# Patient Record
Sex: Female | Born: 1945
Health system: Southern US, Community
[De-identification: ages and names within clinical notes are randomized; demographics above are authoritative.]

## PROBLEM LIST (undated history)

## (undated) DIAGNOSIS — F329 Major depressive disorder, single episode, unspecified: Secondary | ICD-10-CM

## (undated) DIAGNOSIS — M1990 Unspecified osteoarthritis, unspecified site: Secondary | ICD-10-CM

## (undated) DIAGNOSIS — K219 Gastro-esophageal reflux disease without esophagitis: Secondary | ICD-10-CM

## (undated) DIAGNOSIS — E039 Hypothyroidism, unspecified: Secondary | ICD-10-CM

## (undated) DIAGNOSIS — F32A Depression, unspecified: Secondary | ICD-10-CM

## (undated) HISTORY — PX: CYST EXCISION: SHX5701

## (undated) HISTORY — PX: THYROID LOBECTOMY: SHX420

---

## 2001-05-22 ENCOUNTER — Encounter: Admission: RE | Admit: 2001-05-22 | Discharge: 2001-05-22 | Payer: Self-pay | Admitting: Family Medicine

## 2001-05-22 ENCOUNTER — Encounter: Payer: Self-pay | Admitting: Family Medicine

## 2002-05-31 ENCOUNTER — Encounter: Payer: Self-pay | Admitting: Orthopedic Surgery

## 2002-05-31 ENCOUNTER — Encounter: Admission: RE | Admit: 2002-05-31 | Discharge: 2002-05-31 | Payer: Self-pay | Admitting: Orthopedic Surgery

## 2002-07-13 ENCOUNTER — Encounter: Payer: Self-pay | Admitting: Family Medicine

## 2002-07-13 ENCOUNTER — Ambulatory Visit (HOSPITAL_COMMUNITY): Admission: RE | Admit: 2002-07-13 | Discharge: 2002-07-13 | Payer: Self-pay | Admitting: Family Medicine

## 2002-07-13 ENCOUNTER — Encounter (INDEPENDENT_AMBULATORY_CARE_PROVIDER_SITE_OTHER): Payer: Self-pay | Admitting: *Deleted

## 2002-09-23 ENCOUNTER — Encounter: Payer: Self-pay | Admitting: General Surgery

## 2002-09-29 ENCOUNTER — Encounter (INDEPENDENT_AMBULATORY_CARE_PROVIDER_SITE_OTHER): Payer: Self-pay | Admitting: *Deleted

## 2002-09-29 ENCOUNTER — Observation Stay (HOSPITAL_COMMUNITY): Admission: RE | Admit: 2002-09-29 | Discharge: 2002-09-30 | Payer: Self-pay | Admitting: General Surgery

## 2003-03-03 ENCOUNTER — Ambulatory Visit (HOSPITAL_COMMUNITY): Admission: RE | Admit: 2003-03-03 | Discharge: 2003-03-03 | Payer: Self-pay | Admitting: Family Medicine

## 2006-10-08 ENCOUNTER — Encounter: Admission: RE | Admit: 2006-10-08 | Discharge: 2006-10-08 | Payer: Self-pay | Admitting: Family Medicine

## 2010-06-01 NOTE — H&P (Signed)
NAME:  Joanne Reese, Joanne Reese                     ACCOUNT NO.:  [id number]   MEDICAL RECORD NO.:  [id number]                   PATIENT TYPE:  AMB   LOCATION:  DAY                                  FACILITY:  [facility]   PHYSICIAN:  Anselm Pancoast. Zachery Dakins, M.D.          DATE OF BIRTH:  07-Jul-1945   DATE OF ADMISSION:  09/29/2002  DATE OF DISCHARGE:                                HISTORY & PHYSICAL   CHIEF COMPLAINT:  Right thyroid nodule.   HISTORY:  Joanne Reese is a 65 year old Caucasian female referred to me  by Dr. Smith Mince after she had presented there approximately two months  earlier with an enlarge lymph node in the submandibular area and was  concerned about this because she has a sister who has had a multiple myeloma  that originated with a neck mass, and then she has also had a brother  recently that died with a malignancy that started in the lymph nodes.  Dr.  Smith Mince saw the patient and according to the patient, did not feel  anything definitely abnormal, but when ahead and ordered MRI and laboratory  studies.  The MRI done subsequently did not show any enlarged lymph nodes  but it did show a definite mass within the thyroid.  Then she had a thyroid  scan which showed a cold nodule in the right thyroid.  She also had an  ultrasound of the thyroid and then an aspiration cytology was performed,  fine needle, that showed __________ cells.  I saw the patient about the  midportion of July and discussed with her that with this finding she would  need a thyroid surgery.  She, because of work responsibilities and also some  family responsibilities, desired to wait until this time and was scheduled  electively.  The patient has had no history of radiation exposure to her  neck.  Otherwise, has been in good health __________.   REVIEW OF SYSTEMS:  Essentially negative.   PAST SURGICAL HISTORY:  She has had a previous cyst on her ovary removed.   ALLERGIES:  Says that she has had  an allergic reaction to MACROBID.   MEDICATIONS:  She is on an anti-inflammatory medicine for fasciitis.    The details as far as her past review of systems, etc., are all enclosed in  the chart from our office.   PHYSICAL EXAMINATION:  GENERAL:  The patient is an average sized, pleasant  Caucasian female in no acute distress.  VITAL SIGNS:  Temperature 98.1, pulse 64, respirations 16, blood pressure  108/64.  HEENT:  Appears adequately hydrated.  Tongue midline.  No oropharyngeal  lesions.  NECK:  No cervical or supraclavicular lymphadenopathy.  Can feel a  prominence in the right lobe of the thyroid.  There is not any enlarged  lymph nodes that I can appreciate.  LUNGS:  Clear.  BREASTS:  Normal.  CARDIAC:  Normal sinus rhythm.  ABDOMEN:  I do not appreciate  any abdominal organomegaly.   IMPRESSION:  Neoplasm right lobe of the thyroid, hopefully benign and  possibly a Hashimoto's thyroiditis according to the cytology reports.  I  discussed this with Dr. Clelia Croft.   PLAN:  The patient understands that we will do a right thyroid lobectomy.  She is aware of the recurrent laryngeal nerve and function of the  parathyroid glands because of her sister's total thyroidectomy and the  problems with calcium metabolism postoperatively, but is doing  satisfactorily now.  The patient will be given 1 g of Kefzol and she is  aware that she will be placed on thyroid replacement postoperatively  regardless of the pathology findings.  Hopefully it will not be necessary to  do a total thyroidectomy.                                               Anselm Pancoast. Zachery Dakins, M.D.    WJW/MEDQ  D:  09/29/2002  T:  09/29/2002  Job:  [id number]

## 2010-06-01 NOTE — Op Note (Signed)
NAME:  Joanne Reese, Joanne Reese                     ACCOUNT NO.:  [id number]   MEDICAL RECORD NO.:  [id number]                   PATIENT TYPE:  AMB   LOCATION:  DAY                                  FACILITY:  [facility]   PHYSICIAN:  Anselm Pancoast. Zachery Dakins, M.D.          DATE OF BIRTH:  1945-04-28   DATE OF PROCEDURE:  09/29/2002  DATE OF DISCHARGE:                                 OPERATIVE REPORT   PREOPERATIVE DIAGNOSIS:  Right thyroid nodule, possibly a Hurthle cell  tumor.   POSTOPERATIVE DIAGNOSES:  1. Right thyroid Hurthle cell tumor, benign.  2. Probable Hashimoto's thyroiditis.   OPERATION:  Right thyroid lobectomy.   General anesthesia.   SURGEON:  Anselm Pancoast. Zachery Dakins, M.D.   ASSISTANT:  Gita Kudo, M.D.   HISTORY:  Joanne Reese is a 65 year old female referred to me by  Talmadge Coventry, M.D., for enlargement of her right thyroid gland.  She  grew up in [place] state, is followed by Dr. Smith Mince, and recently she  noticed a little lump in her neck.  Her family history is significant in  that a brother recently died with problems of a lymphoma, and she also has a  sister that had multiple myeloma which presented with swollen lymph nodes,  and she saw Dr. Smith Mince when she felt the little node in the left  submandibular area, and Dr. Smith Mince said that she could not feel anything  definitely abnormal but ordered an MRI of the head and neck, and this showed  an enlarged thyroid mass.  Then she had a thyroid scan and thyroid  ultrasound, which showed a mass of the right lobe of the thyroid, and then  she was referred for an aspiration biopsy, and this was read by Berneta Levins,  M.D., that appeared to be Hurthle cells.  I then saw the patient and this  was about a month ago, and on physical exam you could definitely feel a firm  mass of the right lobe of her thyroid.  It was not very prominent but  definitely abnormal.  I did not appreciate any large lymph nodes,  and I  discussed with the patient with these findings that we should proceed on  with a right thyroid lobectomy.  I discussed this with Dr. Clelia Croft, and she  thought it was very unlikely that this would be anything like a lymphoma, as  the sister's surgery for original thyroid mass had presented.  The patient  had family responsibilities and was it was postponed now for the planned  surgery.  We discussed with her about placing her on thyroid replacement,  but I do not think she has actually been started on it at this time.   The patient was taken to the operative suite.  We positioned her on the OR  table after induction of general anesthesia with endotracheal tube, I had  given her 1 g of Kefzol, and I had placed a  roll up under the upper back.  I  made sure her neck was symmetrical, and then she was draped in a sterile  manner after prep of the neck with Betadine surgical scrub and solution and  drape in a sterile manner.  I marked the skin about two fingerbreadths above  the manubrium.  It appears that her gland is a little higher than some, and  the incision is maybe a little bit higher than some.  The skin incision was  opened about two inches, sharp dissection through the skin and subcutaneous  and the platysma, and this was elevated from the strap muscles, and a few  little bleeders were either cauterized or some were tied or ligated with  fine Vicryl sutures.  Then a Mahorner retractor was placed.  We then  separated the strap muscles in the midline and noted that the right lobe  was, the isthmus was a little bit to the left of midline and the mass was  obviously kind of in the body in the right pole but kind of going posterior.  I first noted that on trying to separate the thyroid from the surrounding  structures, there was a lot more sticky or kind of a more fibrosis than  usual, but we continued kind of separating everything carefully.  The little  blood vessels were either  ligated with fine silk or possibly fine Vicryl, a  few of them actually with small hemoclips.  I then basically separated the  superior pole structures.  This was doubly ligated distally with 2-0 silk,  inferior with single 2-0 silk, and then the inferior lobe, we saw the  branches coming off the inferior thyroid artery as they went right into the  thyroid gland, and this was divided at this level.  The inferior parathyroid  gland was identified and as we were dissecting and identifying this, we then  noted the right recurrent laryngeal nerve.  We then rolling the thyroid  gland, I had switched to the left side for better exposure, and we continued  kind of rolling the gland, carefully dissecting with the inferior  parathyroid gland exposed and then identified the superior, and then with  making sure that the right recurrent laryngeal nerve was not being injured,  kind of separated the gland from the structures and followed the nerve going  to the horn of the larynx.  There was, basically you just could not peel it  away, and we carefully dissected and careful hemostasis, and then we went  ahead and divided the isthmus, and I tied the left with a 2-0 Vicryl suture  and then a second tie, a free tie.  Then with this, the isthmus divided,  sort of elevating the thyroid from the larynx and carefully freed it.  The  mass itself was probably about 2-3 cm in size, and it was well within the  lobe, and this was read by Dr. Laureen Ochs, and he says it is a Hurthle cell tumor  but thinks it is benign, but it does appear that she has Hashimoto's  thyroiditis in the surrounding thyroid tissue.  The left thyroid lobe we  elevated the strap muscles over it.  It does not have the inflammation  surrounding it like the right, and the portion of the lobe that we could  easily feel, no definite nodularity, and it looked normal on the scans.  Therefore, with a benign Hurthle cell diagnosis and a normal-appearing  gland and the test had  been normal, we elected not to do a left thyroid lobectomy.  Good hemostasis had been obtained.  I reinspected the area.  I then closed  the strap muscle with interrupted sutures of 4-0 Vicryl, the platysma was  closed with interrupted 4-0 Vicryl, and 4-0 Monocryl subcuticular  interrupted stitches and then Steri-Strips on the skin.  The patient  tolerated the procedure nicely and the sterile occlusive dressing was  applied and she was taken to the recovery room in stable postoperative  condition, extubated.  Hopefully, she will have no problems with her voice,  and calcium should be fine, and we will start her on Synthroid replacement.                                                 Anselm Pancoast. Zachery Dakins, M.D.    WJW/MEDQ  D:  09/29/2002  T:  09/29/2002  Job:  [id number]   cc:   Talmadge Coventry, M.D.  [address]  [place]  [place] [address]  Fax: [phone]

## 2013-02-02 LAB — HM COLONOSCOPY

## 2013-12-01 ENCOUNTER — Other Ambulatory Visit: Payer: Self-pay | Admitting: Orthopedic Surgery

## 2013-12-01 DIAGNOSIS — M25562 Pain in left knee: Secondary | ICD-10-CM

## 2013-12-01 DIAGNOSIS — R531 Weakness: Secondary | ICD-10-CM

## 2013-12-01 DIAGNOSIS — M48061 Spinal stenosis, lumbar region without neurogenic claudication: Secondary | ICD-10-CM

## 2013-12-06 ENCOUNTER — Ambulatory Visit
Admission: RE | Admit: 2013-12-06 | Discharge: 2013-12-06 | Disposition: A | Discharge status: Home / Self Care | Payer: 59 | Source: Ambulatory Visit | Attending: Orthopedic Surgery | Admitting: Orthopedic Surgery

## 2013-12-06 DIAGNOSIS — M48061 Spinal stenosis, lumbar region without neurogenic claudication: Secondary | ICD-10-CM

## 2013-12-06 DIAGNOSIS — M25562 Pain in left knee: Secondary | ICD-10-CM

## 2013-12-06 DIAGNOSIS — R531 Weakness: Secondary | ICD-10-CM

## 2014-06-14 DIAGNOSIS — E0590 Thyrotoxicosis, unspecified without thyrotoxic crisis or storm: Secondary | ICD-10-CM | POA: Diagnosis not present

## 2014-06-21 DIAGNOSIS — E890 Postprocedural hypothyroidism: Secondary | ICD-10-CM | POA: Diagnosis not present

## 2014-06-21 DIAGNOSIS — Z808 Family history of malignant neoplasm of other organs or systems: Secondary | ICD-10-CM | POA: Diagnosis not present

## 2014-06-28 DIAGNOSIS — M25562 Pain in left knee: Secondary | ICD-10-CM | POA: Diagnosis not present

## 2014-06-28 DIAGNOSIS — E780 Pure hypercholesterolemia: Secondary | ICD-10-CM | POA: Diagnosis not present

## 2014-06-28 DIAGNOSIS — R030 Elevated blood-pressure reading, without diagnosis of hypertension: Secondary | ICD-10-CM | POA: Diagnosis not present

## 2014-06-28 DIAGNOSIS — R7309 Other abnormal glucose: Secondary | ICD-10-CM | POA: Diagnosis not present

## 2014-06-28 DIAGNOSIS — M85852 Other specified disorders of bone density and structure, left thigh: Secondary | ICD-10-CM | POA: Diagnosis not present

## 2014-06-28 DIAGNOSIS — E039 Hypothyroidism, unspecified: Secondary | ICD-10-CM | POA: Diagnosis not present

## 2014-06-28 DIAGNOSIS — Z13820 Encounter for screening for osteoporosis: Secondary | ICD-10-CM | POA: Diagnosis not present

## 2014-07-11 DIAGNOSIS — M25562 Pain in left knee: Secondary | ICD-10-CM | POA: Diagnosis not present

## 2014-11-10 DIAGNOSIS — R030 Elevated blood-pressure reading, without diagnosis of hypertension: Secondary | ICD-10-CM | POA: Diagnosis not present

## 2014-11-10 DIAGNOSIS — E039 Hypothyroidism, unspecified: Secondary | ICD-10-CM | POA: Diagnosis not present

## 2014-11-10 DIAGNOSIS — Z1389 Encounter for screening for other disorder: Secondary | ICD-10-CM | POA: Diagnosis not present

## 2014-11-10 DIAGNOSIS — F430 Acute stress reaction: Secondary | ICD-10-CM | POA: Diagnosis not present

## 2014-12-27 DIAGNOSIS — Z1231 Encounter for screening mammogram for malignant neoplasm of breast: Secondary | ICD-10-CM | POA: Diagnosis not present

## 2015-02-01 DIAGNOSIS — Z0000 Encounter for general adult medical examination without abnormal findings: Secondary | ICD-10-CM | POA: Diagnosis not present

## 2015-02-01 DIAGNOSIS — Z23 Encounter for immunization: Secondary | ICD-10-CM | POA: Diagnosis not present

## 2015-02-01 DIAGNOSIS — E039 Hypothyroidism, unspecified: Secondary | ICD-10-CM | POA: Diagnosis not present

## 2015-02-01 DIAGNOSIS — E559 Vitamin D deficiency, unspecified: Secondary | ICD-10-CM | POA: Diagnosis not present

## 2015-02-01 DIAGNOSIS — E785 Hyperlipidemia, unspecified: Secondary | ICD-10-CM | POA: Diagnosis not present

## 2015-02-01 DIAGNOSIS — R030 Elevated blood-pressure reading, without diagnosis of hypertension: Secondary | ICD-10-CM | POA: Diagnosis not present

## 2015-02-01 DIAGNOSIS — R7309 Other abnormal glucose: Secondary | ICD-10-CM | POA: Diagnosis not present

## 2015-02-07 DIAGNOSIS — J069 Acute upper respiratory infection, unspecified: Secondary | ICD-10-CM | POA: Diagnosis not present

## 2015-02-09 DIAGNOSIS — H2513 Age-related nuclear cataract, bilateral: Secondary | ICD-10-CM | POA: Diagnosis not present

## 2015-02-15 DIAGNOSIS — Z01419 Encounter for gynecological examination (general) (routine) without abnormal findings: Secondary | ICD-10-CM | POA: Diagnosis not present

## 2015-02-15 DIAGNOSIS — Z809 Family history of malignant neoplasm, unspecified: Secondary | ICD-10-CM | POA: Diagnosis not present

## 2015-02-24 DIAGNOSIS — R51 Headache: Secondary | ICD-10-CM | POA: Diagnosis not present

## 2015-03-01 ENCOUNTER — Other Ambulatory Visit: Payer: Self-pay | Admitting: Internal Medicine

## 2015-03-01 DIAGNOSIS — R519 Headache, unspecified: Secondary | ICD-10-CM

## 2015-03-01 DIAGNOSIS — R51 Headache: Principal | ICD-10-CM

## 2015-03-10 ENCOUNTER — Ambulatory Visit
Admission: RE | Admit: 2015-03-10 | Discharge: 2015-03-10 | Disposition: A | Discharge status: Home / Self Care | Payer: Self-pay | Source: Ambulatory Visit | Attending: Internal Medicine | Admitting: Internal Medicine

## 2015-03-10 DIAGNOSIS — R51 Headache: Principal | ICD-10-CM

## 2015-03-10 DIAGNOSIS — R519 Headache, unspecified: Secondary | ICD-10-CM

## 2015-05-15 DIAGNOSIS — E039 Hypothyroidism, unspecified: Secondary | ICD-10-CM | POA: Diagnosis not present

## 2015-05-15 DIAGNOSIS — R030 Elevated blood-pressure reading, without diagnosis of hypertension: Secondary | ICD-10-CM | POA: Diagnosis not present

## 2015-05-15 DIAGNOSIS — R42 Dizziness and giddiness: Secondary | ICD-10-CM | POA: Diagnosis not present

## 2015-05-15 DIAGNOSIS — R7309 Other abnormal glucose: Secondary | ICD-10-CM | POA: Diagnosis not present

## 2015-06-27 DIAGNOSIS — E890 Postprocedural hypothyroidism: Secondary | ICD-10-CM | POA: Diagnosis not present

## 2015-06-29 DIAGNOSIS — Z808 Family history of malignant neoplasm of other organs or systems: Secondary | ICD-10-CM | POA: Diagnosis not present

## 2015-06-29 DIAGNOSIS — E890 Postprocedural hypothyroidism: Secondary | ICD-10-CM | POA: Diagnosis not present

## 2015-07-20 DIAGNOSIS — E785 Hyperlipidemia, unspecified: Secondary | ICD-10-CM | POA: Diagnosis not present

## 2015-07-20 DIAGNOSIS — M25562 Pain in left knee: Secondary | ICD-10-CM | POA: Diagnosis not present

## 2015-07-20 DIAGNOSIS — R030 Elevated blood-pressure reading, without diagnosis of hypertension: Secondary | ICD-10-CM | POA: Diagnosis not present

## 2015-07-20 DIAGNOSIS — Z79899 Other long term (current) drug therapy: Secondary | ICD-10-CM | POA: Diagnosis not present

## 2015-10-02 DIAGNOSIS — Z23 Encounter for immunization: Secondary | ICD-10-CM | POA: Diagnosis not present

## 2015-12-21 DIAGNOSIS — R599 Enlarged lymph nodes, unspecified: Secondary | ICD-10-CM | POA: Diagnosis not present

## 2016-02-20 DIAGNOSIS — R7309 Other abnormal glucose: Secondary | ICD-10-CM | POA: Diagnosis not present

## 2016-02-20 DIAGNOSIS — Z79899 Other long term (current) drug therapy: Secondary | ICD-10-CM | POA: Diagnosis not present

## 2016-02-20 DIAGNOSIS — E559 Vitamin D deficiency, unspecified: Secondary | ICD-10-CM | POA: Diagnosis not present

## 2016-02-20 DIAGNOSIS — E890 Postprocedural hypothyroidism: Secondary | ICD-10-CM | POA: Diagnosis not present

## 2016-02-20 DIAGNOSIS — Z0000 Encounter for general adult medical examination without abnormal findings: Secondary | ICD-10-CM | POA: Diagnosis not present

## 2016-02-27 DIAGNOSIS — Z8041 Family history of malignant neoplasm of ovary: Secondary | ICD-10-CM | POA: Diagnosis not present

## 2016-02-27 DIAGNOSIS — Z01419 Encounter for gynecological examination (general) (routine) without abnormal findings: Secondary | ICD-10-CM | POA: Diagnosis not present

## 2016-02-27 DIAGNOSIS — L03113 Cellulitis of right upper limb: Secondary | ICD-10-CM | POA: Diagnosis not present

## 2016-02-27 DIAGNOSIS — Z780 Asymptomatic menopausal state: Secondary | ICD-10-CM | POA: Diagnosis not present

## 2016-02-27 DIAGNOSIS — Z809 Family history of malignant neoplasm, unspecified: Secondary | ICD-10-CM | POA: Diagnosis not present

## 2016-03-11 DIAGNOSIS — S0083XA Contusion of other part of head, initial encounter: Secondary | ICD-10-CM | POA: Diagnosis not present

## 2016-03-11 DIAGNOSIS — S92812A Other fracture of left foot, initial encounter for closed fracture: Secondary | ICD-10-CM | POA: Diagnosis not present

## 2016-03-15 ENCOUNTER — Ambulatory Visit (INDEPENDENT_AMBULATORY_CARE_PROVIDER_SITE_OTHER): Payer: Medicare Other

## 2016-03-15 ENCOUNTER — Ambulatory Visit (INDEPENDENT_AMBULATORY_CARE_PROVIDER_SITE_OTHER): Payer: Medicare Other | Admitting: Orthopedic Surgery

## 2016-03-15 DIAGNOSIS — M1712 Unilateral primary osteoarthritis, left knee: Secondary | ICD-10-CM

## 2016-03-15 DIAGNOSIS — S92355A Nondisplaced fracture of fifth metatarsal bone, left foot, initial encounter for closed fracture: Secondary | ICD-10-CM | POA: Diagnosis not present

## 2016-03-15 DIAGNOSIS — M25572 Pain in left ankle and joints of left foot: Secondary | ICD-10-CM

## 2016-03-15 MED ORDER — LIDOCAINE HCL 1 % IJ SOLN
5.0000 mL | INTRAMUSCULAR | Status: AC | PRN
  Administered 2016-03-15: 5 mL

## 2016-03-15 MED ORDER — BUPIVACAINE HCL 0.25 % IJ SOLN
4.0000 mL | INTRAMUSCULAR | Status: AC | PRN
  Administered 2016-03-15: 4 mL via INTRA_ARTICULAR

## 2016-03-15 MED ORDER — METHYLPREDNISOLONE ACETATE 40 MG/ML IJ SUSP
40.0000 mg | INTRAMUSCULAR | Status: AC | PRN
  Administered 2016-03-15: 40 mg via INTRA_ARTICULAR

## 2016-03-15 NOTE — Progress Notes (Signed)
Office Visit Note   Patient: Joanne Reese           Date of Birth: 10/16/1945           MRN: [id number] Visit Date: 03/15/2016 Requested by: Glendale Chard, MD [address] [place], [place] [address] PCP: [name], MD  Subjective: No chief complaint on file.   HPI: Joanne Reese is a 71 year old patient who was standing on a rolling chair and fell 6 days ago.  Face became bruised but her foot was evaluated after she had difficulty weightbearing and she was found to have a fifth metatarsal fracture.  She's been walking in a cam boot.  Taking Advil for pain.  She also describes left knee pain.  She's getting ready to visit her son in [place] on the 20th.  She presents now for further evaluation and management.              ROS: All systems reviewed are negative as they relate to the chief complaint within the history of present illness.  Patient denies  fevers or chills.   Assessment & Plan: Visit Diagnoses:  1. Pain in left ankle and joints of left foot     Plan: Impression is left knee arthritis.  Injection performed today.  She also has minimally displaced fifth metatarsal fracture which should respond well to continue nonoperative management.  Continue to weight-bear in a relate as tolerated in the fracture boot.  When out of the fracture boot she should not really put any weight on that distal foot.  I see her back on the 19th with repeat radiographs possible changeover to harder sole shoes at that time.  Follow-Up Instructions: No Follow-up on file.   Orders:  Orders Placed This Encounter  Procedures  . XR Foot Complete Left   No orders of the defined types were placed in this encounter.     Procedures: Large Joint Inj Date/Time: 03/15/2016 9:09 AM Performed by: Meredith Pel Authorized by: Meredith Pel   Consent Given by:  Patient Site marked: the procedure site was marked   Timeout: prior to procedure the correct patient, procedure,  and site was verified   Indications:  Pain, joint swelling and diagnostic evaluation Location:  Knee Site:  L knee Prep: patient was prepped and draped in usual sterile fashion   Needle Size:  18 G Needle Length:  1.5 inches Approach:  Superolateral Ultrasound Guidance: No   Fluoroscopic Guidance: No   Arthrogram: No   Medications:  5 mL lidocaine 1 %; 4 mL bupivacaine 0.25 %; 40 mg methylPREDNISolone acetate 40 MG/ML Patient tolerance:  Patient tolerated the procedure well with no immediate complications     Clinical Data: No additional findings.  Objective: Vital Signs: There were no vitals taken for this visit.  Physical Exam:   Constitutional: Patient appears well-developed HEENT:  Head: Normocephalic Eyes:EOM are normal Neck: Normal range of motion Cardiovascular: Normal rate Pulmonary/chest: Effort normal Neurologic: Patient is alert Skin: Skin is warm Psychiatric: Patient has normal mood and affect    Ortho Exam: Orthopedic exam demonstrates some bruising and tenderness at the fifth metatarsal region on the left foot.  Pedal pulses palpable.  Ankle dorsi and plantar flexion is intact.  No other masses lymph adenopathy or skin changes noted in the left foot region.  Compartments otherwise normal.  No tenderness at the tarsometatarsal junction.  Specialty Comments:  No specialty comments available.  Imaging: Xr Foot Complete Left  Result  Date: 03/15/2016 3 views left foot reviewed.  Spurring off the calcaneus noted on the plantar surface.  Mild degenerative changes in the midfoot present.  Fifth metatarsal fracture present with minimal displacement.  Spiral type fracture extending to the head and neck region of the fifth metatarsal.  Lisfranc joint remains intact and nondisplaced    PMFS History: There are no active problems to display for this patient.  No past medical history on file.  No family history on file.  No past surgical history on file. Social  History   Occupational History  . Not on file.   Social History Main Topics  . Smoking status: Not on file  . Smokeless tobacco: Not on file  . Alcohol use Not on file  . Drug use: Unknown  . Sexual activity: Not on file

## 2016-03-19 DIAGNOSIS — M8588 Other specified disorders of bone density and structure, other site: Secondary | ICD-10-CM | POA: Diagnosis not present

## 2016-03-19 DIAGNOSIS — Z780 Asymptomatic menopausal state: Secondary | ICD-10-CM | POA: Diagnosis not present

## 2016-03-19 DIAGNOSIS — Z1231 Encounter for screening mammogram for malignant neoplasm of breast: Secondary | ICD-10-CM | POA: Diagnosis not present

## 2016-03-20 ENCOUNTER — Ambulatory Visit (INDEPENDENT_AMBULATORY_CARE_PROVIDER_SITE_OTHER): Payer: Self-pay | Admitting: Orthopedic Surgery

## 2016-04-01 ENCOUNTER — Ambulatory Visit (INDEPENDENT_AMBULATORY_CARE_PROVIDER_SITE_OTHER): Payer: Medicare Other

## 2016-04-01 ENCOUNTER — Encounter (INDEPENDENT_AMBULATORY_CARE_PROVIDER_SITE_OTHER): Payer: Self-pay | Admitting: Orthopedic Surgery

## 2016-04-01 ENCOUNTER — Ambulatory Visit (INDEPENDENT_AMBULATORY_CARE_PROVIDER_SITE_OTHER): Payer: Medicare Other | Admitting: Orthopedic Surgery

## 2016-04-01 DIAGNOSIS — M79672 Pain in left foot: Secondary | ICD-10-CM

## 2016-04-01 DIAGNOSIS — N839 Noninflammatory disorder of ovary, fallopian tube and broad ligament, unspecified: Secondary | ICD-10-CM | POA: Diagnosis not present

## 2016-04-01 DIAGNOSIS — Z8041 Family history of malignant neoplasm of ovary: Secondary | ICD-10-CM | POA: Diagnosis not present

## 2016-04-01 DIAGNOSIS — Z780 Asymptomatic menopausal state: Secondary | ICD-10-CM | POA: Diagnosis not present

## 2016-04-01 DIAGNOSIS — S92355D Nondisplaced fracture of fifth metatarsal bone, left foot, subsequent encounter for fracture with routine healing: Secondary | ICD-10-CM

## 2016-04-01 NOTE — Progress Notes (Signed)
   Post-Op Visit Note   Patient: Joanne Reese           Date of Birth: Sep 10, 1945           MRN: [id number] Visit Date: 04/01/2016 PCP: [name], MD   Assessment & Plan:  Chief Complaint:  Chief Complaint  Patient presents with  . Left Foot - Follow-up   Visit Diagnoses:  1. Left foot pain   2. Closed nondisplaced fracture of fifth metatarsal bone of left foot with routine healing, subsequent encounter     Plan: Joanne Reese is a patient who underwent left fifth metatarsal fracture several weeks ago.  She's been a relating with the fracture boot.  Doing well no problems the pain.  Radiographs of the left foot show no change in fracture alignment.  She's doing well following her knee injection 3-18.  Plan at this time 2 weeks in the boot 1 week in the shoe 3 weeks return if she has any recurrent symptoms  Follow-Up Instructions: No Follow-up on file.   Orders:  Orders Placed This Encounter  Procedures  . XR Foot Complete Left   No orders of the defined types were placed in this encounter.   Imaging: Xr Foot Complete Left  Result Date: 04/01/2016   Fifth metatarsal fracture has minimal to no change in fracture alignment.  Small amount of callus formation is present.  Overall alignment unchanged from previous x-rays   PMFS History: There are no active problems to display for this patient.  No past medical history on file.  No family history on file.  No past surgical history on file. Social History   Occupational History  . Not on file.   Social History Main Topics  . Smoking status: Never Smoker  . Smokeless tobacco: Never Used  . Alcohol use Not on file  . Drug use: Unknown  . Sexual activity: Not on file

## 2016-04-04 DIAGNOSIS — R05 Cough: Secondary | ICD-10-CM | POA: Diagnosis not present

## 2016-04-04 DIAGNOSIS — R911 Solitary pulmonary nodule: Secondary | ICD-10-CM | POA: Diagnosis not present

## 2016-04-04 DIAGNOSIS — J208 Acute bronchitis due to other specified organisms: Secondary | ICD-10-CM | POA: Diagnosis not present

## 2016-04-22 DIAGNOSIS — N6312 Unspecified lump in the right breast, upper inner quadrant: Secondary | ICD-10-CM | POA: Diagnosis not present

## 2016-04-22 DIAGNOSIS — N6310 Unspecified lump in the right breast, unspecified quadrant: Secondary | ICD-10-CM | POA: Diagnosis not present

## 2016-04-22 DIAGNOSIS — N6341 Unspecified lump in right breast, subareolar: Secondary | ICD-10-CM | POA: Diagnosis not present

## 2016-04-22 DIAGNOSIS — N6322 Unspecified lump in the left breast, upper inner quadrant: Secondary | ICD-10-CM | POA: Diagnosis not present

## 2016-04-22 DIAGNOSIS — N6489 Other specified disorders of breast: Secondary | ICD-10-CM | POA: Diagnosis not present

## 2016-04-22 DIAGNOSIS — N6320 Unspecified lump in the left breast, unspecified quadrant: Secondary | ICD-10-CM | POA: Diagnosis not present

## 2016-04-22 DIAGNOSIS — N6002 Solitary cyst of left breast: Secondary | ICD-10-CM | POA: Diagnosis not present

## 2016-04-30 DIAGNOSIS — M9902 Segmental and somatic dysfunction of thoracic region: Secondary | ICD-10-CM | POA: Diagnosis not present

## 2016-04-30 DIAGNOSIS — M9904 Segmental and somatic dysfunction of sacral region: Secondary | ICD-10-CM | POA: Diagnosis not present

## 2016-04-30 DIAGNOSIS — M9905 Segmental and somatic dysfunction of pelvic region: Secondary | ICD-10-CM | POA: Diagnosis not present

## 2016-04-30 DIAGNOSIS — M9903 Segmental and somatic dysfunction of lumbar region: Secondary | ICD-10-CM | POA: Diagnosis not present

## 2016-06-24 DIAGNOSIS — N839 Noninflammatory disorder of ovary, fallopian tube and broad ligament, unspecified: Secondary | ICD-10-CM | POA: Diagnosis not present

## 2016-06-24 DIAGNOSIS — Z8041 Family history of malignant neoplasm of ovary: Secondary | ICD-10-CM | POA: Diagnosis not present

## 2016-07-01 DIAGNOSIS — M25561 Pain in right knee: Secondary | ICD-10-CM | POA: Diagnosis not present

## 2016-07-01 DIAGNOSIS — E039 Hypothyroidism, unspecified: Secondary | ICD-10-CM | POA: Diagnosis not present

## 2016-07-01 DIAGNOSIS — E7800 Pure hypercholesterolemia, unspecified: Secondary | ICD-10-CM | POA: Diagnosis not present

## 2016-07-01 DIAGNOSIS — M1711 Unilateral primary osteoarthritis, right knee: Secondary | ICD-10-CM | POA: Diagnosis not present

## 2016-07-01 DIAGNOSIS — Z79899 Other long term (current) drug therapy: Secondary | ICD-10-CM | POA: Diagnosis not present

## 2016-07-01 DIAGNOSIS — Z7982 Long term (current) use of aspirin: Secondary | ICD-10-CM | POA: Diagnosis not present

## 2016-07-02 DIAGNOSIS — R2241 Localized swelling, mass and lump, right lower limb: Secondary | ICD-10-CM | POA: Diagnosis not present

## 2016-07-05 ENCOUNTER — Encounter (INDEPENDENT_AMBULATORY_CARE_PROVIDER_SITE_OTHER): Payer: Self-pay | Admitting: Orthopaedic Surgery

## 2016-07-05 ENCOUNTER — Ambulatory Visit (INDEPENDENT_AMBULATORY_CARE_PROVIDER_SITE_OTHER): Payer: Medicare Other | Admitting: Orthopaedic Surgery

## 2016-07-05 DIAGNOSIS — M25561 Pain in right knee: Secondary | ICD-10-CM

## 2016-07-05 NOTE — Addendum Note (Signed)
Addended byLaurann Montana on: 07/05/2016 09:41 AM   Modules accepted: Orders

## 2016-07-05 NOTE — Progress Notes (Signed)
Office Visit Note   Patient: Joanne Reese           Date of Birth: 02/16/1945           MRN: [id number] Visit Date: 07/05/2016              Requested by: Glendale Chard, [address] [place], [place] [address] PCP: Glendale Chard, MD   Assessment & Plan: Visit Diagnoses:  1. Acute pain of right knee     Plan: Right knee was aspirated and injected with cortisone. The fluid was sent off for analysis. If not better should consider MRI. Overall impression is degenerative meniscus tear.  Follow-Up Instructions: Return if symptoms worsen or fail to improve.   Orders:  No orders of the defined types were placed in this encounter.  No orders of the defined types were placed in this encounter.     Procedures: Large Joint Inj Date/Time: 07/05/2016 9:28 AM Performed by: Leandrew Koyanagi Authorized by: Leandrew Koyanagi   Consent Given by:  Patient Timeout: prior to procedure the correct patient, procedure, and site was verified   Indications:  Pain Location:  Knee Site:  R knee Prep: patient was prepped and draped in usual sterile fashion   Needle Size:  22 G Ultrasound Guidance: No   Fluoroscopic Guidance: No   Arthrogram: No   Aspirate amount (mL):  40 Patient tolerance:  Patient tolerated the procedure well with no immediate complications     Clinical Data: No additional findings.   Subjective: Chief Complaint  Patient presents with  . Right Knee - Pain    Joanne Reese comes in with right knee swelling and pain for last week or so. She was up in [place]. Had trouble walking. She is to the ER was ruled out for DVT. She did have a Baker's cyst. She is complaining of right knee pain that does not radiate. She does endorse swelling. Denies any injuries.    Review of Systems  Constitutional: Negative.   HENT: Negative.   Eyes: Negative.   Respiratory: Negative.   Cardiovascular: Negative.   Endocrine: Negative.   Musculoskeletal: Negative.     Neurological: Negative.   Hematological: Negative.   Psychiatric/Behavioral: Negative.   All other systems reviewed and are negative.    Objective: Vital Signs: There were no vitals taken for this visit.  Physical Exam  Constitutional: She is oriented to person, place, and time. She appears well-developed and well-nourished.  Pulmonary/Chest: Effort normal.  Neurological: She is alert and oriented to person, place, and time.  Skin: Skin is warm. Capillary refill takes less than 2 seconds.  Psychiatric: She has a normal mood and affect. Her behavior is normal. Judgment and thought content normal.  Nursing note and vitals reviewed.   Ortho Exam Right knee exam shows a moderate size effusion. She has painful range of motion. Collaterals and cruciate's are stable.  No significant joint line tenderness. Small Baker cyst Specialty Comments:  No specialty comments available.  Imaging: No results found.   PMFS History: There are no active problems to display for this patient.  No past medical history on file.  No family history on file.  No past surgical history on file. Social History   Occupational History  . Not on file.   Social History Main Topics  . Smoking status: Never Smoker  . Smokeless tobacco: Never Used  . Alcohol use Not on file  . Drug use: Unknown  . Sexual  activity: Not on file

## 2016-07-06 LAB — SYNOVIAL CELL COUNT + DIFF, W/ CRYSTALS
Basophils, %: 0 %
Eosinophils-Synovial: 0 % (ref 0–2)
Lymphocytes-Synovial Fld: 31 % (ref 0–74)
Monocyte/Macrophage: 60 % (ref 0–69)
Neutrophil, Synovial: 9 % (ref 0–24)
SYNOVIOCYTES, %: 0 % (ref 0–15)
WBC, SYNOVIAL: 493 {cells}/uL — AB (ref <150)

## 2016-07-06 NOTE — Progress Notes (Signed)
Please let her know she had a pseudogout flare up.

## 2016-07-07 DIAGNOSIS — R609 Edema, unspecified: Secondary | ICD-10-CM | POA: Diagnosis not present

## 2016-07-09 ENCOUNTER — Encounter (INDEPENDENT_AMBULATORY_CARE_PROVIDER_SITE_OTHER): Payer: Self-pay | Admitting: Orthopedic Surgery

## 2016-07-09 ENCOUNTER — Ambulatory Visit (INDEPENDENT_AMBULATORY_CARE_PROVIDER_SITE_OTHER): Payer: Medicare Other | Admitting: Orthopedic Surgery

## 2016-07-09 DIAGNOSIS — M25561 Pain in right knee: Secondary | ICD-10-CM

## 2016-07-09 MED ORDER — IBUPROFEN-FAMOTIDINE 800-26.6 MG PO TABS: 1.0000 | ORAL_TABLET | Freq: Every day | ORAL | 0 refills | Status: DC

## 2016-07-09 NOTE — Progress Notes (Signed)
   Office Visit Note   Patient: Joanne Reese           Date of Birth: [birth year]           MRN: [id number] Visit Date: 07/09/2016 Requested by: Glendale Chard, [address] [place], [place] [address] PCP: Glendale Chard, MD  Subjective: Chief Complaint  Patient presents with  . Right Knee - Pain    HPI: Joanne Reese is a 71 year old patient with right knee pain.  Last week she saw one of my partners had knee aspirated and injected.  30 mL were removed.  This did show pseudogout.  That acute pain is past but she is left with some medial sided pain consistent with her known history of early arthritis in that right knee.  She takes Advil which helps.  She leaves for [place] on Monday.              ROS: All systems reviewed are negative as they relate to the chief complaint within the history of present illness.  Patient denies  fevers or chills.   Assessment & Plan: Visit Diagnoses:  1. Acute pain of right knee     Plan: Impression is resolved pseudogout attack from cortisone injection but with persistent pain likely from degenerative arthritis affecting primarily the medial compartment.  Plan is for her to take Duexis 1 tablet daily for 9 days all she is over in [place].  Also going to give her a knee sleeve to wear which might help with her ambulatory ability and endurance.  She needs to avoid foods that may be provocative for pseudogout attacks.  I'll see her back as needed  Follow-Up Instructions: Return if symptoms worsen or fail to improve.   Orders:  No orders of the defined types were placed in this encounter.  Meds ordered this encounter  Medications  . Ibuprofen-Famotidine (DUEXIS) 800-26.6 MG TABS    Sig: Take 1 tablet by mouth daily.    Dispense:  90 tablet    Refill:  0      Procedures: No procedures performed   Clinical Data: No additional findings.  Objective: Vital Signs: There were no vitals taken for this visit.  Physical Exam:    Constitutional: Patient appears well-developed HEENT:  Head: Normocephalic Eyes:EOM are normal Neck: Normal range of motion Cardiovascular: Normal rate Pulmonary/chest: Effort normal Neurologic: Patient is alert Skin: Skin is warm Psychiatric: Patient has normal mood and affect    Ortho Exam: Orthopedic exam demonstrates full active and passive range of motion of the right knee.  Collateral and cruciate ligaments are stable.  No effusion is present.  Medial joint line tenderness is present.  Pedal pulses palpable.  Knee has only slight warmth right compared to left.  Negative McMurray compression testing.  Specialty Comments:  No specialty comments available.  Imaging: No results found.   PMFS History: There are no active problems to display for this patient.  No past medical history on file.  No family history on file.  No past surgical history on file. Social History   Occupational History  . Not on file.   Social History Main Topics  . Smoking status: Never Smoker  . Smokeless tobacco: Never Used  . Alcohol use Not on file  . Drug use: Unknown  . Sexual activity: Not on file

## 2016-07-18 ENCOUNTER — Telehealth (INDEPENDENT_AMBULATORY_CARE_PROVIDER_SITE_OTHER): Payer: Self-pay | Admitting: Orthopedic Surgery

## 2016-07-18 NOTE — Telephone Encounter (Signed)
Please advise. Thanks.  

## 2016-07-18 NOTE — Telephone Encounter (Signed)
Patient called advised she is in [place] and pseudo gout and is taking 800 mg of Ibuprofen which taking 1 a day is not working. Patient asked what can she take with it of can she take 1 Ibuprofen every 12 hours. The number to contact patient is [phone]

## 2016-07-18 NOTE — Telephone Encounter (Signed)
S/w patient and advised could take up to additional 800mg  ibuprofen with duexis that she is already taking and she is to make ROV to see Dr Marlou Sa upon return from her trip

## 2016-07-18 NOTE — Telephone Encounter (Signed)
Does she want gout medicine f or additional pain medicine

## 2016-07-18 NOTE — Telephone Encounter (Signed)
Patient called again concerning Rx for pain for pseudogout.

## 2016-08-05 ENCOUNTER — Ambulatory Visit (INDEPENDENT_AMBULATORY_CARE_PROVIDER_SITE_OTHER): Payer: Medicare Other | Admitting: Internal Medicine

## 2016-08-05 ENCOUNTER — Encounter: Payer: Self-pay | Admitting: Internal Medicine

## 2016-08-05 ENCOUNTER — Other Ambulatory Visit: Payer: Self-pay

## 2016-08-05 VITALS — BP 160/98 | HR 84 | Ht 63.0 in | Wt 195.0 lb

## 2016-08-05 DIAGNOSIS — I10 Essential (primary) hypertension: Secondary | ICD-10-CM | POA: Insufficient documentation

## 2016-08-05 DIAGNOSIS — R911 Solitary pulmonary nodule: Secondary | ICD-10-CM | POA: Diagnosis not present

## 2016-08-05 NOTE — Assessment & Plan Note (Signed)
Not optimally controlled on present regimen. I reviewed this with the patient and emphasized importance of follow-up with primary care.     

## 2016-08-05 NOTE — Assessment & Plan Note (Signed)
CT results reviewed with pt >>> Too small for PET or bx, not suspicious enough for excisional bx > really only option for now is follow the Fleischner society guidelines as rec by radiology  Discussed in detail all the  indications, usual  risks and alternatives  relative to the benefits with patient who agrees to proceed with conservative f/u as outlined  =  For symptoms only > no directed therapy indicated    Total time devoted to counseling  > 50 % of initial 45 min office visit:  review case with pt/ discussion of options/alternatives/ personally creating written customized instructions  in presence of pt  then going over those specific  Instructions directly with the pt including how to use all of the meds but in particular covering each new medication in detail and the difference between the maintenance= "automatic" meds and the prns using an action plan format for the latter (If this problem/symptom => do that organization reading Left to right).  Please see AVS from this visit for a full list of these instructions which I personally wrote for this pt and  are unique to this visit.

## 2016-08-05 NOTE — Progress Notes (Signed)
Subjective:     Patient ID: Joanne Reese, female   DOB: 07/15/1945,   MRN: [id number]  HPI  61 yowf never smoker freq travel to [facility] to visit son with onset of cough when boarded in [place] in March 2018  then couldn't stop coughing after arrival to [facility] and so several days after arriving cxr > CTa 04/04/16 due to wearing boot on arrival  > 4 mm so referred to pulmonary clinic 08/05/2016 by Dr   Robbie Lis in [place]   08/05/2016 1st Day Pulmonary office visit/ Joanne Reese   Chief Complaint  Patient presents with  . Pulmonary Consult    Self referral for eval of pulmonary nodule.  Pt states went to ER in [place] in 04/04/16 for eval of cough. CTA was done to r/o PE b/c of recent travel- neg for PE.    rx zpak/ tessalon and cough resolved within a few days   Not limited by breathing from desired activities    No obvious day to day or daytime variability or assoc excess/ purulent sputum or mucus plugs or hemoptysis or cp or chest tightness, subjective wheeze or overt sinus or hb symptoms. No unusual exp hx or h/o childhood pna/ asthma or knowledge of premature birth.  Sleeping ok without nocturnal  or early am exacerbation  of respiratory  c/o's or need for noct saba. Also denies any obvious fluctuation of symptoms with weather or environmental changes or other aggravating or alleviating factors except as outlined above   Current Medications, Allergies, Complete Past Medical History, Past Surgical History, Family History, and Social History were reviewed in [employer] record.  ROS  The following are not active complaints unless bolded sore throat, dysphagia, dental problems, itching, sneezing,  nasal congestion or excess/ purulent secretions, ear ache,   fever, chills, sweats, unintended wt loss, classically pleuritic or exertional cp,  orthopnea pnd or leg swelling, presyncope, palpitations, abdominal pain, anorexia, nausea, vomiting, diarrhea  or change in  bowel or bladder habits, change in stools or urine, dysuria,hematuria,  rash, arthralgias, visual complaints, headache, numbness, weakness or ataxia or problems with walking or coordination,  change in mood/affect since husband died from [place]  or memory.                Review of Systems     Objective:   Physical Exam    amb pleasant wf nad   Wt Readings from Last 3 Encounters:  08/05/16 195 lb (88.5 kg)  12/06/13 192 lb (87.1 kg)  12/06/13 192 lb (87.1 kg)    Vital signs reviewed  Oxygen saturation  98%  RA and note BP 160/98    HEENT: nl dentition, turbinates bilaterally, and oropharynx. Nl external ear canals without cough reflex   NECK :  without JVD/Nodes/TM/ nl carotid upstrokes bilaterally   LUNGS: no acc muscle use,  Nl contour chest which is clear to A and P bilaterally without cough on insp or exp maneuvers   CV:  RRR  no s3 or murmur or increase in P2, and no edema   ABD:  soft and nontender with nl inspiratory excursion in the supine position. No bruits or organomegaly appreciated, bowel sounds nl  MS:  Nl gait/ ext warm without deformities, calf tenderness, cyanosis or clubbing No obvious joint restrictions   SKIN: warm and dry without lesions    NEURO:  alert, approp, nl sensorium with  no motor or cerebellar deficits apparent.  I personally reviewed images and agree with radiology impression as follows:   Chest CTa  04/04/16 16mm LUL     Assessment:

## 2016-08-05 NOTE — Patient Instructions (Signed)
Fleischner Society recommendations for incidental pulmonary nodules for you = no more scans needed  Please call for cough/ chest pain with breathing or unexplained shortness of breath

## 2016-08-07 ENCOUNTER — Ambulatory Visit (INDEPENDENT_AMBULATORY_CARE_PROVIDER_SITE_OTHER): Payer: Medicare Other

## 2016-08-07 ENCOUNTER — Encounter (INDEPENDENT_AMBULATORY_CARE_PROVIDER_SITE_OTHER): Payer: Self-pay | Admitting: Orthopedic Surgery

## 2016-08-07 ENCOUNTER — Ambulatory Visit (INDEPENDENT_AMBULATORY_CARE_PROVIDER_SITE_OTHER): Payer: Medicare Other | Admitting: Orthopedic Surgery

## 2016-08-07 ENCOUNTER — Ambulatory Visit (INDEPENDENT_AMBULATORY_CARE_PROVIDER_SITE_OTHER): Payer: Self-pay

## 2016-08-07 DIAGNOSIS — L813 Cafe au lait spots: Secondary | ICD-10-CM | POA: Diagnosis not present

## 2016-08-07 DIAGNOSIS — D2261 Melanocytic nevi of right upper limb, including shoulder: Secondary | ICD-10-CM | POA: Diagnosis not present

## 2016-08-07 DIAGNOSIS — M25561 Pain in right knee: Secondary | ICD-10-CM | POA: Diagnosis not present

## 2016-08-07 DIAGNOSIS — G8929 Other chronic pain: Secondary | ICD-10-CM

## 2016-08-07 DIAGNOSIS — D692 Other nonthrombocytopenic purpura: Secondary | ICD-10-CM | POA: Diagnosis not present

## 2016-08-07 DIAGNOSIS — D2272 Melanocytic nevi of left lower limb, including hip: Secondary | ICD-10-CM | POA: Diagnosis not present

## 2016-08-07 DIAGNOSIS — L738 Other specified follicular disorders: Secondary | ICD-10-CM | POA: Diagnosis not present

## 2016-08-07 DIAGNOSIS — D2271 Melanocytic nevi of right lower limb, including hip: Secondary | ICD-10-CM | POA: Diagnosis not present

## 2016-08-07 DIAGNOSIS — M25562 Pain in left knee: Secondary | ICD-10-CM

## 2016-08-07 DIAGNOSIS — D225 Melanocytic nevi of trunk: Secondary | ICD-10-CM | POA: Diagnosis not present

## 2016-08-07 DIAGNOSIS — L821 Other seborrheic keratosis: Secondary | ICD-10-CM | POA: Diagnosis not present

## 2016-08-07 DIAGNOSIS — L814 Other melanin hyperpigmentation: Secondary | ICD-10-CM | POA: Diagnosis not present

## 2016-08-09 NOTE — Progress Notes (Signed)
Office Visit Note   Patient: Joanne Reese           Date of Birth: [birth year]           MRN: [id number] Visit Date: 08/07/2016 Requested by: Glendale Chard, [address] [place], [place] [address] PCP: Glendale Chard, MD  Subjective: Chief Complaint  Patient presents with  . Right Knee - Follow-up    HPI: Joanne Reese is a 71 year old patient with right knee pain.  She has been taking 2 axis daily.  She's doing a little bit better than she was in [place].  She is having burning pain in the medial aspect of the right knee.  If she walks for any period time it swells.  Not having much in way of left knee pain.  Support hose that she's been wearing have been painful.  She had a cortisone injection prior to her it [place] trip.  This was done in the right knee.              ROS: All systems reviewed are negative as they relate to the chief complaint within the history of present illness.  Patient denies  fevers or chills.   Assessment & Plan: Visit Diagnoses:  1. Chronic pain of both knees     Plan: Impression is symptomatic right knee medial compartment arthritis with patellofemoral arthritis also noted on plain radiographs.  Patient also has some early lateral compartment arthritis in the left knee.  Plan is to pre-approve for gel injections in both knees.  We will see how that works for her.  I think she is heading for some type of arthroplasty procedure in the right knee.  She does have global pain in the right knee so it would likely be a total knee replacement.  Follow-Up Instructions: No Follow-up on file.   Orders:  Orders Placed This Encounter  Procedures  . XR KNEE 3 VIEW RIGHT  . XR KNEE 3 VIEW LEFT   No orders of the defined types were placed in this encounter.     Procedures: No procedures performed   Clinical Data: No additional findings.  Objective: Vital Signs: There were no vitals taken for this visit.  Physical Exam:   Constitutional:  Patient appears well-developed HEENT:  Head: Normocephalic Eyes:EOM are normal Neck: Normal range of motion Cardiovascular: Normal rate Pulmonary/chest: Effort normal Neurologic: Patient is alert Skin: Skin is warm Psychiatric: Patient has normal mood and affect    Ortho Exam: Orthopedic exam demonstrates somewhat antalgic gait to the right with no effusion in the right knee.  No effusion left knee.  Patient has about a 5 flexion contracture on the right compared to straight leg on the left.  Pedal pulses palpable.  Extensor mechanism is intact bilaterally.  No groin pain with internal rotation of either leg.  Medial greater than lateral joint line tenderness is present.  No other masses lymph adenopathy or skin changes noted in the right knee region.  Specialty Comments:  No specialty comments available.  Imaging: No results found.   PMFS History: Patient Active Problem List   Diagnosis Date Noted  . Solitary pulmonary nodule on lung CT 08/05/2016  . Essential hypertension 08/05/2016   No past medical history on file.  No family history on file.  No past surgical history on file. Social History   Occupational History  . Not on file.   Social History Main Topics  . Smoking status: Never Smoker  . Smokeless tobacco:  Never Used  . Alcohol use Not on file  . Drug use: Unknown  . Sexual activity: Not on file

## 2016-08-19 ENCOUNTER — Encounter (INDEPENDENT_AMBULATORY_CARE_PROVIDER_SITE_OTHER): Payer: Self-pay | Admitting: Orthopedic Surgery

## 2016-08-19 ENCOUNTER — Ambulatory Visit (INDEPENDENT_AMBULATORY_CARE_PROVIDER_SITE_OTHER): Payer: Medicare Other | Admitting: Orthopedic Surgery

## 2016-08-19 DIAGNOSIS — M170 Bilateral primary osteoarthritis of knee: Secondary | ICD-10-CM | POA: Diagnosis not present

## 2016-08-19 DIAGNOSIS — E039 Hypothyroidism, unspecified: Secondary | ICD-10-CM | POA: Diagnosis not present

## 2016-08-19 DIAGNOSIS — E785 Hyperlipidemia, unspecified: Secondary | ICD-10-CM | POA: Diagnosis not present

## 2016-08-19 DIAGNOSIS — M25561 Pain in right knee: Secondary | ICD-10-CM | POA: Diagnosis not present

## 2016-08-19 DIAGNOSIS — E559 Vitamin D deficiency, unspecified: Secondary | ICD-10-CM | POA: Diagnosis not present

## 2016-08-19 DIAGNOSIS — M1710 Unilateral primary osteoarthritis, unspecified knee: Secondary | ICD-10-CM

## 2016-08-19 DIAGNOSIS — R7309 Other abnormal glucose: Secondary | ICD-10-CM | POA: Diagnosis not present

## 2016-08-19 MED ORDER — HYLAN G-F 20 48 MG/6ML IX SOSY
48.0000 mg | PREFILLED_SYRINGE | INTRA_ARTICULAR | Status: AC | PRN
  Administered 2016-08-19: 48 mg via INTRA_ARTICULAR

## 2016-08-19 MED ORDER — LIDOCAINE HCL 1 % IJ SOLN
5.0000 mL | INTRAMUSCULAR | Status: AC | PRN
  Administered 2016-08-19: 5 mL

## 2016-08-19 NOTE — Progress Notes (Signed)
   Procedure Note  Patient: Joanne Reese             Date of Birth: [birth year]           MRN: [id number]             Visit Date: 08/19/2016  Procedures: Visit Diagnoses: Arthritis of knee  Large Joint Inj Date/Time: 08/19/2016 2:01 PM Performed by: Meredith Pel Authorized by: Meredith Pel   Consent Given by:  Patient Site marked: the procedure site was marked   Timeout: prior to procedure the correct patient, procedure, and site was verified   Indications:  Pain, joint swelling and diagnostic evaluation Location:  Knee Site:  R knee Prep: patient was prepped and draped in usual sterile fashion   Needle Size:  18 G Needle Length:  1.5 inches Approach:  Superolateral Ultrasound Guidance: No   Fluoroscopic Guidance: No   Arthrogram: No   Medications:  5 mL lidocaine 1 %; 48 mg Hylan 48 MG/6ML Patient tolerance:  Patient tolerated the procedure well with no immediate complications Large Joint Inj Date/Time: 08/19/2016 2:02 PM Performed by: Meredith Pel Authorized by: Meredith Pel   Consent Given by:  Patient Site marked: the procedure site was marked   Timeout: prior to procedure the correct patient, procedure, and site was verified   Indications:  Pain, joint swelling and diagnostic evaluation Location:  Knee Site:  L knee Prep: patient was prepped and draped in usual sterile fashion   Needle Size:  18 G Needle Length:  1.5 inches Approach:  Superolateral Ultrasound Guidance: No   Fluoroscopic Guidance: No   Arthrogram: No   Medications:  5 mL lidocaine 1 %; 48 mg Hylan 48 MG/6ML Patient tolerance:  Patient tolerated the procedure well with no immediate complications

## 2016-08-28 DIAGNOSIS — E890 Postprocedural hypothyroidism: Secondary | ICD-10-CM | POA: Diagnosis not present

## 2016-08-28 DIAGNOSIS — Z808 Family history of malignant neoplasm of other organs or systems: Secondary | ICD-10-CM | POA: Diagnosis not present

## 2016-08-31 ENCOUNTER — Encounter (INDEPENDENT_AMBULATORY_CARE_PROVIDER_SITE_OTHER): Payer: Self-pay | Admitting: Orthopedic Surgery

## 2016-09-02 ENCOUNTER — Telehealth (INDEPENDENT_AMBULATORY_CARE_PROVIDER_SITE_OTHER): Payer: Self-pay

## 2016-09-02 NOTE — Telephone Encounter (Signed)
Please see mychart correspondence from patient and advise when you get a chance. Thanks so much.      Sent: 09/02/2016  2:15 PM EDT      To: Anderson Malta, MD Subject: RE: Visit Follow-Up Question  Thank you so much.  I will check on Wednesday but I am leaving that day for a couple of nights so if it I do not get an answer in the morning I will not see it until sometime Friday.  Thanks for all your help. ----- Message ----- From: Laurann [place], RT Sent: 09/02/2016 10:58 AM EDT To: Colletta [place] A. Bensen Subject: RE: Visit Follow-Up Question Glad that you have received some relief from the gel. Unfortunately, 6 months is the soonest to have repeat gel. Cortisone is typically the one that could be done 3-4 months apart. I could certainly ask Dr Marlou Sa if we could do repeat cortisone in September. I understand about wanting to enjoy your trip. I will make Dr Marlou Sa aware of all that you have mentioned and get back to you. He is out of the office until Wednesday, but would be happy to get back to you as soon as I knew something.   ----- Message -----    From: Joanne Reese    Sent: 09/02/2016  9:09 AM EDT      To: Anderson Malta, MD Subject: RE: Visit Follow-Up Question  The gel has worked some.  It is not as painful as it was but I am still taking the 800mg  of the duexis every day. I have not tried not taking this.  Some days are better then others. So I will not be able to have another gel injection until February since I had one in August.  That seems like a pretty long time.  I was thinking at least 3 months.  I had a cortisone shot in June so could I get another in September?  I wanted to get a couple of gel shots and see how they worked before considering surgery.  I have vacation plans for next year starting in May through June going across the country so if I have to have surgery I want to be able to go on that trip and actually enjoy myself. I am also concerned about my kidneys staying on the  Duexis everyday. Thanks ----- Message ----- From: Laurann [place], RT Sent: 09/02/2016  8:53 AM EDT To: Colletta [place] A. Holstrom Subject: RE: Visit Follow-Up Question Well it really depends on the patient. Sometimes he does have you wait but other times he is willing to give cortisone if you did not receive any relief from the gel. Was it beneficial for you at all?  ----- Message -----    From: Joanne Reese    Sent: 09/02/2016  8:48 AM EDT      To: Anderson Malta, MD Subject: RE: Visit Follow-Up Question  So would I have to wait three months after the first gel injection to be able to have a cortisone shot? I do not remember is there is a time limit between cortisone shots.  Thanks ----- Message ----- From: Laurann Montana, RT Sent: 09/02/2016  8:34 AM EDT To: Colletta [place] A. Bussa Subject: RE: Visit Follow-Up Question Every 6 months. Per insurance requirements it has to be 6 months or longer. Sometimes Dr Marlou Sa is agreeable to doing cortisone injections for patients in between the gel injections.   ----- Message -----    From: Joanne Reese  Sent: 08/31/2016  8:15 PM EDT      To: Anderson Malta, MD Subject: Visit Follow-Up Question  How often can I have the gel injections?

## 2016-09-04 NOTE — Telephone Encounter (Signed)
In regards to her kidneys hour just to 1 do Duexis once day as needed and then have her kidney function checked every 4 months by her primary care doctor and if she wants another cortisone injection in September that's fine ease call thanks

## 2016-09-04 NOTE — Telephone Encounter (Signed)
Called s/w patient and advised.

## 2016-09-23 ENCOUNTER — Ambulatory Visit (INDEPENDENT_AMBULATORY_CARE_PROVIDER_SITE_OTHER): Payer: Medicare Other | Admitting: Orthopedic Surgery

## 2016-09-23 ENCOUNTER — Encounter (INDEPENDENT_AMBULATORY_CARE_PROVIDER_SITE_OTHER): Payer: Self-pay | Admitting: Orthopedic Surgery

## 2016-09-23 DIAGNOSIS — M1711 Unilateral primary osteoarthritis, right knee: Secondary | ICD-10-CM

## 2016-09-23 NOTE — Progress Notes (Signed)
Office Visit Note   Patient: Joanne Reese           Date of Birth: 04-23-1945           MRN: [id number] Visit Date: 09/23/2016 Requested by: Glendale Chard, [address] [place], [place] [address] PCP: Glendale Chard, MD  Subjective: Chief Complaint  Patient presents with  . Right Knee - Pain    HPI: Ailee is a 71 year old female with right knee arthritis.  She has in the stage arthritis in the right knee.  It is severe.  She is considering knee replacement.  She also has a known Baker's cyst in her knee which she would like to have aspirated.              ROS: All systems reviewed are negative as they relate to the chief complaint within the history of present illness.  Patient denies  fevers or chills.   Assessment & Plan: Visit Diagnoses: No diagnosis found.  Plan: impression is right knee arthritis.  Plan is aspiration and injection of the right knee along with ultrasound guided aspiration of the Baker cyst. Handicap sticker provided.  Follow-up as needed  Follow-Up Instructions: No Follow-up on file.   Orders:  No orders of the defined types were placed in this encounter.  No orders of the defined types were placed in this encounter.     Procedures: Large Joint Inj Date/Time: 09/26/2016 5:49 PM Performed by: Meredith Pel Authorized by: Meredith Pel   Consent Given by:  Patient Site marked: the procedure site was marked   Timeout: prior to procedure the correct patient, procedure, and site was verified   Indications:  Pain, joint swelling and diagnostic evaluation Location:  Knee Site:  R knee Prep: patient was prepped and draped in usual sterile fashion   Needle Size:  18 G Needle Length:  1.5 inches Approach:  Superolateral Ultrasound Guidance: No   Fluoroscopic Guidance: No   Arthrogram: No   Medications:  5 mL lidocaine 1 %; 4 mL bupivacaine 0.25 %; 40 mg methylPREDNISolone acetate 40 MG/ML Aspiration Attempted:  Yes   Aspirate amount (mL):  40 Patient tolerance:  Patient tolerated the procedure well with no immediate complications  Baker's cyst also aspirated under ultrasound guidance     Clinical Data: No additional findings.  Objective: Vital Signs: There were no vitals taken for this visit.  Physical Exam:   Constitutional: Patient appears well-developed HEENT:  Head: Normocephalic Eyes:EOM are normal Neck: Normal range of motion Cardiovascular: Normal rate Pulmonary/chest: Effort normal Neurologic: Patient is alert Skin: Skin is warm Psychiatric: Patient has normal mood and affect    Ortho Exam: orthopedic exam demonstrates full active and passive range of motion of the left knee.  Right knee has slight flexion contracture with an effusion.  Baker's cyst is palpable. collateral and cruciate ligaments are stable.  No other masses lymph adenopathy or skin changes noted in the right knee region  Specialty Comments:  No specialty comments available.  Imaging: No results found.   PMFS History: Patient Active Problem List   Diagnosis Date Noted  . Arthritis of knee 08/19/2016  . Solitary pulmonary nodule on lung CT 08/05/2016  . Essential hypertension 08/05/2016   No past medical history on file.  No family history on file.  No past surgical history on file. Social History   Occupational History  . Not on file.   Social History Main Topics  . Smoking status: Never  Smoker  . Smokeless tobacco: Never Used  . Alcohol use Not on file  . Drug use: Unknown  . Sexual activity: Not on file

## 2016-09-26 DIAGNOSIS — M1711 Unilateral primary osteoarthritis, right knee: Secondary | ICD-10-CM | POA: Diagnosis not present

## 2016-09-26 MED ORDER — BUPIVACAINE HCL 0.25 % IJ SOLN
4.0000 mL | INTRAMUSCULAR | Status: AC | PRN
  Administered 2016-09-26: 4 mL via INTRA_ARTICULAR

## 2016-09-26 MED ORDER — METHYLPREDNISOLONE ACETATE 40 MG/ML IJ SUSP
40.0000 mg | INTRAMUSCULAR | Status: AC | PRN
  Administered 2016-09-26: 40 mg via INTRA_ARTICULAR

## 2016-09-26 MED ORDER — LIDOCAINE HCL 1 % IJ SOLN
5.0000 mL | INTRAMUSCULAR | Status: AC | PRN
  Administered 2016-09-26: 5 mL

## 2016-10-05 DIAGNOSIS — J019 Acute sinusitis, unspecified: Secondary | ICD-10-CM | POA: Diagnosis not present

## 2016-10-16 DIAGNOSIS — N6001 Solitary cyst of right breast: Secondary | ICD-10-CM | POA: Diagnosis not present

## 2016-10-16 DIAGNOSIS — N6489 Other specified disorders of breast: Secondary | ICD-10-CM | POA: Diagnosis not present

## 2016-10-16 DIAGNOSIS — N6002 Solitary cyst of left breast: Secondary | ICD-10-CM | POA: Diagnosis not present

## 2016-10-18 ENCOUNTER — Other Ambulatory Visit (INDEPENDENT_AMBULATORY_CARE_PROVIDER_SITE_OTHER): Payer: Self-pay | Admitting: Orthopedic Surgery

## 2016-10-21 NOTE — Telephone Encounter (Signed)
y

## 2016-10-21 NOTE — Telephone Encounter (Signed)
Ok to rf? 

## 2016-11-05 DIAGNOSIS — M25461 Effusion, right knee: Secondary | ICD-10-CM | POA: Diagnosis not present

## 2016-11-05 DIAGNOSIS — M21862 Other specified acquired deformities of left lower leg: Secondary | ICD-10-CM | POA: Diagnosis not present

## 2016-11-05 DIAGNOSIS — M25462 Effusion, left knee: Secondary | ICD-10-CM | POA: Diagnosis not present

## 2016-11-05 DIAGNOSIS — M25561 Pain in right knee: Secondary | ICD-10-CM | POA: Diagnosis not present

## 2016-11-05 DIAGNOSIS — G8929 Other chronic pain: Secondary | ICD-10-CM | POA: Diagnosis not present

## 2016-11-05 DIAGNOSIS — M21161 Varus deformity, not elsewhere classified, right knee: Secondary | ICD-10-CM | POA: Diagnosis not present

## 2016-11-05 DIAGNOSIS — M25562 Pain in left knee: Secondary | ICD-10-CM | POA: Diagnosis not present

## 2016-11-05 DIAGNOSIS — M170 Bilateral primary osteoarthritis of knee: Secondary | ICD-10-CM | POA: Diagnosis not present

## 2016-11-22 DIAGNOSIS — Z23 Encounter for immunization: Secondary | ICD-10-CM | POA: Diagnosis not present

## 2016-11-22 DIAGNOSIS — M1711 Unilateral primary osteoarthritis, right knee: Secondary | ICD-10-CM | POA: Diagnosis not present

## 2016-11-22 DIAGNOSIS — R030 Elevated blood-pressure reading, without diagnosis of hypertension: Secondary | ICD-10-CM | POA: Diagnosis not present

## 2016-11-22 DIAGNOSIS — Z01818 Encounter for other preprocedural examination: Secondary | ICD-10-CM | POA: Diagnosis not present

## 2016-12-04 ENCOUNTER — Other Ambulatory Visit: Payer: Self-pay | Admitting: Orthopedic Surgery

## 2017-01-27 NOTE — Pre-Procedure Instructions (Signed)
Kaitlynne Wenz Manganello  01/27/2017      [id number] - [place], [place] - [address] [place] [address] Phone: [phone] Fax: [phone]    Your procedure is scheduled on January 28  Report to [facility] Admitting at 0800 A.M.  Call this number if you have problems the morning of surgery:  [phone]   Remember:  Do not eat food or drink liquids after midnight.  Continue all medications as directed by your physician except follow these medication instructions before surgery below   Take these medicines the morning of surgery with A SIP OF WATER SYNTHROID  7 days prior to surgery STOP taking any Aspirin(unless otherwise instructed by your surgeon), Aleve, Naproxen, Ibuprofen, Motrin, Advil, Goody's, BC's, all herbal medications, fish oil, and all vitamins  Follow your doctors instructions regarding your Aspirin.  If no instructions were given by your doctor, then you will need to call the prescribing office office to get instructions.       Do not wear jewelry, make-up or nail polish.  Do not wear lotions, powders, or perfumes, or deodorant.  Do not shave 48 hours prior to surgery.    Do not bring valuables to the hospital.  [facility] is not responsible for any belongings or valuables.  Contacts, dentures or bridgework may not be worn into surgery.  Leave your suitcase in the car.  After surgery it may be brought to your room.  For patients admitted to the hospital, discharge time will be determined by your treatment team.  Patients discharged the day of surgery will not be allowed to drive home.    Special instructions:   [facility]- Preparing For Surgery  Before surgery, you can play an important role. Because skin is not sterile, your skin needs to be as free of germs as possible. You can reduce the number of germs on your skin by washing with CHG (chlorahexidine gluconate) Soap before surgery.  CHG is an antiseptic  cleaner which kills germs and bonds with the skin to continue killing germs even after washing.  Please do not use if you have an allergy to CHG or antibacterial soaps. If your skin becomes reddened/irritated stop using the CHG.  Do not shave (including legs and underarms) for at least 48 hours prior to first CHG shower. It is OK to shave your face.  Please follow these instructions carefully.   1. Shower the NIGHT BEFORE SURGERY and the MORNING OF SURGERY with CHG.   2. If you chose to wash your hair, wash your hair first as usual with your normal shampoo.  3. After you shampoo, rinse your hair and body thoroughly to remove the shampoo.  4. Use CHG as you would any other liquid soap. You can apply CHG directly to the skin and wash gently with a scrungie or a clean washcloth.   5. Apply the CHG Soap to your body ONLY FROM THE NECK DOWN.  Do not use on open wounds or open sores. Avoid contact with your eyes, ears, mouth and genitals (private parts). Wash Face and genitals (private parts)  with your normal soap.  6. Wash thoroughly, paying special attention to the area where your surgery will be performed.  7. Thoroughly rinse your body with warm water from the neck down.  8. DO NOT shower/wash with your normal soap after using and rinsing off the CHG Soap.  9. Pat yourself dry with a CLEAN TOWEL.  10. Wear CLEAN PAJAMAS to bed the night before surgery, wear comfortable clothes the morning of surgery  11. Place CLEAN SHEETS on your bed the night of your first shower and DO NOT SLEEP WITH PETS.    Day of Surgery: Do not apply any deodorants/lotions. Please wear clean clothes to the hospital/surgery center.      Please read over the following fact sheets that you were given.

## 2017-01-28 ENCOUNTER — Encounter (HOSPITAL_COMMUNITY): Payer: Self-pay | Admitting: *Deleted

## 2017-01-28 ENCOUNTER — Other Ambulatory Visit: Payer: Self-pay

## 2017-01-28 ENCOUNTER — Encounter (HOSPITAL_COMMUNITY)
Admission: RE | Admit: 2017-01-28 | Discharge: 2017-01-28 | Disposition: A | Discharge status: Home / Self Care | Payer: Medicare Other | Source: Ambulatory Visit | Attending: Orthopedic Surgery | Admitting: Orthopedic Surgery

## 2017-01-28 DIAGNOSIS — Z01812 Encounter for preprocedural laboratory examination: Secondary | ICD-10-CM | POA: Diagnosis not present

## 2017-01-28 HISTORY — DX: Hypothyroidism, unspecified: E03.9

## 2017-01-28 HISTORY — DX: Gastro-esophageal reflux disease without esophagitis: K21.9

## 2017-01-28 HISTORY — DX: Depression, unspecified: F32.A

## 2017-01-28 HISTORY — DX: Unspecified osteoarthritis, unspecified site: M19.90

## 2017-01-28 HISTORY — DX: Major depressive disorder, single episode, unspecified: F32.9

## 2017-01-28 LAB — CBC WITH DIFFERENTIAL/PLATELET
BASOS ABS: 0 10*3/uL (ref 0.0–0.1)
Basophils Relative: 0 %
EOS ABS: 0.1 10*3/uL (ref 0.0–0.7)
Eosinophils Relative: 1 %
HEMATOCRIT: 35.2 % — AB (ref 36.0–46.0)
HEMOGLOBIN: 10.6 g/dL — AB (ref 12.0–15.0)
LYMPHS PCT: 30 %
Lymphs Abs: 2 10*3/uL (ref 0.7–4.0)
MCH: 22.2 pg — ABNORMAL LOW (ref 26.0–34.0)
MCHC: 30.1 g/dL (ref 30.0–36.0)
MCV: 73.8 fL — ABNORMAL LOW (ref 78.0–100.0)
MONOS PCT: 6 %
Monocytes Absolute: 0.4 10*3/uL (ref 0.1–1.0)
NEUTROS PCT: 63 %
Neutro Abs: 4.2 10*3/uL (ref 1.7–7.7)
Platelets: 250 10*3/uL (ref 150–400)
RBC: 4.77 MIL/uL (ref 3.87–5.11)
RDW: 15.8 % — ABNORMAL HIGH (ref 11.5–15.5)
WBC: 6.7 10*3/uL (ref 4.0–10.5)

## 2017-01-28 LAB — COMPREHENSIVE METABOLIC PANEL
ALBUMIN: 3.4 g/dL — AB (ref 3.5–5.0)
ALK PHOS: 92 U/L (ref 38–126)
ALT: 17 U/L (ref 14–54)
ANION GAP: 8 (ref 5–15)
AST: 20 U/L (ref 15–41)
BILIRUBIN TOTAL: 0.6 mg/dL (ref 0.3–1.2)
BUN: 11 mg/dL (ref 6–20)
CALCIUM: 9.1 mg/dL (ref 8.9–10.3)
CO2: 26 mmol/L (ref 22–32)
Chloride: 106 mmol/L (ref 101–111)
Creatinine, Ser: 0.85 mg/dL (ref 0.44–1.00)
GFR calc Af Amer: >60 mL/min (ref >60)
GFR calc non Af Amer: >60 mL/min (ref >60)
GLUCOSE: 125 mg/dL — AB (ref 65–99)
Potassium: 4.3 mmol/L (ref 3.5–5.1)
SODIUM: 140 mmol/L (ref 135–145)
Total Protein: 6.4 g/dL — ABNORMAL LOW (ref 6.5–8.1)

## 2017-01-28 LAB — SURGICAL PCR SCREEN
MRSA, PCR: NEGATIVE
Staphylococcus aureus: NEGATIVE

## 2017-01-28 NOTE — Progress Notes (Signed)
REQUESTED STRESS TEST FROM DR. SANDERS [phone]).

## 2017-02-07 MED ORDER — CEFAZOLIN SODIUM-DEXTROSE 2-4 GM/100ML-% IV SOLN
2.0000 g | INTRAVENOUS | Status: AC
  Administered 2017-02-10: 2 g via INTRAVENOUS
  Filled 2017-02-07: qty 100

## 2017-02-07 MED ORDER — BUPIVACAINE LIPOSOME 1.3 % IJ SUSP
20.0000 mL | INTRAMUSCULAR | Status: AC
  Administered 2017-02-10: 20 mL
  Filled 2017-02-07: qty 20

## 2017-02-07 MED ORDER — ACETAMINOPHEN 500 MG PO TABS
1000.0000 mg | ORAL_TABLET | Freq: Once | ORAL | Status: AC
  Administered 2017-02-10: 1000 mg via ORAL
  Filled 2017-02-07: qty 2

## 2017-02-07 MED ORDER — DEXAMETHASONE SODIUM PHOSPHATE 10 MG/ML IJ SOLN
8.0000 mg | Freq: Once | INTRAMUSCULAR | Status: AC
  Administered 2017-02-10: 10 mg via INTRAVENOUS
  Filled 2017-02-07: qty 1

## 2017-02-07 MED ORDER — GABAPENTIN 300 MG PO CAPS
300.0000 mg | ORAL_CAPSULE | Freq: Once | ORAL | Status: AC
  Administered 2017-02-10: 300 mg via ORAL
  Filled 2017-02-07: qty 1

## 2017-02-07 MED ORDER — TRANEXAMIC ACID 1000 MG/10ML IV SOLN
1000.0000 mg | INTRAVENOUS | Status: AC
  Administered 2017-02-10: 1000 mg via INTRAVENOUS
  Filled 2017-02-07: qty 1100

## 2017-02-10 ENCOUNTER — Encounter (HOSPITAL_COMMUNITY)
Admission: RE | Disposition: A | Discharge status: Home / Self Care | Payer: Self-pay | Source: Ambulatory Visit | Attending: Orthopedic Surgery

## 2017-02-10 ENCOUNTER — Ambulatory Visit (HOSPITAL_COMMUNITY): Payer: Medicare Other | Admitting: Anesthesiology

## 2017-02-10 ENCOUNTER — Ambulatory Visit (HOSPITAL_COMMUNITY)
Admission: RE | Admit: 2017-02-10 | Discharge: 2017-02-10 | Disposition: A | Discharge status: Home / Self Care | Payer: Medicare Other | Source: Ambulatory Visit | Attending: Orthopedic Surgery | Admitting: Orthopedic Surgery

## 2017-02-10 ENCOUNTER — Encounter (HOSPITAL_COMMUNITY): Payer: Self-pay | Admitting: *Deleted

## 2017-02-10 DIAGNOSIS — Z7982 Long term (current) use of aspirin: Secondary | ICD-10-CM | POA: Insufficient documentation

## 2017-02-10 DIAGNOSIS — G8918 Other acute postprocedural pain: Secondary | ICD-10-CM | POA: Diagnosis not present

## 2017-02-10 DIAGNOSIS — M1711 Unilateral primary osteoarthritis, right knee: Secondary | ICD-10-CM | POA: Insufficient documentation

## 2017-02-10 DIAGNOSIS — F329 Major depressive disorder, single episode, unspecified: Secondary | ICD-10-CM | POA: Diagnosis not present

## 2017-02-10 DIAGNOSIS — K219 Gastro-esophageal reflux disease without esophagitis: Secondary | ICD-10-CM | POA: Diagnosis not present

## 2017-02-10 DIAGNOSIS — R911 Solitary pulmonary nodule: Secondary | ICD-10-CM | POA: Diagnosis not present

## 2017-02-10 DIAGNOSIS — Z881 Allergy status to other antibiotic agents status: Secondary | ICD-10-CM | POA: Diagnosis not present

## 2017-02-10 DIAGNOSIS — E039 Hypothyroidism, unspecified: Secondary | ICD-10-CM | POA: Insufficient documentation

## 2017-02-10 DIAGNOSIS — Z79899 Other long term (current) drug therapy: Secondary | ICD-10-CM | POA: Diagnosis not present

## 2017-02-10 DIAGNOSIS — Z887 Allergy status to serum and vaccine status: Secondary | ICD-10-CM | POA: Diagnosis not present

## 2017-02-10 DIAGNOSIS — I10 Essential (primary) hypertension: Secondary | ICD-10-CM | POA: Insufficient documentation

## 2017-02-10 HISTORY — PX: PARTIAL KNEE ARTHROPLASTY: SHX2174

## 2017-02-10 SURGERY — ARTHROPLASTY, KNEE, UNICOMPARTMENTAL
Anesthesia: Regional | Site: Knee | Laterality: Right

## 2017-02-10 MED ORDER — BUPIVACAINE-EPINEPHRINE 0.5% -1:200000 IJ SOLN
INTRAMUSCULAR | Status: DC | PRN
  Administered 2017-02-10: 20 mL

## 2017-02-10 MED ORDER — PROPOFOL 500 MG/50ML IV EMUL
INTRAVENOUS | Status: DC | PRN
  Administered 2017-02-10: 75 ug/kg/min via INTRAVENOUS

## 2017-02-10 MED ORDER — MIDAZOLAM HCL 2 MG/2ML IJ SOLN
2.0000 mg | Freq: Once | INTRAMUSCULAR | Status: AC
  Administered 2017-02-10: 2 mg via INTRAVENOUS

## 2017-02-10 MED ORDER — METHOCARBAMOL 500 MG PO TABS: 500.0000 mg | ORAL_TABLET | Freq: Four times a day (QID) | ORAL | 0 refills | Status: DC | PRN

## 2017-02-10 MED ORDER — BUPIVACAINE IN DEXTROSE 0.75-8.25 % IT SOLN
INTRATHECAL | Status: DC | PRN
  Administered 2017-02-10: 2 mL via INTRATHECAL

## 2017-02-10 MED ORDER — LACTATED RINGERS IV SOLN
INTRAVENOUS | Status: DC
  Administered 2017-02-10 (×2): via INTRAVENOUS

## 2017-02-10 MED ORDER — 0.9 % SODIUM CHLORIDE (POUR BTL) OPTIME
TOPICAL | Status: DC | PRN
  Administered 2017-02-10: 1000 mL

## 2017-02-10 MED ORDER — OXYCODONE HCL 5 MG/5ML PO SOLN: 5.0000 mg | Freq: Once | ORAL | Status: AC | PRN

## 2017-02-10 MED ORDER — FENTANYL CITRATE (PF) 250 MCG/5ML IJ SOLN
INTRAMUSCULAR | Status: AC
  Filled 2017-02-10: qty 5

## 2017-02-10 MED ORDER — OXYCODONE HCL 5 MG PO TABS
ORAL_TABLET | ORAL | Status: AC
  Filled 2017-02-10: qty 1

## 2017-02-10 MED ORDER — PROMETHAZINE HCL 25 MG/ML IJ SOLN: 6.2500 mg | INTRAMUSCULAR | Status: DC | PRN

## 2017-02-10 MED ORDER — ROPIVACAINE HCL 5 MG/ML IJ SOLN
INTRAMUSCULAR | Status: DC | PRN
  Administered 2017-02-10: 20 mL via PERINEURAL

## 2017-02-10 MED ORDER — ONDANSETRON HCL 4 MG/2ML IJ SOLN
INTRAMUSCULAR | Status: AC
  Filled 2017-02-10: qty 2

## 2017-02-10 MED ORDER — SODIUM CHLORIDE 0.9 % IJ SOLN
INTRAMUSCULAR | Status: DC | PRN
  Administered 2017-02-10: 10 mL via INTRAVENOUS

## 2017-02-10 MED ORDER — PHENYLEPHRINE HCL 10 MG/ML IJ SOLN
INTRAMUSCULAR | Status: DC | PRN
  Administered 2017-02-10: 40 ug via INTRAVENOUS
  Administered 2017-02-10 (×2): 80 ug via INTRAVENOUS

## 2017-02-10 MED ORDER — FENTANYL CITRATE (PF) 100 MCG/2ML IJ SOLN
INTRAMUSCULAR | Status: AC
  Administered 2017-02-10: 50 ug via INTRAVENOUS
  Filled 2017-02-10: qty 2

## 2017-02-10 MED ORDER — MIDAZOLAM HCL 2 MG/2ML IJ SOLN
INTRAMUSCULAR | Status: AC
  Filled 2017-02-10: qty 2

## 2017-02-10 MED ORDER — PHENYLEPHRINE HCL 10 MG/ML IJ SOLN
INTRAVENOUS | Status: DC | PRN
  Administered 2017-02-10: 50 ug/min via INTRAVENOUS

## 2017-02-10 MED ORDER — OXYCODONE HCL 5 MG PO TABS
5.0000 mg | ORAL_TABLET | Freq: Once | ORAL | Status: AC | PRN
  Administered 2017-02-10: 5 mg via ORAL

## 2017-02-10 MED ORDER — FENTANYL CITRATE (PF) 100 MCG/2ML IJ SOLN
50.0000 ug | Freq: Once | INTRAMUSCULAR | Status: AC
  Administered 2017-02-10: 50 ug via INTRAVENOUS

## 2017-02-10 MED ORDER — CHLORHEXIDINE GLUCONATE 4 % EX LIQD: 60.0000 mL | Freq: Once | CUTANEOUS | Status: DC

## 2017-02-10 MED ORDER — SODIUM CHLORIDE 0.9 % IR SOLN
Status: DC | PRN
  Administered 2017-02-10: 3000 mL

## 2017-02-10 MED ORDER — ONDANSETRON HCL 4 MG/2ML IJ SOLN
INTRAMUSCULAR | Status: DC | PRN
  Administered 2017-02-10: 4 mg via INTRAVENOUS

## 2017-02-10 MED ORDER — ASPIRIN EC 325 MG PO TBEC: 325.0000 mg | DELAYED_RELEASE_TABLET | Freq: Two times a day (BID) | ORAL | 0 refills | Status: DC

## 2017-02-10 MED ORDER — PROPOFOL 10 MG/ML IV BOLUS
INTRAVENOUS | Status: DC | PRN
  Administered 2017-02-10 (×2): 20 mg via INTRAVENOUS

## 2017-02-10 MED ORDER — MIDAZOLAM HCL 2 MG/2ML IJ SOLN
INTRAMUSCULAR | Status: AC
  Administered 2017-02-10: 2 mg via INTRAVENOUS
  Filled 2017-02-10: qty 2

## 2017-02-10 MED ORDER — OXYCODONE HCL 10 MG PO TABS: 10.0000 mg | ORAL_TABLET | ORAL | 0 refills | Status: DC | PRN

## 2017-02-10 MED ORDER — HYDROMORPHONE HCL 1 MG/ML IJ SOLN: 0.2500 mg | INTRAMUSCULAR | Status: DC | PRN

## 2017-02-10 SURGICAL SUPPLY — 64 items
BANDAGE ACE 6X5 VEL STRL LF (GAUZE/BANDAGES/DRESSINGS) ×3 IMPLANT
BANDAGE ESMARK 6X9 LF (GAUZE/BANDAGES/DRESSINGS) ×1 IMPLANT
BEARING TIB UKA IBAL SZ3 10 (Knees) ×2 IMPLANT
BEARING TIB UKA IBAL SZ3 10MM (Knees) ×1 IMPLANT
BLADE SAW RECIP 87.9 MT (BLADE) IMPLANT
BLADE SAW SGTL 13X75X1.27 (BLADE) IMPLANT
BLADE SAW SGTL 83.5X18.5 (BLADE) IMPLANT
BNDG ELASTIC 6X10 VLCR STRL LF (GAUZE/BANDAGES/DRESSINGS) ×3 IMPLANT
BNDG ESMARK 6X9 LF (GAUZE/BANDAGES/DRESSINGS) ×3
BOWL SMART MIX CTS (DISPOSABLE) ×3 IMPLANT
CEMENT BONE SIMPLEX SPEEDSET (Cement) ×3 IMPLANT
CLOSURE WOUND 1/2 X4 (GAUZE/BANDAGES/DRESSINGS) ×1
COVER SURGICAL LIGHT HANDLE (MISCELLANEOUS) ×3 IMPLANT
CUFF TOURNIQUET SINGLE 34IN LL (TOURNIQUET CUFF) ×3 IMPLANT
DRAPE EXTREMITY T 121X128X90 (DRAPE) ×3 IMPLANT
DRAPE HALF SHEET 40X57 (DRAPES) ×3 IMPLANT
DRAPE IMP U-DRAPE 54X76 (DRAPES) ×3 IMPLANT
DRAPE INCISE IOBAN 66X45 STRL (DRAPES) ×6 IMPLANT
DRAPE U-SHAPE 47X51 STRL (DRAPES) ×3 IMPLANT
DRSG AQUACEL AG ADV 3.5X10 (GAUZE/BANDAGES/DRESSINGS) ×3 IMPLANT
DURAPREP 26ML APPLICATOR (WOUND CARE) ×6 IMPLANT
ELECT REM PT RETURN 9FT ADLT (ELECTROSURGICAL) ×3
ELECTRODE REM PT RTRN 9FT ADLT (ELECTROSURGICAL) ×1 IMPLANT
FEMORAL UKA IBAL RMED LLAT SZ3 (Knees) ×3 IMPLANT
FLUID NSS /IRRIG 3000 ML XXX (IV SOLUTION) ×3 IMPLANT
GLOVE BIOGEL M 7.0 STRL (GLOVE) ×6 IMPLANT
GLOVE BIOGEL PI IND STRL 7.5 (GLOVE) ×2 IMPLANT
GLOVE BIOGEL PI IND STRL 8.5 (GLOVE) IMPLANT
GLOVE BIOGEL PI INDICATOR 7.5 (GLOVE) ×4
GLOVE BIOGEL PI INDICATOR 8.5 (GLOVE)
GLOVE SURG ORTHO 8.0 STRL STRW (GLOVE) IMPLANT
GOWN STRL REUS W/ TWL LRG LVL3 (GOWN DISPOSABLE) ×2 IMPLANT
GOWN STRL REUS W/ TWL XL LVL3 (GOWN DISPOSABLE) ×1 IMPLANT
GOWN STRL REUS W/TWL 2XL LVL3 (GOWN DISPOSABLE) IMPLANT
GOWN STRL REUS W/TWL LRG LVL3 (GOWN DISPOSABLE) ×4
GOWN STRL REUS W/TWL XL LVL3 (GOWN DISPOSABLE) ×2
HANDPIECE INTERPULSE COAX TIP (DISPOSABLE) ×2
HOOD PEEL AWAY FACE SHEILD DIS (HOOD) ×9 IMPLANT
KIT BASIN OR (CUSTOM PROCEDURE TRAY) ×3 IMPLANT
KIT ROOM TURNOVER OR (KITS) ×3 IMPLANT
MANIFOLD NEPTUNE II (INSTRUMENTS) ×3 IMPLANT
NEEDLE 21X1 OR PACK (NEEDLE) ×3 IMPLANT
NEEDLE HYPO 21X1 ECLIPSE (NEEDLE) ×3 IMPLANT
NS IRRIG 1000ML POUR BTL (IV SOLUTION) ×3 IMPLANT
PACK BLADE SAW RECIP 70 3 PT (BLADE) ×3 IMPLANT
PACK TOTAL JOINT (CUSTOM PROCEDURE TRAY) ×3 IMPLANT
PAD ARMBOARD 7.5X6 YLW CONV (MISCELLANEOUS) ×6 IMPLANT
SET HNDPC FAN SPRY TIP SCT (DISPOSABLE) ×1 IMPLANT
STRIP CLOSURE SKIN 1/2X4 (GAUZE/BANDAGES/DRESSINGS) ×2 IMPLANT
SUCTION FRAZIER HANDLE 10FR (MISCELLANEOUS) ×2
SUCTION TUBE FRAZIER 10FR DISP (MISCELLANEOUS) ×1 IMPLANT
SUT BONE WAX W31G (SUTURE) ×3 IMPLANT
SUT MNCRL AB 3-0 PS2 18 (SUTURE) ×3 IMPLANT
SUT VIC AB 0 CT1 27 (SUTURE) ×2
SUT VIC AB 0 CT1 27XBRD ANBCTR (SUTURE) ×1 IMPLANT
SUT VIC AB 1 CT1 27 (SUTURE) ×2
SUT VIC AB 1 CT1 27XBRD ANBCTR (SUTURE) ×1 IMPLANT
SUT VIC AB 2-0 CT1 27 (SUTURE) ×2
SUT VIC AB 2-0 CT1 TAPERPNT 27 (SUTURE) ×1 IMPLANT
SYR 20CC LL (SYRINGE) ×6 IMPLANT
TOWEL OR 17X24 6PK STRL BLUE (TOWEL DISPOSABLE) ×3 IMPLANT
TOWEL OR 17X26 10 PK STRL BLUE (TOWEL DISPOSABLE) ×3 IMPLANT
TRAY TIBIA COMP SZ3 IBALUKA RT (Knees) ×3 IMPLANT
WRAP KNEE MAXI GEL POST OP (GAUZE/BANDAGES/DRESSINGS) ×3 IMPLANT

## 2017-02-10 NOTE — Anesthesia Procedure Notes (Signed)
Procedure Name: MAC Date/Time: 02/10/2017 11:05 AM Performed by: Jenne Campus, CRNA Pre-anesthesia Checklist: Patient identified, Emergency Drugs available, Suction available and Patient being monitored Oxygen Delivery Method: Simple face mask

## 2017-02-10 NOTE — H&P (Signed)
Joanne Reese MRN:  [id number] DOB/SEX:  06/03/1945/female  CHIEF COMPLAINT:  Painful right Knee  HISTORY: Patient is a 72 y.o. female presented with a history of pain in the right knee. Onset of symptoms was gradual starting a few years ago with gradually worsening course since that time. Patient has been treated conservatively with over-the-counter NSAIDs and activity modification. Patient currently rates pain in the knee at 10 out of 10 with activity. There is pain at night.  PAST MEDICAL HISTORY: Patient Active Problem List   Diagnosis Date Noted  . Arthritis of knee 08/19/2016  . Solitary pulmonary nodule on lung CT 08/05/2016  . Essential hypertension 08/05/2016   Past Medical History:  Diagnosis Date  . Arthritis   . Depression   . GERD (gastroesophageal reflux disease)    occ  . Hypothyroidism    Past Surgical History:  Procedure Laterality Date  . CYST EXCISION     ovary  30+ yrs  . THYROID LOBECTOMY       MEDICATIONS:   Medications Prior to Admission  Medication Sig Dispense Refill Last Dose  . acyclovir (ZOVIRAX) 400 MG tablet Take 400 mg by mouth 3 (three) times daily as needed (for outbreaks).   Past Month at Unknown time  . aspirin EC 81 MG tablet Take 81 mg by mouth daily.   Past Week at Unknown time  . Calcium Carbonate-Vitamin D (CALCIUM-VITAMIN D3 PO) Take 1 tablet by mouth daily.   Past Week at Unknown time  . Cholecalciferol (VITAMIN D3 PO) Take 1 capsule by mouth daily.   Past Week at Unknown time  . ibuprofen (ADVIL,MOTRIN) 200 MG tablet Take 600 mg by mouth every 6 (six) hours as needed for headache or moderate pain.   Past Week at Unknown time  . simvastatin (ZOCOR) 20 MG tablet Take 20 mg by mouth daily.    02/09/2017 at Unknown time  . SYNTHROID 112 MCG tablet Take 112 mcg by mouth daily before breakfast.    02/09/2017 at Unknown time  . DUEXIS 800-26.6 MG TABS TAKE 1 TABLET BY MOUTH DAILY (Patient not taking: Reported on 01/21/2017) 90 tablet 0 Not  Taking at Unknown time    ALLERGIES:   Allergies  Allergen Reactions  . Nitrofurantoin Anaphylaxis  . Pneumococcal Vaccines Other (See Comments)    LOCAL REACTION Redness & Pain around injection site  Pneumoxvac 23 (active) per patient report    REVIEW OF SYSTEMS:  A comprehensive review of systems was negative except for: Musculoskeletal: positive for arthralgias and bone pain   FAMILY HISTORY:  History reviewed. No pertinent family history.  SOCIAL HISTORY:   Social History   Tobacco Use  . Smoking status: Never Smoker  . Smokeless tobacco: Never Used  Substance Use Topics  . Alcohol use: Yes    Comment: occ     EXAMINATION:  Vital signs in last 24 hours: Temp:  [97.9 F (36.6 C)] 97.9 F (36.6 C) (01/28 0807) Pulse Rate:  [85] 85 (01/28 0807) Resp:  [20] 20 (01/28 0807) BP: (155)/(82) 155/82 (01/28 0807) SpO2:  [96 %] 96 % (01/28 0807) Weight:  [92.1 kg (203 lb)] 92.1 kg (203 lb) (01/28 0838)  BP (!) 155/82   Pulse 85   Temp 97.9 F (36.6 C) (Oral)   Resp 20   Ht 5\' 3"  (1.6 m)   Wt 92.1 kg (203 lb)   LMP  (LMP Unknown)   SpO2 96%   BMI 35.96 kg/m   General Appearance:  Alert, cooperative, no distress, appears stated age  Head:    Normocephalic, without obvious abnormality, atraumatic  Eyes:    PERRL, conjunctiva/corneas clear, EOM's intact, fundi    benign, both eyes  Ears:    Normal TM's and external ear canals, both ears  Nose:   Nares normal, septum midline, mucosa normal, no drainage    or sinus tenderness  Throat:   Lips, mucosa, and tongue normal; teeth and gums normal  Neck:   Supple, symmetrical, trachea midline, no adenopathy;    thyroid:  no enlargement/tenderness/nodules; no carotid   bruit or JVD  Back:     Symmetric, no curvature, ROM normal, no CVA tenderness  Lungs:     Clear to auscultation bilaterally, respirations unlabored  Chest Wall:    No tenderness or deformity   Heart:    Regular rate and rhythm, S1 and S2 normal, no  murmur, rub   or gallop  Breast Exam:    No tenderness, masses, or nipple abnormality  Abdomen:     Soft, non-tender, bowel sounds active all four quadrants,    no masses, no organomegaly  Genitalia:    Normal female without lesion, discharge or tenderness  Rectal:    Normal tone, no masses or tenderness;   guaiac negative stool  Extremities:   Extremities normal, atraumatic, no cyanosis or edema  Pulses:   2+ and symmetric all extremities  Skin:   Skin color, texture, turgor normal, no rashes or lesions  Lymph nodes:   Cervical, supraclavicular, and axillary nodes normal  Neurologic:   CNII-XII intact, normal strength, sensation and reflexes    throughout    Musculoskeletal:  ROM 0-120, Ligaments intact,  Imaging Review Plain radiographs demonstrate severe degenerative joint disease of the right knee. The overall alignment is mild varus. The bone quality appears to be good for age and reported activity level.  Assessment/Plan: Primary osteoarthritis, right knee   The patient history, physical examination and imaging studies are consistent with advanced degenerative joint disease of the right knee. The patient has failed conservative treatment.  The clearance notes were reviewed.  After discussion with the patient it was felt that partial Knee Replacement was indicated. The procedure,  risks, and benefits of partial knee arthroplasty were presented and reviewed. The risks including but not limited to aseptic loosening, infection, blood clots, vascular injury, stiffness, patella tracking problems complications among others were discussed. The patient acknowledged the explanation, agreed to proceed with the plan.  Donia Ast 02/10/2017, 9:19 AM

## 2017-02-10 NOTE — Anesthesia Preprocedure Evaluation (Addendum)
Anesthesia Evaluation  Patient identified by MRN, date of birth, ID band Patient awake    Reviewed: Allergy & Precautions, NPO status , Patient's Chart, lab work & pertinent test results  Airway Mallampati: II  TM Distance: >3 FB Neck ROM: Full    Dental no notable dental hx. (+) Teeth Intact, Dental Advisory Given   Pulmonary neg pulmonary ROS,    Pulmonary exam normal breath sounds clear to auscultation       Cardiovascular hypertension, Pt. on medications negative cardio ROS Normal cardiovascular exam Rhythm:Regular Rate:Normal     Neuro/Psych Depression negative neurological ROS  negative psych ROS   GI/Hepatic negative GI ROS, Neg liver ROS, GERD  ,  Endo/Other  negative endocrine ROSHypothyroidism   Renal/GU negative Renal ROS  negative genitourinary   Musculoskeletal negative musculoskeletal ROS (+) Arthritis , Osteoarthritis,    Abdominal   Peds negative pediatric ROS (+)  Hematology negative hematology ROS (+)   Anesthesia Other Findings   Reproductive/Obstetrics negative OB ROS                            Anesthesia Physical Anesthesia Plan  ASA: II  Anesthesia Plan: Spinal and Regional   Post-op Pain Management:  Regional for Post-op pain   Induction: Intravenous  PONV Risk Score and Plan: 2 and Ondansetron and Midazolam  Airway Management Planned: Simple Face Mask  Additional Equipment:   Intra-op Plan:   Post-operative Plan:   Informed Consent: I have reviewed the patients History and Physical, chart, labs and discussed the procedure including the risks, benefits and alternatives for the proposed anesthesia with the patient or authorized representative who has indicated his/her understanding and acceptance.   Dental advisory given  Plan Discussed with: CRNA  Anesthesia Plan Comments:         Anesthesia Quick Evaluation

## 2017-02-10 NOTE — Anesthesia Procedure Notes (Signed)
Anesthesia Regional Block: Adductor canal block   Pre-Anesthetic Checklist: ,, timeout performed, Correct Patient, Correct Site, Correct Laterality, Correct Procedure, Correct Position, site marked, Risks and benefits discussed,  Surgical consent,  Pre-op evaluation,  At surgeon's request and post-op pain management  Laterality: Right  Prep: chloraprep       Needles:  Injection technique: Single-shot  Needle Type: Stimiplex     Needle Length: 9cm  Needle Gauge: 21     Additional Needles:   Procedures:,,,, ultrasound used (permanent image in chart),,,,  Narrative:  Start time: 02/10/2017 9:50 AM End time: 02/10/2017 9:55 AM Injection made incrementally with aspirations every 5 mL.  Performed by: Personally  Anesthesiologist: Lynda Rainwater, MD

## 2017-02-10 NOTE — Anesthesia Procedure Notes (Signed)
Spinal  Patient location during procedure: OR Start time: 02/10/2017 11:09 AM End time: 02/10/2017 11:14 AM Staffing Anesthesiologist: Lynda Rainwater, MD Performed: anesthesiologist  Preanesthetic Checklist Completed: patient identified, site marked, surgical consent, pre-op evaluation, timeout performed, IV checked, risks and benefits discussed and monitors and equipment checked Spinal Block Patient position: sitting Prep: Betadine Patient monitoring: heart rate, cardiac monitor, continuous pulse ox and blood pressure Approach: midline Location: L3-4 Injection technique: single-shot Needle Needle type: Pencan  Needle gauge: 24 G Needle length: 9 cm

## 2017-02-10 NOTE — Transfer of Care (Signed)
Immediate Anesthesia Transfer of Care Note  Patient: Joanne Reese  Procedure(s) Performed: RIGHT UNICOMPARTMENTAL KNEE (Right Knee)  Patient Location: PACU  Anesthesia Type:MAC and Spinal  Level of Consciousness: awake, oriented and patient cooperative  Airway & Oxygen Therapy: Patient Spontanous Breathing and Patient connected to face mask oxygen  Post-op Assessment: Report given to RN and Post -op Vital signs reviewed and stable  Post vital signs: Reviewed  Last Vitals:  Vitals:   02/10/17 0807 02/10/17 1253  BP: (!) 155/82 109/66  Pulse: 85 80  Resp: 20 18  Temp: 36.6 C   SpO2: 96% 99%    Last Pain:  Vitals:   02/10/17 0807  TempSrc: Oral      Patients Stated Pain Goal: 3 (10/06/28 0762)  Complications: No apparent anesthesia complications

## 2017-02-11 NOTE — Anesthesia Postprocedure Evaluation (Signed)
Anesthesia Post Note  Patient: Joanne Reese  Procedure(s) Performed: RIGHT UNICOMPARTMENTAL KNEE (Right Knee)     Patient location during evaluation: PACU Anesthesia Type: Spinal Level of consciousness: oriented and awake and alert Pain management: pain level controlled Vital Signs Assessment: post-procedure vital signs reviewed and stable Respiratory status: spontaneous breathing, respiratory function stable and patient connected to nasal cannula oxygen Cardiovascular status: blood pressure returned to baseline and stable Postop Assessment: no headache, no backache and no apparent nausea or vomiting Anesthetic complications: no    Last Vitals:  Vitals:   02/10/17 1553 02/10/17 1630  BP: (!) 147/84 (!) 153/80  Pulse: 77 78  Resp: 18 15  Temp: 36.4 C   SpO2: 97% 98%    Last Pain:  Vitals:   02/10/17 1604  TempSrc:   PainSc: 3                  Tiajuana Amass

## 2017-02-12 ENCOUNTER — Encounter (HOSPITAL_COMMUNITY): Payer: Self-pay | Admitting: Orthopedic Surgery

## 2017-02-13 NOTE — Op Note (Addendum)
UNI KNEE REPLACEMENT OPERATIVE NOTE:  02/10/2017  2:48 PM  PATIENT:  Joanne Reese  72 y.o. female  PRE-OPERATIVE DIAGNOSIS:  primary osteoarthritis right knee  POST-OPERATIVE DIAGNOSIS:  primary osteoarthritis right knee  PROCEDURE:  Procedure(s): RIGHT UNICOMPARTMENTAL KNEE  SURGEON:  Surgeon(s): Vickey Huger, MD  PHYSICIAN ASSISTANT: Nehemiah Massed, [facility]  ANESTHESIA:   spinal  DRAINS: Hemovac  SPECIMEN: None  COUNTS:  Correct  TOURNIQUET:   Total Tourniquet Time Documented: Thigh (Right) - 49 minutes Total: Thigh (Right) - 49 minutes   DICTATION:  Indication for procedure:    The patient is a 72 y.o. female who has failed conservative treatment for primary osteoarthritis right knee.  Informed consent was obtained prior to anesthesia. The risks versus benefits of the operation were explain and in a way the patient can, and did, understand.   On the implant demand matching protocol, this patient scored 10.  Therefore, this patient was receive a polyethylene insert with vitamin E which is a high demand implant.  Description of procedure:     The patient was taken to the operating room and placed under anesthesia.  The patient was positioned in the usual fashion taking care that all body parts were adequately padded and/or protected.  I foley catheter was not placed.  A tourniquet was applied and the leg prepped and draped in the usual sterile fashion.  The extremity was exsanguinated with the esmarch and tourniquet inflated to 350 mmHg.  Pre-operative range of motion was normal.  The knee was in 4 degree of mild varus.  A midline incision approximately 3-4 inches long was made with a #10 blade.  A new blade was used to make a parapatellar arthrotomy going 1 cm into the quadriceps tendon, over the patella, and alongside the medial aspect of the patellar tendon.  A synovectomy was then performed with the #10 blade and forceps. I then elevated the deep MCL off the medial  tibial flare. The knee was put at 90 degrees and the patient specific cutting blocks were used to make our proximal tibial cut and distal femoral cut. The medial meniscus was removed at this point.  I then used the 4 cutting guide on the femur to drill for lugs and cut the chamfers. Likewise, a 4 tibial baseplate was used to prepare the tibia. I then trialed the 4 femur and 4 tibia. I trialed several poly inserts and a 10 mm achieved good balance in flexion and extension.  I then irrigated copiously and then mixed the cement. I injected exparel in the deep soft tissues at this point. I then cemented the tibia first followed by the femur and removed excess cement and then inserted the polyethylene. I placed the leg in extension and finished injecting the rest of the exparel.  BLOOD LOSS:  300cc DRAINS: 1 hemovac, 1 pain catheter COMPLICATIONS:  None.  PLAN OF CARE: Discharge to home after PACU  PATIENT DISPOSITION:  PACU - hemodynamically stable.   Delay start of Pharmacological VTE agent (>24hrs) due to surgical blood loss or risk of bleeding:  not applicable  Please fax a copy of this op note to my office at [phone] (please only include page 1 and 2 of the Case Information op note)

## 2017-02-14 DIAGNOSIS — M1711 Unilateral primary osteoarthritis, right knee: Secondary | ICD-10-CM | POA: Diagnosis not present

## 2017-02-14 DIAGNOSIS — M25561 Pain in right knee: Secondary | ICD-10-CM | POA: Diagnosis not present

## 2017-02-18 DIAGNOSIS — Z96651 Presence of right artificial knee joint: Secondary | ICD-10-CM | POA: Diagnosis not present

## 2017-02-19 DIAGNOSIS — M1711 Unilateral primary osteoarthritis, right knee: Secondary | ICD-10-CM | POA: Diagnosis not present

## 2017-02-19 DIAGNOSIS — M25561 Pain in right knee: Secondary | ICD-10-CM | POA: Diagnosis not present

## 2017-02-21 DIAGNOSIS — M1711 Unilateral primary osteoarthritis, right knee: Secondary | ICD-10-CM | POA: Diagnosis not present

## 2017-02-21 DIAGNOSIS — M25561 Pain in right knee: Secondary | ICD-10-CM | POA: Diagnosis not present

## 2017-02-25 DIAGNOSIS — M25561 Pain in right knee: Secondary | ICD-10-CM | POA: Diagnosis not present

## 2017-02-25 DIAGNOSIS — M1711 Unilateral primary osteoarthritis, right knee: Secondary | ICD-10-CM | POA: Diagnosis not present

## 2017-02-28 DIAGNOSIS — M1711 Unilateral primary osteoarthritis, right knee: Secondary | ICD-10-CM | POA: Diagnosis not present

## 2017-02-28 DIAGNOSIS — M25561 Pain in right knee: Secondary | ICD-10-CM | POA: Diagnosis not present

## 2017-03-04 DIAGNOSIS — M1711 Unilateral primary osteoarthritis, right knee: Secondary | ICD-10-CM | POA: Diagnosis not present

## 2017-03-04 DIAGNOSIS — M25561 Pain in right knee: Secondary | ICD-10-CM | POA: Diagnosis not present

## 2017-03-05 DIAGNOSIS — Z124 Encounter for screening for malignant neoplasm of cervix: Secondary | ICD-10-CM | POA: Diagnosis not present

## 2017-03-05 DIAGNOSIS — Z8619 Personal history of other infectious and parasitic diseases: Secondary | ICD-10-CM | POA: Diagnosis not present

## 2017-03-05 DIAGNOSIS — Z1151 Encounter for screening for human papillomavirus (HPV): Secondary | ICD-10-CM | POA: Diagnosis not present

## 2017-03-05 DIAGNOSIS — Z01419 Encounter for gynecological examination (general) (routine) without abnormal findings: Secondary | ICD-10-CM | POA: Diagnosis not present

## 2017-03-07 DIAGNOSIS — M25561 Pain in right knee: Secondary | ICD-10-CM | POA: Diagnosis not present

## 2017-03-07 DIAGNOSIS — M1711 Unilateral primary osteoarthritis, right knee: Secondary | ICD-10-CM | POA: Diagnosis not present

## 2017-03-17 DIAGNOSIS — M1711 Unilateral primary osteoarthritis, right knee: Secondary | ICD-10-CM | POA: Diagnosis not present

## 2017-03-17 DIAGNOSIS — M25561 Pain in right knee: Secondary | ICD-10-CM | POA: Diagnosis not present

## 2017-03-18 DIAGNOSIS — G8929 Other chronic pain: Secondary | ICD-10-CM | POA: Diagnosis not present

## 2017-03-18 DIAGNOSIS — M1712 Unilateral primary osteoarthritis, left knee: Secondary | ICD-10-CM | POA: Diagnosis not present

## 2017-03-20 DIAGNOSIS — M1711 Unilateral primary osteoarthritis, right knee: Secondary | ICD-10-CM | POA: Diagnosis not present

## 2017-03-20 DIAGNOSIS — M25561 Pain in right knee: Secondary | ICD-10-CM | POA: Diagnosis not present

## 2017-03-26 DIAGNOSIS — M25561 Pain in right knee: Secondary | ICD-10-CM | POA: Diagnosis not present

## 2017-03-26 DIAGNOSIS — M1711 Unilateral primary osteoarthritis, right knee: Secondary | ICD-10-CM | POA: Diagnosis not present

## 2017-03-28 DIAGNOSIS — M25561 Pain in right knee: Secondary | ICD-10-CM | POA: Diagnosis not present

## 2017-03-28 DIAGNOSIS — M1711 Unilateral primary osteoarthritis, right knee: Secondary | ICD-10-CM | POA: Diagnosis not present

## 2017-03-31 DIAGNOSIS — H2513 Age-related nuclear cataract, bilateral: Secondary | ICD-10-CM | POA: Diagnosis not present

## 2017-03-31 DIAGNOSIS — D3132 Benign neoplasm of left choroid: Secondary | ICD-10-CM | POA: Diagnosis not present

## 2017-03-31 DIAGNOSIS — H04123 Dry eye syndrome of bilateral lacrimal glands: Secondary | ICD-10-CM | POA: Diagnosis not present

## 2017-04-01 DIAGNOSIS — M1711 Unilateral primary osteoarthritis, right knee: Secondary | ICD-10-CM | POA: Diagnosis not present

## 2017-04-01 DIAGNOSIS — M25561 Pain in right knee: Secondary | ICD-10-CM | POA: Diagnosis not present

## 2017-04-03 DIAGNOSIS — Z79899 Other long term (current) drug therapy: Secondary | ICD-10-CM | POA: Diagnosis not present

## 2017-04-03 DIAGNOSIS — M25561 Pain in right knee: Secondary | ICD-10-CM | POA: Diagnosis not present

## 2017-04-03 DIAGNOSIS — M1711 Unilateral primary osteoarthritis, right knee: Secondary | ICD-10-CM | POA: Diagnosis not present

## 2017-04-03 DIAGNOSIS — E785 Hyperlipidemia, unspecified: Secondary | ICD-10-CM | POA: Diagnosis not present

## 2017-04-03 DIAGNOSIS — M542 Cervicalgia: Secondary | ICD-10-CM | POA: Diagnosis not present

## 2017-04-03 DIAGNOSIS — R7309 Other abnormal glucose: Secondary | ICD-10-CM | POA: Diagnosis not present

## 2017-04-03 DIAGNOSIS — E039 Hypothyroidism, unspecified: Secondary | ICD-10-CM | POA: Diagnosis not present

## 2017-04-14 DIAGNOSIS — M25561 Pain in right knee: Secondary | ICD-10-CM | POA: Diagnosis not present

## 2017-04-14 DIAGNOSIS — M1711 Unilateral primary osteoarthritis, right knee: Secondary | ICD-10-CM | POA: Diagnosis not present

## 2017-04-18 DIAGNOSIS — Z87898 Personal history of other specified conditions: Secondary | ICD-10-CM | POA: Diagnosis not present

## 2017-04-18 DIAGNOSIS — N6314 Unspecified lump in the right breast, lower inner quadrant: Secondary | ICD-10-CM | POA: Diagnosis not present

## 2017-04-18 DIAGNOSIS — N6313 Unspecified lump in the right breast, lower outer quadrant: Secondary | ICD-10-CM | POA: Diagnosis not present

## 2017-04-18 DIAGNOSIS — N6322 Unspecified lump in the left breast, upper inner quadrant: Secondary | ICD-10-CM | POA: Diagnosis not present

## 2017-04-18 DIAGNOSIS — N6311 Unspecified lump in the right breast, upper outer quadrant: Secondary | ICD-10-CM | POA: Diagnosis not present

## 2017-04-18 DIAGNOSIS — R928 Other abnormal and inconclusive findings on diagnostic imaging of breast: Secondary | ICD-10-CM | POA: Diagnosis not present

## 2017-04-18 DIAGNOSIS — R922 Inconclusive mammogram: Secondary | ICD-10-CM | POA: Diagnosis not present

## 2017-04-18 DIAGNOSIS — N6321 Unspecified lump in the left breast, upper outer quadrant: Secondary | ICD-10-CM | POA: Diagnosis not present

## 2017-08-12 DIAGNOSIS — M1712 Unilateral primary osteoarthritis, left knee: Secondary | ICD-10-CM | POA: Diagnosis not present

## 2017-08-12 DIAGNOSIS — G8929 Other chronic pain: Secondary | ICD-10-CM | POA: Diagnosis not present

## 2017-10-09 DIAGNOSIS — R7309 Other abnormal glucose: Secondary | ICD-10-CM | POA: Diagnosis not present

## 2017-10-09 DIAGNOSIS — F4320 Adjustment disorder, unspecified: Secondary | ICD-10-CM | POA: Diagnosis not present

## 2017-10-09 DIAGNOSIS — Z23 Encounter for immunization: Secondary | ICD-10-CM | POA: Diagnosis not present

## 2017-10-09 DIAGNOSIS — E039 Hypothyroidism, unspecified: Secondary | ICD-10-CM | POA: Diagnosis not present

## 2017-10-09 DIAGNOSIS — E785 Hyperlipidemia, unspecified: Secondary | ICD-10-CM | POA: Diagnosis not present

## 2017-10-09 DIAGNOSIS — Z79899 Other long term (current) drug therapy: Secondary | ICD-10-CM | POA: Diagnosis not present

## 2017-10-09 DIAGNOSIS — Z6837 Body mass index (BMI) 37.0-37.9, adult: Secondary | ICD-10-CM | POA: Diagnosis not present

## 2017-10-23 DIAGNOSIS — E890 Postprocedural hypothyroidism: Secondary | ICD-10-CM | POA: Diagnosis not present

## 2017-10-23 DIAGNOSIS — Z808 Family history of malignant neoplasm of other organs or systems: Secondary | ICD-10-CM | POA: Diagnosis not present

## 2017-11-05 DIAGNOSIS — D2262 Melanocytic nevi of left upper limb, including shoulder: Secondary | ICD-10-CM | POA: Diagnosis not present

## 2017-11-05 DIAGNOSIS — D2261 Melanocytic nevi of right upper limb, including shoulder: Secondary | ICD-10-CM | POA: Diagnosis not present

## 2017-11-05 DIAGNOSIS — L820 Inflamed seborrheic keratosis: Secondary | ICD-10-CM | POA: Diagnosis not present

## 2017-11-05 DIAGNOSIS — D2271 Melanocytic nevi of right lower limb, including hip: Secondary | ICD-10-CM | POA: Diagnosis not present

## 2017-11-05 DIAGNOSIS — L821 Other seborrheic keratosis: Secondary | ICD-10-CM | POA: Diagnosis not present

## 2017-11-05 DIAGNOSIS — D485 Neoplasm of uncertain behavior of skin: Secondary | ICD-10-CM | POA: Diagnosis not present

## 2017-11-05 DIAGNOSIS — D225 Melanocytic nevi of trunk: Secondary | ICD-10-CM | POA: Diagnosis not present

## 2017-12-31 DIAGNOSIS — J029 Acute pharyngitis, unspecified: Secondary | ICD-10-CM | POA: Diagnosis not present

## 2017-12-31 DIAGNOSIS — J019 Acute sinusitis, unspecified: Secondary | ICD-10-CM | POA: Diagnosis not present

## 2017-12-31 DIAGNOSIS — J111 Influenza due to unidentified influenza virus with other respiratory manifestations: Secondary | ICD-10-CM | POA: Diagnosis not present

## 2018-01-01 DIAGNOSIS — G8929 Other chronic pain: Secondary | ICD-10-CM | POA: Diagnosis not present

## 2018-01-01 DIAGNOSIS — M1712 Unilateral primary osteoarthritis, left knee: Secondary | ICD-10-CM | POA: Diagnosis not present

## 2018-02-05 DIAGNOSIS — N6321 Unspecified lump in the left breast, upper outer quadrant: Secondary | ICD-10-CM | POA: Diagnosis not present

## 2018-02-05 DIAGNOSIS — N6320 Unspecified lump in the left breast, unspecified quadrant: Secondary | ICD-10-CM | POA: Diagnosis not present

## 2018-02-17 DIAGNOSIS — N6001 Solitary cyst of right breast: Secondary | ICD-10-CM | POA: Diagnosis not present

## 2018-02-17 DIAGNOSIS — R928 Other abnormal and inconclusive findings on diagnostic imaging of breast: Secondary | ICD-10-CM | POA: Diagnosis not present

## 2018-02-17 DIAGNOSIS — N6002 Solitary cyst of left breast: Secondary | ICD-10-CM | POA: Diagnosis not present

## 2018-02-17 DIAGNOSIS — R922 Inconclusive mammogram: Secondary | ICD-10-CM | POA: Diagnosis not present

## 2018-03-13 DIAGNOSIS — Z01419 Encounter for gynecological examination (general) (routine) without abnormal findings: Secondary | ICD-10-CM | POA: Diagnosis not present

## 2018-03-13 DIAGNOSIS — N6320 Unspecified lump in the left breast, unspecified quadrant: Secondary | ICD-10-CM | POA: Diagnosis not present

## 2018-04-30 ENCOUNTER — Other Ambulatory Visit: Payer: Self-pay

## 2018-04-30 ENCOUNTER — Ambulatory Visit (INDEPENDENT_AMBULATORY_CARE_PROVIDER_SITE_OTHER): Payer: Medicare Other | Admitting: Internal Medicine

## 2018-04-30 ENCOUNTER — Ambulatory Visit (INDEPENDENT_AMBULATORY_CARE_PROVIDER_SITE_OTHER): Payer: Medicare Other

## 2018-04-30 ENCOUNTER — Encounter: Payer: Self-pay | Admitting: Internal Medicine

## 2018-04-30 VITALS — BP 130/76 | HR 75 | Temp 98.4°F | Ht 63.0 in | Wt 197.2 lb

## 2018-04-30 DIAGNOSIS — R7309 Other abnormal glucose: Secondary | ICD-10-CM | POA: Diagnosis not present

## 2018-04-30 DIAGNOSIS — E7800 Pure hypercholesterolemia, unspecified: Secondary | ICD-10-CM

## 2018-04-30 DIAGNOSIS — E039 Hypothyroidism, unspecified: Secondary | ICD-10-CM | POA: Diagnosis not present

## 2018-04-30 DIAGNOSIS — E6609 Other obesity due to excess calories: Secondary | ICD-10-CM

## 2018-04-30 DIAGNOSIS — Z0000 Encounter for general adult medical examination without abnormal findings: Secondary | ICD-10-CM | POA: Diagnosis not present

## 2018-04-30 DIAGNOSIS — Z6834 Body mass index (BMI) 34.0-34.9, adult: Secondary | ICD-10-CM | POA: Diagnosis not present

## 2018-04-30 NOTE — Patient Instructions (Signed)
[name] , Thank you for taking time to come for your Medicare Wellness Visit. I appreciate your ongoing commitment to your health goals. Please review the following plan we discussed and let me know if I can assist you in the future.   Screening recommendations/referrals: Colonoscopy: will do cologuard Mammogram: 02/2018 Bone Density: 03/2016 Recommended yearly ophthalmology/optometry visit for glaucoma screening and checkup Recommended yearly dental visit for hygiene and checkup  Vaccinations: Influenza vaccine: 09/2017 Pneumococcal vaccine: 05/2012 Tdap vaccine: 11/2012 Shingles vaccine: discussed    Advanced directives: Please bring a copy of your POA (Power of [place]) and/or Living Will to your next appointment.    Conditions/risks identified: Obesity  Next appointment: 11/03/2018 at 10:15   Preventive Care 65 Years and Older, Female Preventive care refers to lifestyle choices and visits with your health care provider that can promote health and wellness. What does preventive care include?  A yearly physical exam. This is also called an annual well check.  Dental exams once or twice a year.  Routine eye exams. Ask your health care provider how often you should have your eyes checked.  Personal lifestyle choices, including:  Daily care of your teeth and gums.  Regular physical activity.  Eating a healthy diet.  Avoiding tobacco and drug use.  Limiting alcohol use.  Practicing safe sex.  Taking low-dose aspirin every day.  Taking vitamin and mineral supplements as recommended by your health care provider. What happens during an annual well check? The services and screenings done by your health care provider during your annual well check will depend on your age, overall health, lifestyle risk factors, and family history of disease. Counseling  Your health care provider may ask you questions about your:  Alcohol use.  Tobacco use.  Drug use.  Emotional  well-being.  Home and relationship well-being.  Sexual activity.  Eating habits.  History of falls.  Memory and ability to understand (cognition).  Work and work Statistician.  Reproductive health. Screening  You may have the following tests or measurements:  Height, weight, and BMI.  Blood pressure.  Lipid and cholesterol levels. These may be checked every 5 years, or more frequently if you are over 69 years old.  Skin check.  Lung cancer screening. You may have this screening every year starting at age 25 if you have a 30-pack-year history of smoking and currently smoke or have quit within the past 15 years.  Fecal occult blood test (FOBT) of the stool. You may have this test every year starting at age 79.  Flexible sigmoidoscopy or colonoscopy. You may have a sigmoidoscopy every 5 years or a colonoscopy every 10 years starting at age 59.  Hepatitis C blood test.  Hepatitis B blood test.  Sexually transmitted disease (STD) testing.  Diabetes screening. This is done by checking your blood sugar (glucose) after you have not eaten for a while (fasting). You may have this done every 1-3 years.  Bone density scan. This is done to screen for osteoporosis. You may have this done starting at age 9.  Mammogram. This may be done every 1-2 years. Talk to your health care provider about how often you should have regular mammograms. Talk with your health care provider about your test results, treatment options, and if necessary, the need for more tests. Vaccines  Your health care provider may recommend certain vaccines, such as:  Influenza vaccine. This is recommended every year.  Tetanus, diphtheria, and acellular pertussis (Tdap, Td) vaccine. You may need a  Td booster every 10 years.  Zoster vaccine. You may need this after age 75.  Pneumococcal 13-valent conjugate (PCV13) vaccine. One dose is recommended after age 71.  Pneumococcal polysaccharide (PPSV23) vaccine. One  dose is recommended after age 47. Talk to your health care provider about which screenings and vaccines you need and how often you need them. This information is not intended to replace advice given to you by your health care provider. Make sure you discuss any questions you have with your health care provider. Document Released: 01/27/2015 Document Revised: 09/20/2015 Document Reviewed: 11/01/2014 Elsevier Interactive Patient Education  2017 [place] Prevention in the Home Falls can cause injuries. They can happen to people of all ages. There are many things you can do to make your home safe and to help prevent falls. What can I do on the outside of my home?  Regularly fix the edges of walkways and driveways and fix any cracks.  Remove anything that might make you trip as you walk through a door, such as a raised step or threshold.  Trim any bushes or trees on the path to your home.  Use bright outdoor lighting.  Clear any walking paths of anything that might make someone trip, such as rocks or tools.  Regularly check to see if handrails are loose or broken. Make sure that both sides of any steps have handrails.  Any raised decks and porches should have guardrails on the edges.  Have any leaves, snow, or ice cleared regularly.  Use sand or salt on walking paths during winter.  Clean up any spills in your garage right away. This includes oil or grease spills. What can I do in the bathroom?  Use night lights.  Install grab bars by the toilet and in the tub and shower. Do not use towel bars as grab bars.  Use non-skid mats or decals in the tub or shower.  If you need to sit down in the shower, use a plastic, non-slip stool.  Keep the floor dry. Clean up any water that spills on the floor as soon as it happens.  Remove soap buildup in the tub or shower regularly.  Attach bath mats securely with double-sided non-slip rug tape.  Do not have throw rugs and other  things on the floor that can make you trip. What can I do in the bedroom?  Use night lights.  Make sure that you have a light by your bed that is easy to reach.  Do not use any sheets or blankets that are too big for your bed. They should not hang down onto the floor.  Have a firm chair that has side arms. You can use this for support while you get dressed.  Do not have throw rugs and other things on the floor that can make you trip. What can I do in the kitchen?  Clean up any spills right away.  Avoid walking on wet floors.  Keep items that you use a lot in easy-to-reach places.  If you need to reach something above you, use a strong step stool that has a grab bar.  Keep electrical cords out of the way.  Do not use floor polish or wax that makes floors slippery. If you must use wax, use non-skid floor wax.  Do not have throw rugs and other things on the floor that can make you trip. What can I do with my stairs?  Do not leave any items on the stairs.  Make sure that there are handrails on both sides of the stairs and use them. Fix handrails that are broken or loose. Make sure that handrails are as long as the stairways.  Check any carpeting to make sure that it is firmly attached to the stairs. Fix any carpet that is loose or worn.  Avoid having throw rugs at the top or bottom of the stairs. If you do have throw rugs, attach them to the floor with carpet tape.  Make sure that you have a light switch at the top of the stairs and the bottom of the stairs. If you do not have them, ask someone to add them for you. What else can I do to help prevent falls?  Wear shoes that:  Do not have high heels.  Have rubber bottoms.  Are comfortable and fit you well.  Are closed at the toe. Do not wear sandals.  If you use a stepladder:  Make sure that it is fully opened. Do not climb a closed stepladder.  Make sure that both sides of the stepladder are locked into place.  Ask  someone to hold it for you, if possible.  Clearly mark and make sure that you can see:  Any grab bars or handrails.  First and last steps.  Where the edge of each step is.  Use tools that help you move around (mobility aids) if they are needed. These include:  Canes.  Walkers.  Scooters.  Crutches.  Turn on the lights when you go into a dark area. Replace any light bulbs as soon as they burn out.  Set up your furniture so you have a clear path. Avoid moving your furniture around.  If any of your floors are uneven, fix them.  If there are any pets around you, be aware of where they are.  Review your medicines with your doctor. Some medicines can make you feel dizzy. This can increase your chance of falling. Ask your doctor what other things that you can do to help prevent falls. This information is not intended to replace advice given to you by your health care provider. Make sure you discuss any questions you have with your health care provider. Document Released: 10/27/2008 Document Revised: 06/08/2015 Document Reviewed: 02/04/2014 Elsevier Interactive Patient Education  2017 [employer].

## 2018-04-30 NOTE — Patient Instructions (Signed)
Hypothyroidism  Hypothyroidism is when the thyroid gland does not make enough of certain hormones (it is underactive). The thyroid gland is a small gland located in the lower front part of the neck, just in front of the windpipe (trachea). This gland makes hormones that help control how the body uses food for energy (metabolism) as well as how the heart and brain function. These hormones also play a role in keeping your bones strong. When the thyroid is underactive, it produces too little of the hormones thyroxine (T4) and triiodothyronine (T3). What are the causes? This condition may be caused by:  Hashimoto's disease. This is a disease in which the body's disease-fighting system (immune system) attacks the thyroid gland. This is the most common cause.  Viral infections.  Pregnancy.  Certain medicines.  Birth defects.  Past radiation treatments to the head or neck for cancer.  Past treatment with radioactive iodine.  Past exposure to radiation in the environment.  Past surgical removal of part or all of the thyroid.  Problems with a gland in the center of the brain (pituitary gland).  Lack of enough iodine in the diet. What increases the risk? You are more likely to develop this condition if:  You are female.  You have a family history of thyroid conditions.  You use a medicine called lithium.  You take medicines that affect the immune system (immunosuppressants). What are the signs or symptoms? Symptoms of this condition include:  Feeling as though you have no energy (lethargy).  Not being able to tolerate cold.  Weight gain that is not explained by a change in diet or exercise habits.  Lack of appetite.  Dry skin.  Coarse hair.  Menstrual irregularity.  Slowing of thought processes.  Constipation.  Sadness or depression. How is this diagnosed? This condition may be diagnosed based on:  Your symptoms, your medical history, and a physical exam.  Blood  tests. You may also have imaging tests, such as an ultrasound or MRI. How is this treated? This condition is treated with medicine that replaces the thyroid hormones that your body does not make. After you begin treatment, it may take several weeks for symptoms to go away. Follow these instructions at home:  Take over-the-counter and prescription medicines only as told by your health care provider.  If you start taking any new medicines, tell your health care provider.  Keep all follow-up visits as told by your health care provider. This is important. ? As your condition improves, your dosage of thyroid hormone medicine may change. ? You will need to have blood tests regularly so that your health care provider can monitor your condition. Contact a health care provider if:  Your symptoms do not get better with treatment.  You are taking thyroid replacement medicine and you: ? Sweat a lot. ? Have tremors. ? Feel anxious. ? Lose weight rapidly. ? Cannot tolerate heat. ? Have emotional swings. ? Have diarrhea. ? Feel weak. Get help right away if you have:  Chest pain.  An irregular heartbeat.  A rapid heartbeat.  Difficulty breathing. Summary  Hypothyroidism is when the thyroid gland does not make enough of certain hormones (it is underactive).  When the thyroid is underactive, it produces too little of the hormones thyroxine (T4) and triiodothyronine (T3).  The most common cause is Hashimoto's disease, a disease in which the body's disease-fighting system (immune system) attacks the thyroid gland. The condition can also be caused by viral infections, medicine, pregnancy, or past   radiation treatment to the head or neck.  Symptoms may include weight gain, dry skin, constipation, feeling as though you do not have energy, and not being able to tolerate cold.  This condition is treated with medicine to replace the thyroid hormones that your body does not make. This information  is not intended to replace advice given to you by your health care provider. Make sure you discuss any questions you have with your health care provider. Document Released: 12/31/2004 Document Revised: 12/11/2016 Document Reviewed: 12/11/2016 Elsevier Interactive Patient Education  2019 Elsevier Inc.  

## 2018-04-30 NOTE — Progress Notes (Signed)
Subjective:     Patient ID: Joanne Reese , female    DOB: May 25, 1945 , 73 y.o.   MRN: [id number]   Chief Complaint  Patient presents with  . Hyperlipidemia  . Hypothyroidism    HPI  Hyperlipidemia  This is a chronic problem. The current episode started more than 1 year ago. The problem is controlled. Recent lipid tests were reviewed and are normal. Exacerbating diseases include hypothyroidism and obesity. There are no known factors aggravating her hyperlipidemia. Pertinent negatives include no chest pain or shortness of breath. Current antihyperlipidemic treatment includes statins. The current treatment provides moderate improvement of lipids. There are no compliance problems.  Risk factors for coronary artery disease include dyslipidemia, obesity and post-menopausal.  Thyroid Problem  Presents for follow-up visit. Patient reports no constipation or depressed mood. The symptoms have been stable. Her past medical history is significant for hyperlipidemia.     Past Medical History:  Diagnosis Date  . Arthritis   . Depression   . GERD (gastroesophageal reflux disease)    occ  . Hypothyroidism      History reviewed. No pertinent family history.   Current Outpatient Medications:  .  acyclovir (ZOVIRAX) 400 MG tablet, Take 400 mg by mouth 3 (three) times daily as needed (for outbreaks)., Disp: , Rfl:  .  aspirin EC 325 MG tablet, Take 1 tablet (325 mg total) by mouth 2 (two) times daily. (Patient not taking: Reported on 04/30/2018), Disp: 30 tablet, Rfl: 0 .  Calcium Carbonate-Vitamin D (CALCIUM-VITAMIN D3 PO), Take 1 tablet by mouth daily., Disp: , Rfl:  .  Cholecalciferol (VITAMIN D3 PO), Take 1 capsule by mouth daily., Disp: , Rfl:  .  methocarbamol (ROBAXIN) 500 MG tablet, Take 1-2 tablets (500-1,000 mg total) by mouth every 6 (six) hours as needed for muscle spasms. (Patient not taking: Reported on 04/30/2018), Disp: 60 tablet, Rfl: 0 .  Oxycodone HCl 10 MG TABS, Take 1  tablet (10 mg total) by mouth every 4 (four) hours as needed. (Patient not taking: Reported on 04/30/2018), Disp: 40 tablet, Rfl: 0 .  simvastatin (ZOCOR) 20 MG tablet, Take 20 mg by mouth daily. , Disp: , Rfl:  .  SYNTHROID 112 MCG tablet, Take 112 mcg by mouth daily before breakfast. , Disp: , Rfl:    Allergies  Allergen Reactions  . Nitrofurantoin Anaphylaxis  . Pneumococcal Vaccines Other (See Comments)    LOCAL REACTION Redness & Pain around injection site  Pneumoxvac 23 (active) per patient report     Review of Systems  Constitutional: Negative.   Respiratory: Negative.  Negative for shortness of breath.   Cardiovascular: Negative.  Negative for chest pain.  Gastrointestinal: Negative.  Negative for constipation.  Neurological: Negative.   Psychiatric/Behavioral: Negative.      Today's Vitals   04/30/18 0908  BP: 130/76  Pulse: 75  Temp: 98.4 F (36.9 C)  TempSrc: Oral  Weight: 197 lb 3.2 oz (89.4 kg)  Height: '5\' 3"'  (1.6 m)  PainSc: 0-No pain   Body mass index is 34.93 kg/m.   Objective:  Physical Exam Vitals signs and nursing note reviewed.  Constitutional:      Appearance: Normal appearance.  HENT:     Head: Normocephalic and atraumatic.  Cardiovascular:     Rate and Rhythm: Normal rate and regular rhythm.     Heart sounds: Normal heart sounds.  Pulmonary:     Effort: Pulmonary effort is normal.     Breath sounds: Normal breath sounds.  Skin:    General: Skin is warm.  Neurological:     General: No focal deficit present.     Mental Status: She is alert.  Psychiatric:        Mood and Affect: Mood normal.        Behavior: Behavior normal.         Assessment And Plan:     1. Primary hypothyroidism  I will check a thyroid panel and adjust meds as needed. She will rto in six months for re-evaluation.   - TSH - T4, Free  2. Pure hypercholesterolemia  I will check fasting lipid panel today. She is encouraged to avoid fried foods, exercise 150  min/week and increase her fiber intake.   - Lipid panel - CMP14+EGFR  3. Other abnormal glucose  HER A1C HAS BEEN ELEVATED IN THE PAST. I WILL CHECK AN A1C, BMET TODAY. SHE WAS ENCOURAGED TO AVOID SUGARY BEVERAGES AND PROCESSED FOODS INCLUDNG BREADS, RICE AND PASTA.  - Hemoglobin A1c  4. Class 1 obesity due to excess calories with serious comorbidity and body mass index (BMI) of 34.0 to 34.9 in adult  Importance of achieving optimal weight to decrease risk of cardiovascular disease and cancers was discussed with the patient in full detail. She is encouraged to start slowly - start with 10 minutes twice daily at least three to four days per week and to gradually build to 30 minutes five days weekly. She was given tips to incorporate more activity into her daily routine - take stairs when possible, park farther away from grocery stores, etc.      [name], MD    THE PATIENT IS ENCOURAGED TO PRACTICE SOCIAL DISTANCING DUE TO THE COVID-19 PANDEMIC.

## 2018-04-30 NOTE — Progress Notes (Signed)
Subjective:   Joanne Reese is a 73 y.o. female who presents for Medicare Annual (Subsequent) preventive examination.  Review of Systems:  n/a Cardiac Risk Factors include: advanced age (>34men, >23 women);dyslipidemia     Objective:     Vitals: BP 130/76 (BP Location: Left Arm, Patient Position: Sitting)   Pulse 75   Temp 98.4 F (36.9 C) (Oral)   Ht 5\' 3"  (1.6 m)   Wt 197 lb 3.2 oz (89.4 kg)   LMP  (LMP Unknown)   SpO2 96%   BMI 34.93 kg/m   Body mass index is 34.93 kg/m.  Advanced Directives 04/30/2018 02/10/2017 01/28/2017  Does Patient Have a Medical Advance Directive? Yes No Yes  Type of Paramedic of [place];Living will - -  Copy of [place] in Chart? No - copy requested - -  Would patient like information on creating a medical advance directive? - No - Patient declined -    Tobacco Social History   Tobacco Use  Smoking Status Never Smoker  Smokeless Tobacco Never Used     Counseling given: Not Answered   Clinical Intake:  Pre-visit preparation completed: Yes  Pain : No/denies pain Pain Score: 0-No pain     Nutritional Status: BMI > 30  Obese Nutritional Risks: None Diabetes: No  How often do you need to have someone help you when you read instructions, pamphlets, or other written materials from your doctor or pharmacy?: 1 - Never What is the last grade level you completed in school?: 12th grade  Interpreter Needed?: No  Information entered by :: NAllen LPN  Past Medical History:  Diagnosis Date  . Arthritis   . Depression   . GERD (gastroesophageal reflux disease)    occ  . Hypothyroidism    Past Surgical History:  Procedure Laterality Date  . CYST EXCISION     ovary  30+ yrs  . PARTIAL KNEE ARTHROPLASTY Right 02/10/2017   Procedure: RIGHT UNICOMPARTMENTAL KNEE;  Surgeon: Vickey Huger, MD;  Location: [facility];  Service: Orthopedics;  Laterality: Right;  . THYROID LOBECTOMY     No family  history on file. Social History   Socioeconomic History  . Marital status: Widowed    Spouse name: Not on file  . Number of children: Not on file  . Years of education: Not on file  . Highest education level: Not on file  Occupational History  . Occupation: retired  Scientific laboratory technician  . Financial resource strain: Not hard at all  . Food insecurity:    Worry: Never true    Inability: Never true  . Transportation needs:    Medical: No    Non-medical: No  Tobacco Use  . Smoking status: Never Smoker  . Smokeless tobacco: Never Used  Substance and Sexual Activity  . Alcohol use: Not Currently    Comment: occ  . Drug use: No  . Sexual activity: Not Currently  Lifestyle  . Physical activity:    Days per week: 7 days    Minutes per session: 40 min  . Stress: Not at all  Relationships  . Social connections:    Talks on phone: Not on file    Gets together: Not on file    Attends religious service: Not on file    Active member of club or organization: Not on file    Attends meetings of clubs or organizations: Not on file    Relationship status: Not on file  Other Topics  Concern  . Not on file  Social History Narrative  . Not on file    Outpatient Encounter Medications as of 04/30/2018  Medication Sig  . acyclovir (ZOVIRAX) 400 MG tablet Take 400 mg by mouth 3 (three) times daily as needed (for outbreaks).  . Cholecalciferol (VITAMIN D3 PO) Take 1 capsule by mouth daily.  . simvastatin (ZOCOR) 20 MG tablet Take 20 mg by mouth daily.   Marland Kitchen SYNTHROID 112 MCG tablet Take 112 mcg by mouth daily before breakfast.   . aspirin EC 325 MG tablet Take 1 tablet (325 mg total) by mouth 2 (two) times daily. (Patient not taking: Reported on 04/30/2018)  . Calcium Carbonate-Vitamin D (CALCIUM-VITAMIN D3 PO) Take 1 tablet by mouth daily.  . methocarbamol (ROBAXIN) 500 MG tablet Take 1-2 tablets (500-1,000 mg total) by mouth every 6 (six) hours as needed for muscle spasms. (Patient not taking:  Reported on 04/30/2018)  . Oxycodone HCl 10 MG TABS Take 1 tablet (10 mg total) by mouth every 4 (four) hours as needed. (Patient not taking: Reported on 04/30/2018)   No facility-administered encounter medications on file as of 04/30/2018.     Activities of Daily Living In your present state of health, do you have any difficulty performing the following activities: 04/30/2018  Hearing? N  Vision? N  Difficulty concentrating or making decisions? N  Walking or climbing stairs? N  Dressing or bathing? N  Preparing Food and eating ? N  Using the Toilet? N  In the past six months, have you accidently leaked urine? N  Do you have problems with loss of bowel control? N  Managing your Medications? N  Managing your Finances? N  Housekeeping or managing your Housekeeping? N  Some recent data might be hidden    Patient Care Team: Glendale Chard, MD as PCP - General (Internal Medicine) Warden Fillers, MD as Consulting Physician (Ophthalmology)    Assessment:   This is a routine wellness examination for [place].  Exercise Activities and Dietary recommendations Current Exercise Habits: Home exercise routine, Type of exercise: walking, Time (Minutes): 45, Frequency (Times/Week): 7, Weekly Exercise (Minutes/Week): 315, Intensity: Mild  Goals    . Patient Stated     none       Fall Risk Fall Risk  04/30/2018 08/05/2016  Falls in the past year? 0 No  Comment - Emmi Telephone Survey: data to providers prior to load  Follow up Education provided;Falls prevention discussed -   Is the patient's home free of loose throw rugs in walkways, pet beds, electrical cords, etc?   yes      Grab bars in the bathroom? no      Handrails on the stairs?   yes      Adequate lighting?   yes  Timed Get Up and Go performed: n/a  Depression Screen PHQ 2/9 Scores 04/30/2018  PHQ - 2 Score 1  PHQ- 9 Score 1     Cognitive Function     6CIT Screen 04/30/2018  What Year? 0 points  What month? 0  points  What time? 0 points  Count back from 20 0 points  Months in reverse 0 points  Repeat phrase 0 points  Total Score 0     There is no immunization history on file for this patient.  Qualifies for Shingles Vaccine? yes  Screening Tests Health Maintenance  Topic Date Due  . COLONOSCOPY  03/06/1997  . PNA vac Low Risk Adult (1 of 2 - PCV13) 03/06/2012  .  INFLUENZA VACCINE  08/15/2018  . MAMMOGRAM  02/15/2020  . TETANUS/TDAP  11/27/2022  . DEXA SCAN  Completed  . Hepatitis C Screening  Completed    Cancer Screenings: Lung: Low Dose CT Chest recommended if Age 44-80 years, 30 pack-year currently smoking OR have quit w/in 15years. Patient does not qualify. Breast:  Up to date on Mammogram? Yes   Up to date of Bone Density/Dexa? Yes Colorectal: will do cologuard  Additional Screenings:  Hepatitis C Screening: 02/20/2016 <0.1     Plan:    Has no goals set.   I have personally reviewed and noted the following in the patient's chart:   . Medical and social history . Use of alcohol, tobacco or illicit drugs  . Current medications and supplements . Functional ability and status . Nutritional status . Physical activity . Advanced directives . List of other physicians . Hospitalizations, surgeries, and ER visits in previous 12 months . Vitals . Screenings to include cognitive, depression, and falls . Referrals and appointments  In addition, I have reviewed and discussed with patient certain preventive protocols, quality metrics, and best practice recommendations. A written personalized care plan for preventive services as well as general preventive health recommendations were provided to patient.     Kellie Simmering, LPN  3/81/8299

## 2018-05-01 LAB — CMP14+EGFR
ALT: 20 IU/L (ref 0–32)
AST: 19 IU/L (ref 0–40)
Albumin/Globulin Ratio: 1.5 (ref 1.2–2.2)
Albumin: 4.1 g/dL (ref 3.7–4.7)
Alkaline Phosphatase: 108 IU/L (ref 39–117)
BUN/Creatinine Ratio: 10 — ABNORMAL LOW (ref 12–28)
BUN: 9 mg/dL (ref 8–27)
Bilirubin Total: 0.4 mg/dL (ref 0.0–1.2)
CO2: 23 mmol/L (ref 20–29)
Calcium: 8.8 mg/dL (ref 8.7–10.3)
Chloride: 107 mmol/L — ABNORMAL HIGH (ref 96–106)
Creatinine, Ser: 0.86 mg/dL (ref 0.57–1.00)
GFR calc Af Amer: 79 mL/min/{1.73_m2} (ref >59)
GFR calc non Af Amer: 68 mL/min/{1.73_m2} (ref >59)
Globulin, Total: 2.8 g/dL (ref 1.5–4.5)
Glucose: 111 mg/dL — ABNORMAL HIGH (ref 65–99)
Potassium: 4.8 mmol/L (ref 3.5–5.2)
Sodium: 147 mmol/L — ABNORMAL HIGH (ref 134–144)
Total Protein: 6.9 g/dL (ref 6.0–8.5)

## 2018-05-01 LAB — LIPID PANEL
Chol/HDL Ratio: 3.4 ratio (ref 0.0–4.4)
Cholesterol, Total: 157 mg/dL (ref 100–199)
HDL: 46 mg/dL (ref >39)
LDL Calculated: 89 mg/dL (ref 0–99)
Triglycerides: 111 mg/dL (ref 0–149)
VLDL Cholesterol Cal: 22 mg/dL (ref 5–40)

## 2018-05-01 LAB — HEMOGLOBIN A1C
Est. average glucose Bld gHb Est-mCnc: 126 mg/dL
Hgb A1c MFr Bld: 6 % — ABNORMAL HIGH (ref 4.8–5.6)

## 2018-05-01 LAB — T4, FREE: Free T4: 1.25 ng/dL (ref 0.82–1.77)

## 2018-05-01 LAB — TSH: TSH: 1.71 u[IU]/mL (ref 0.450–4.500)

## 2018-05-26 ENCOUNTER — Encounter: Payer: Self-pay | Admitting: Internal Medicine

## 2018-05-26 NOTE — Progress Notes (Signed)
2 polyps in the transverse and 3 polyps in the rectum, each 1-2 mm, removed with cold foreceps. Medium internal hemorrhoids were noted on retroflex view

## 2018-06-02 DIAGNOSIS — H04123 Dry eye syndrome of bilateral lacrimal glands: Secondary | ICD-10-CM | POA: Diagnosis not present

## 2018-06-02 DIAGNOSIS — D3132 Benign neoplasm of left choroid: Secondary | ICD-10-CM | POA: Diagnosis not present

## 2018-06-02 DIAGNOSIS — H2513 Age-related nuclear cataract, bilateral: Secondary | ICD-10-CM | POA: Diagnosis not present

## 2018-06-02 DIAGNOSIS — H5203 Hypermetropia, bilateral: Secondary | ICD-10-CM | POA: Diagnosis not present

## 2018-06-02 DIAGNOSIS — H52201 Unspecified astigmatism, right eye: Secondary | ICD-10-CM | POA: Diagnosis not present

## 2018-06-02 DIAGNOSIS — H524 Presbyopia: Secondary | ICD-10-CM | POA: Diagnosis not present

## 2018-07-14 DIAGNOSIS — G8929 Other chronic pain: Secondary | ICD-10-CM | POA: Diagnosis not present

## 2018-07-14 DIAGNOSIS — M25562 Pain in left knee: Secondary | ICD-10-CM | POA: Diagnosis not present

## 2018-08-16 ENCOUNTER — Other Ambulatory Visit: Payer: Self-pay | Admitting: Internal Medicine

## 2018-09-30 DIAGNOSIS — Z23 Encounter for immunization: Secondary | ICD-10-CM | POA: Diagnosis not present

## 2018-10-12 DIAGNOSIS — R6883 Chills (without fever): Secondary | ICD-10-CM | POA: Diagnosis not present

## 2018-10-12 DIAGNOSIS — J0110 Acute frontal sinusitis, unspecified: Secondary | ICD-10-CM | POA: Diagnosis not present

## 2018-10-13 DIAGNOSIS — R05 Cough: Secondary | ICD-10-CM | POA: Diagnosis not present

## 2018-10-13 DIAGNOSIS — Z20828 Contact with and (suspected) exposure to other viral communicable diseases: Secondary | ICD-10-CM | POA: Diagnosis not present

## 2018-10-19 ENCOUNTER — Other Ambulatory Visit: Payer: Self-pay

## 2018-10-19 ENCOUNTER — Telehealth (INDEPENDENT_AMBULATORY_CARE_PROVIDER_SITE_OTHER): Payer: Medicare Other | Admitting: Nurse Practitioner

## 2018-10-19 ENCOUNTER — Encounter: Payer: Self-pay | Admitting: Nurse Practitioner

## 2018-10-19 DIAGNOSIS — R05 Cough: Secondary | ICD-10-CM

## 2018-10-19 DIAGNOSIS — B342 Coronavirus infection, unspecified: Secondary | ICD-10-CM

## 2018-10-19 DIAGNOSIS — U071 COVID-19: Secondary | ICD-10-CM

## 2018-10-19 DIAGNOSIS — R059 Cough, unspecified: Secondary | ICD-10-CM

## 2018-10-19 MED ORDER — AZITHROMYCIN 250 MG PO TABS: ORAL_TABLET | ORAL | 0 refills | Status: AC

## 2018-10-19 MED ORDER — PREDNISONE 20 MG PO TABS: 20.0000 mg | ORAL_TABLET | Freq: Every day | ORAL | 0 refills | Status: DC

## 2018-10-19 MED ORDER — HYDROCODONE-HOMATROPINE 5-1.5 MG/5ML PO SYRP
5.0000 mL | ORAL_SOLUTION | Freq: Four times a day (QID) | ORAL | 0 refills | Status: DC | PRN
Start: 2018-10-19 — End: 2018-11-19

## 2018-10-19 NOTE — Progress Notes (Signed)
Virtual Visit via Telephone   This visit type was conducted due to national recommendations for restrictions regarding the COVID-19 Pandemic (e.g. social distancing) in an effort to limit this patient's exposure and mitigate transmission in our community.  Due to her co-morbid illnesses, this patient is at least at moderate risk for complications without adequate follow up.  This format is felt to be most appropriate for this patient at this time.  All issues noted in this document were discussed and addressed.  A limited physical exam was performed with this format.    This visit type was conducted due to national recommendations for restrictions regarding the COVID-19 Pandemic (e.g. social distancing) in an effort to limit this patient's exposure and mitigate transmission in our community.  Patients identity confirmed using two different identifiers.  This format is felt to be most appropriate for this patient at this time.  All issues noted in this document were discussed and addressed.  No physical exam was performed (except for noted visual exam findings with Video Visits).    Date:  10/25/2018   ID:  Joanne Reese, DOB 1945-03-31, MRN [id number]  Patient Location:  Home - spoke with Lucia Estelle  Provider location:   Office    Chief Complaint:  cough  History of Present Illness:    ALEXIANNA CELLI is a 73 y.o. female who presents via video conferencing for a telehealth visit today.    The patient does have symptoms concerning for COVID-19 infection (fever, chills, cough, or new shortness of breath).   HPI  She went to urgent care on Tues coronavirus.  She feels today is 10 or 11th day.  She had the health department to call to tylenol and mucinex DM.  She is having intermittent fever.  She denies having shortness of breath.  She has loss of taste.  She tried to go to the emergency room last night so EMS came and evaluated did not feel she would be admitted.     Past Medical History:  Diagnosis Date  . Arthritis   . Depression   . GERD (gastroesophageal reflux disease)    occ  . Hypothyroidism    Past Surgical History:  Procedure Laterality Date  . CYST EXCISION     ovary  30+ yrs  . PARTIAL KNEE ARTHROPLASTY Right 02/10/2017   Procedure: RIGHT UNICOMPARTMENTAL KNEE;  Surgeon: Vickey Huger, MD;  Location: [facility];  Service: Orthopedics;  Laterality: Right;  . THYROID LOBECTOMY       Current Meds  Medication Sig  . acyclovir (ZOVIRAX) 400 MG tablet Take 400 mg by mouth 3 (three) times daily as needed (for outbreaks).  . Cholecalciferol (VITAMIN D3 PO) Take 1 capsule by mouth daily.  . simvastatin (ZOCOR) 20 MG tablet TAKE 1 TABLET BY MOUTH IN THE EVENING  . SYNTHROID 112 MCG tablet Take 112 mcg by mouth daily before breakfast.      Allergies:   Nitrofurantoin and Pneumococcal vaccines   Social History   Tobacco Use  . Smoking status: Never Smoker  . Smokeless tobacco: Never Used  Substance Use Topics  . Alcohol use: Not Currently    Comment: occ  . Drug use: No     Family Hx: The patient's family history is not on file.  ROS:   Please see the history of present illness.    ROS  All other systems reviewed and are negative.   Labs/Other Tests and Data Reviewed:    Recent Labs: 04/30/2018:  ALT 20; BUN 9; Creatinine, Ser 0.86; Potassium 4.8; Sodium 147; TSH 1.710   Recent Lipid Panel Lab Results  Component Value Date/Time   CHOL 157 04/30/2018 10:34 AM   TRIG 111 04/30/2018 10:34 AM   HDL 46 04/30/2018 10:34 AM   CHOLHDL 3.4 04/30/2018 10:34 AM   LDLCALC 89 04/30/2018 10:34 AM    Wt Readings from Last 3 Encounters:  04/30/18 197 lb 3.2 oz (89.4 kg)  04/30/18 197 lb 3.2 oz (89.4 kg)  02/10/17 203 lb (92.1 kg)     Exam:    Vital Signs:  LMP  (LMP Unknown)     Physical Exam  Constitutional: No distress.  Pulmonary/Chest:  When speaking she has increased coughing  Psychiatric: Mood, memory, affect and judgment  normal.    ASSESSMENT & PLAN:    1. Cough  She has persistent coughing and had been diagnosed with coronavirus about 5 days ago  Will treat with antibiotics and steroids - azithromycin (ZITHROMAX Z-PAK) 250 MG tablet; Take 2 tablets (500 mg) on  Day 1,  followed by 1 tablet (250 mg) once daily on Days 2 through 5.  Dispense: 6 each; Refill: 0 - predniSONE (DELTASONE) 20 MG tablet; Take 1 tablet (20 mg total) by mouth daily with breakfast.  Dispense: 21 tablet; Refill: 0 - HYDROcodone-homatropine (HYDROMET) 5-1.5 MG/5ML syrup; Take 5 mLs by mouth every 6 (six) hours as needed.  Dispense: 120 mL; Refill: 0  2. Coronavirus infection  Reports she was diagnosed in [place] if she has shortness of breath to go to the ER for further evaluation - azithromycin (ZITHROMAX Z-PAK) 250 MG tablet; Take 2 tablets (500 mg) on  Day 1,  followed by 1 tablet (250 mg) once daily on Days 2 through 5.  Dispense: 6 each; Refill: 0 - predniSONE (DELTASONE) 20 MG tablet; Take 1 tablet (20 mg total) by mouth daily with breakfast.  Dispense: 21 tablet; Refill: 0 - HYDROcodone-homatropine (HYDROMET) 5-1.5 MG/5ML syrup; Take 5 mLs by mouth every 6 (six) hours as needed.  Dispense: 120 mL; Refill: 0   COVID-19 Education: The signs and symptoms of COVID-19 were discussed with the patient and how to seek care for testing (follow up with PCP or arrange E-visit).  The importance of social distancing was discussed today.  Patient Risk:   After full review of this patients clinical status, I feel that they are at least moderate risk at this time.  Time:   Today, I have spent 12 minutes/ seconds with the patient with telehealth technology discussing above diagnoses.     Medication Adjustments/Labs and Tests Ordered: Current medicines are reviewed at length with the patient today.  Concerns regarding medicines are outlined above.   Tests Ordered: No orders of the defined types were placed in this  encounter.   Medication Changes: Meds ordered this encounter  Medications  . azithromycin (ZITHROMAX Z-PAK) 250 MG tablet    Sig: Take 2 tablets (500 mg) on  Day 1,  followed by 1 tablet (250 mg) once daily on Days 2 through 5.    Dispense:  6 each    Refill:  0  . predniSONE (DELTASONE) 20 MG tablet    Sig: Take 1 tablet (20 mg total) by mouth daily with breakfast.    Dispense:  21 tablet    Refill:  0  . HYDROcodone-homatropine (HYDROMET) 5-1.5 MG/5ML syrup    Sig: Take 5 mLs by mouth every 6 (six) hours as needed.  Dispense:  120 mL    Refill:  0    Disposition:  Follow up prn  Signed, Minette Brine, FNP

## 2018-10-27 DIAGNOSIS — Z20828 Contact with and (suspected) exposure to other viral communicable diseases: Secondary | ICD-10-CM | POA: Diagnosis not present

## 2018-11-02 ENCOUNTER — Other Ambulatory Visit: Payer: Self-pay | Admitting: Nurse Practitioner

## 2018-11-02 DIAGNOSIS — B342 Coronavirus infection, unspecified: Secondary | ICD-10-CM

## 2018-11-02 DIAGNOSIS — R05 Cough: Secondary | ICD-10-CM

## 2018-11-02 DIAGNOSIS — R059 Cough, unspecified: Secondary | ICD-10-CM

## 2018-11-03 ENCOUNTER — Ambulatory Visit: Payer: Medicare Other | Admitting: Internal Medicine

## 2018-11-10 ENCOUNTER — Telehealth: Payer: Self-pay

## 2018-11-10 NOTE — Telephone Encounter (Signed)
Pt called asking for an appt to have her lungs checked out because she has tested multiple times with COVID last test 10/27/2018 (positive) pt was offered a virtual appt per Minette Brine. Pt refused to have virtual appt, because of language that was used by the patient call was forwarded to [facility]

## 2018-11-11 DIAGNOSIS — D2262 Melanocytic nevi of left upper limb, including shoulder: Secondary | ICD-10-CM | POA: Diagnosis not present

## 2018-11-11 DIAGNOSIS — L918 Other hypertrophic disorders of the skin: Secondary | ICD-10-CM | POA: Diagnosis not present

## 2018-11-11 DIAGNOSIS — D1801 Hemangioma of skin and subcutaneous tissue: Secondary | ICD-10-CM | POA: Diagnosis not present

## 2018-11-11 DIAGNOSIS — L814 Other melanin hyperpigmentation: Secondary | ICD-10-CM | POA: Diagnosis not present

## 2018-11-11 DIAGNOSIS — D225 Melanocytic nevi of trunk: Secondary | ICD-10-CM | POA: Diagnosis not present

## 2018-11-11 DIAGNOSIS — L821 Other seborrheic keratosis: Secondary | ICD-10-CM | POA: Diagnosis not present

## 2018-11-11 DIAGNOSIS — L820 Inflamed seborrheic keratosis: Secondary | ICD-10-CM | POA: Diagnosis not present

## 2018-11-12 DIAGNOSIS — Z808 Family history of malignant neoplasm of other organs or systems: Secondary | ICD-10-CM | POA: Diagnosis not present

## 2018-11-12 DIAGNOSIS — E890 Postprocedural hypothyroidism: Secondary | ICD-10-CM | POA: Diagnosis not present

## 2018-11-17 ENCOUNTER — Telehealth: Payer: Self-pay | Admitting: Internal Medicine

## 2018-11-17 NOTE — Telephone Encounter (Signed)
Left message to call back  

## 2018-11-18 NOTE — Telephone Encounter (Signed)
No need for televisit for these symptoms, needs ov with all meds in hand same time slot fine

## 2018-11-18 NOTE — Telephone Encounter (Signed)
Called and spoke with pt letting her know the info stated by [facility] that we should get her scheduled for a televisit to further see what would be recommended to help with her symptoms and pt verbalized understanding.  Pt has been scheduled for a televisit with MW tomorrow, 11/5 at 1045. Nothing further needed.

## 2018-11-18 NOTE — Telephone Encounter (Signed)
Called pt and advised message from the provider. Pt understood and verbalized understanding. Nothing further is needed.    

## 2018-11-18 NOTE — Telephone Encounter (Signed)
D/t persistent symptoms and covid positive last month, recommend televisit first and can precede from there. If she is adament about coming in then will need to be seen in sick room and provider would need to be made aware before seeing patient.

## 2018-11-18 NOTE — Telephone Encounter (Addendum)
Called spoke with patient who reports recent positive COVID test on 10.1.2020 (this is the date she received the results, test was done on 9/27).  Patient is still having lingering discomfort when she inhales deeply and cough.  Patient stated that she has been cleared by "public health."  Advised patient that appt is needed (last office visit 07/2016) and recommended telephone visit to speak with Dr Melvyn Novas and then decision can be made about how to proceed with further workup (face-to-face visit, come in for cxr, etc).  Patient adamant about coming in to the office.  Dr Melvyn Novas is not in the office this afternoon.  Will route to APP of the day.  Beth, please advise if patient may come in for face-to-face or televisit.  Thank you.

## 2018-11-19 ENCOUNTER — Other Ambulatory Visit: Payer: Self-pay

## 2018-11-19 ENCOUNTER — Encounter: Payer: Self-pay | Admitting: Internal Medicine

## 2018-11-19 ENCOUNTER — Ambulatory Visit (INDEPENDENT_AMBULATORY_CARE_PROVIDER_SITE_OTHER): Payer: Medicare Other

## 2018-11-19 ENCOUNTER — Ambulatory Visit (INDEPENDENT_AMBULATORY_CARE_PROVIDER_SITE_OTHER): Payer: Medicare Other | Admitting: Internal Medicine

## 2018-11-19 VITALS — BP 124/70 | HR 87 | Ht 63.0 in | Wt 192.8 lb

## 2018-11-19 DIAGNOSIS — R05 Cough: Secondary | ICD-10-CM | POA: Diagnosis not present

## 2018-11-19 DIAGNOSIS — R079 Chest pain, unspecified: Secondary | ICD-10-CM | POA: Diagnosis not present

## 2018-11-19 DIAGNOSIS — R059 Cough, unspecified: Secondary | ICD-10-CM

## 2018-11-19 DIAGNOSIS — R0600 Dyspnea, unspecified: Secondary | ICD-10-CM | POA: Diagnosis not present

## 2018-11-19 DIAGNOSIS — R0609 Other forms of dyspnea: Secondary | ICD-10-CM

## 2018-11-19 DIAGNOSIS — R058 Other specified cough: Secondary | ICD-10-CM | POA: Insufficient documentation

## 2018-11-19 LAB — BASIC METABOLIC PANEL
BUN: 9 mg/dL (ref 6–23)
CO2: 29 mEq/L (ref 19–32)
Calcium: 9.5 mg/dL (ref 8.4–10.5)
Chloride: 105 mEq/L (ref 96–112)
Creatinine, Ser: 0.87 mg/dL (ref 0.40–1.20)
GFR: 64.06 mL/min (ref >60.00)
Glucose, Bld: 101 mg/dL — ABNORMAL HIGH (ref 70–99)
Potassium: 4.6 mEq/L (ref 3.5–5.1)
Sodium: 141 mEq/L (ref 135–145)

## 2018-11-19 LAB — CBC WITH DIFFERENTIAL/PLATELET
Basophils Absolute: 0.1 10*3/uL (ref 0.0–0.1)
Basophils Relative: 0.8 % (ref 0.0–3.0)
Eosinophils Absolute: 0.1 10*3/uL (ref 0.0–0.7)
Eosinophils Relative: 1.7 % (ref 0.0–5.0)
HCT: 40.6 % (ref 36.0–46.0)
Hemoglobin: 12.8 g/dL (ref 12.0–15.0)
Lymphocytes Relative: 22.7 % (ref 12.0–46.0)
Lymphs Abs: 1.5 10*3/uL (ref 0.7–4.0)
MCHC: 31.6 g/dL (ref 30.0–36.0)
MCV: 79.8 fl (ref 78.0–100.0)
Monocytes Absolute: 0.7 10*3/uL (ref 0.1–1.0)
Monocytes Relative: 10.6 % (ref 3.0–12.0)
Neutro Abs: 4.3 10*3/uL (ref 1.4–7.7)
Neutrophils Relative %: 64.2 % (ref 43.0–77.0)
Platelets: 247 10*3/uL (ref 150.0–400.0)
RBC: 5.09 Mil/uL (ref 3.87–5.11)
RDW: 20.7 % — ABNORMAL HIGH (ref 11.5–15.5)
WBC: 6.7 10*3/uL (ref 4.0–10.5)

## 2018-11-19 LAB — D-DIMER, QUANTITATIVE (NOT AT ARMC): D-Dimer, Quant: 1.3 mcg/mL FEU — ABNORMAL HIGH (ref <0.50)

## 2018-11-19 LAB — TSH: TSH: 0.84 u[IU]/mL (ref 0.35–4.50)

## 2018-11-19 LAB — SEDIMENTATION RATE: Sed Rate: 11 mm/hr (ref 0–30)

## 2018-11-19 LAB — BRAIN NATRIURETIC PEPTIDE: Pro B Natriuretic peptide (BNP): 32 pg/mL (ref 0.0–100.0)

## 2018-11-19 MED ORDER — PANTOPRAZOLE SODIUM 40 MG PO TBEC: 40.0000 mg | DELAYED_RELEASE_TABLET | Freq: Every day | ORAL | 2 refills | Status: DC

## 2018-11-19 MED ORDER — TRAMADOL HCL 50 MG PO TABS: 50.0000 mg | ORAL_TABLET | ORAL | 0 refills | Status: AC | PRN

## 2018-11-19 MED ORDER — FAMOTIDINE 20 MG PO TABS: ORAL_TABLET | ORAL | 11 refills | Status: DC

## 2018-11-19 MED ORDER — PREDNISONE 10 MG PO TABS: ORAL_TABLET | ORAL | 0 refills | Status: DC

## 2018-11-19 NOTE — Assessment & Plan Note (Signed)
Onset with covid [id number]  - cyclical cough rx [id number] >>>   Of the three most common causes of  Sub-acute / recurrent or chronic cough, only one (GERD)  can actually contribute to/ trigger  the other two (asthma and post nasal drip syndrome)  and perpetuate the cylce of cough.  While not intuitively obvious, many patients with chronic low grade reflux do not cough until there is a primary insult that disturbs the protective epithelial barrier and exposes sensitive nerve endings.   This is typically viral but can due to PNDS and  either may apply here.      >>> The point is that once this occurs, it is difficult to eliminate the cycle  using anything but a maximally effective acid suppression regimen at least in the short run, accompanied by an appropriate diet to address non acid GERD and control / eliminate the cough itself for at least 3 days with tramadol  Also added 6 days of Prednisone in case of component of Th-2 driven upper or lower airways inflammation (if cough responds short term only to relapse befor return while will on rx for uacs that would point to allergic rhinitis/ asthma or eos bronchitis)     >>> f/u in 2 weeks with all meds in hand using a trust but verify approach to confirm accurate Medication  Reconciliation The principal here is that until we are certain that the  patients are doing what we've asked, it makes no sense to ask them to do more.    I had an extended discussion with the patient reviewing all relevant studies completed to date and  lasting  25 minutes of a 40 minute acute office visit which included directly observing ambulatory 02 saturation study documented in a/p section of  today's  office note.  Each maintenance medication was reviewed in detail including most importantly the difference between maintenance and prns and under what circumstances the prns are to be triggered using an action plan format that is not reflected in the computer generated  alphabetically organized AVS.     Please see AVS for specific instructions unique to this visit that I personally wrote and verbalized to the the pt in detail and then reviewed with pt  by my nurse highlighting any changes in therapy recommended at today's visit .

## 2018-11-19 NOTE — Progress Notes (Signed)
Subjective:     Patient ID: Joanne Reese, female   DOB: Feb 11, 1945,   MRN: [id number]    Brief patient profile:  65 yowf never smoker freq travel to [facility] to visit son with onset of cough when boarded in [place] in March 2018  then couldn't stop coughing after arrival to [facility] and so several days after arriving cxr > CTa 04/04/16 due to wearing boot on arrival  > 4 mm so referred to pulmonary clinic 08/05/2016 by Dr   Joanne Reese in [place]    History of Present Illness  08/05/2016 1st [facility] office visit/ Joanne Reese   Chief Complaint  Patient presents with  . Pulmonary Consult    Self referral for eval of pulmonary nodule.  Pt states went to ER in [place] in 04/04/16 for eval of cough. CTA was done to r/o PE b/c of recent travel- neg for PE.    rx zpak/ tessalon and cough resolved within a few days  rec Fleischner Society recommendations for incidental pulmonary nodules for you = no more scans needed Please call for cough/ chest pain with breathing or unexplained shortness of breath   11/19/2018 acute extended ov/Joanne Reese re: new sob/cough p covid 19  Chief Complaint  Patient presents with  . Acute Visit    Pt tested positive for covid 9/29. Pt has been having complaints of SOB, pain in lungs, and occ cough.    Acute onset  10/09/2018 chills/pnds in [place] seen 9/28 UC rx as sinus infections and tested POS 10/13/2018 zpak / prednisone x 21 day x 10 mg per day s taper with some  improvement in all symptoms except sob /cough and anterior gen discomfort from coughing.   Dyspnea:  yardwork x 20 min,  Ok flat and nl pace  Cough: dry, more at hs  Sleeping: ok flat SABA use: none  02: none   No obvious day to day or daytime variability or assoc excess/ purulent sputum or mucus plugs or hemoptysis or  chest tightness, subjective wheeze or overt sinus or hb symptoms.   Sleeping as above  without nocturnal  or early am exacerbation  of respiratory  c/o's or need for noct saba.  Also denies any obvious fluctuation of symptoms with weather or environmental changes or other aggravating or alleviating factors except as outlined above   No unusual exposure hx or h/o childhood pna/ asthma or knowledge of premature birth.  Current Allergies, Complete Past Medical History, Past Surgical History, Family History, and Social History were reviewed in [employer] record.  ROS  The following are not active complaints unless bolded Hoarseness, sore throat, dysphagia, dental problems, itching, sneezing,  nasal congestion or discharge of excess mucus or purulent secretions, ear ache,   fever, chills, sweats, unintended wt loss or wt gain, classically pleuritic or exertional cp,  orthopnea pnd or arm/hand swelling  or leg swelling, presyncope, palpitations, abdominal pain, anorexia, nausea, vomiting, diarrhea  or change in bowel habits or change in bladder habits, change in stools or change in urine, dysuria, hematuria,  rash, arthralgias, visual complaints, headache, numbness, weakness or ataxia or problems with walking or coordination,  change in mood or  memory.        Current Meds  Medication Sig  . acyclovir (ZOVIRAX) 400 MG tablet Take 400 mg by mouth 3 (three) times daily as needed (for outbreaks).  . Calcium Carbonate-Vitamin D (CALCIUM-VITAMIN D3 PO) Take 1 tablet by mouth daily.  . Cholecalciferol (  VITAMIN D3 PO) Take 1 capsule by mouth daily.  . simvastatin (ZOCOR) 20 MG tablet TAKE 1 TABLET BY MOUTH IN THE EVENING  . SYNTHROID 112 MCG tablet Take 112 mcg by mouth daily before breakfast.             Objective:   Physical Exam   Vital signs reviewed - Note on arrival 02 sats  97% on RA     11/19/2018       192   08/05/16 195 lb (88.5 kg)  12/06/13 192 lb (87.1 kg)  12/06/13 192 lb (87.1 kg)     amb wf nad   HEENT : pt wearing mask not removed for exam due to covid -19 concerns.    NECK :  without JVD/Nodes/TM/ nl carotid upstrokes  bilaterally   LUNGS: no acc muscle use,  Nl contour chest with minimal crackles in bases bilaterally without cough on insp or exp maneuvers   CV:  RRR  no s3 or murmur or increase in P2, and no edema   ABD:  soft and nontender with nl inspiratory excursion in the supine position. No bruits or organomegaly appreciated, bowel sounds nl  MS:  Nl gait/ ext warm without deformities, calf tenderness, cyanosis or clubbing No obvious joint restrictions   SKIN: warm and dry without lesions    NEURO:  alert, approp, nl sensorium with  no motor or cerebellar deficits apparent.     Labs ordered/ reviewed:      Chemistry      Component Value Date/Time   NA 141 11/19/2018 1146   NA 147 (H) 04/30/2018 1034   K 4.6 11/19/2018 1146   CL 105 11/19/2018 1146   CO2 29 11/19/2018 1146   BUN 9 11/19/2018 1146   BUN 9 04/30/2018 1034   CREATININE 0.87 11/19/2018 1146      Component Value Date/Time   CALCIUM 9.5 11/19/2018 1146   ALKPHOS 108 04/30/2018 1034   AST 19 04/30/2018 1034   ALT 20 04/30/2018 1034   BILITOT 0.4 04/30/2018 1034        Lab Results  Component Value Date   WBC 6.7 11/19/2018   HGB 12.8 11/19/2018   HCT 40.6 11/19/2018   MCV 79.8 11/19/2018   PLT 247.0 11/19/2018       EOS                                                               0.1                                    11/19/2018    Lab Results  Component Value Date   DDIMER 1.30 (H) 11/19/2018      Lab Results  Component Value Date   TSH 0.84 11/19/2018     Lab Results  Component Value Date   PROBNP 32.0 11/19/2018       Lab Results  Component Value Date   ESRSEDRATE 11 11/19/2018       CXR PA and Lateral:   11/19/2018 :    I personally reviewed images and agree with radiology impression as follows:    Suspect underlying fibrotic type change. Areas of atelectatic change and  scarring. No frank consolidation or airspace opacity. Cardiac silhouette normal.  No adenopathy  evident.   Assessment:

## 2018-11-19 NOTE — Patient Instructions (Signed)
The key to effective treatment for your cough is eliminating the non-stop cycle of cough you're stuck in long enough to let your airway heal completely and then see if there is anything still making you cough once you stop the cough suppression, but this should take no more than 5 days to figure out  First take delsym two tsp every 12 hours and supplement if needed with  tramadol 50 mg up to 1-2 every 4 hours to suppress the urge to cough at all or even clear your throat. Swallowing water or using ice chips/non mint and menthol containing candies (such as lifesavers or sugarless jolly ranchers) are also effective.  You should rest your voice and avoid activities that you know make you cough.  Once you have eliminated the cough for 3 straight days try reducing the tramadol first,  then the delsym as tolerated.    Prednisone 10 mg take  4 each am x 2 days,   2 each am x 2 days,  1 each am x 2 days and stop (this is to eliminate allergies and inflammation from coughing)  Protonix (pantoprazole) Take 30-60 min before first meal of the day and Pepcid 20 mg one bedtime until return     GERD (REFLUX)  is an extremely common cause of respiratory symptoms, many times with no significant heartburn at all.    It can be treated with medication, but also with lifestyle changes including avoidance of late meals, excessive alcohol, smoking cessation, and avoid fatty foods, chocolate, peppermint, colas, red wine, and acidic juices such as orange juice.  NO MINT OR MENTHOL PRODUCTS SO NO COUGH DROPS   USE HARD CANDY INSTEAD (jolley ranchers or Stover's or Lifesavers (all available in sugarless versions) NO OIL BASED VITAMINS - use powdered substitutes.  Please remember to go to the lab department   for your tests - we will call you with the results when they are available.       Please schedule a follow up office visit in 2 weeks, sooner if needed  with all medications /inhalers/ solutions in hand so we can  verify exactly what you are taking. This includes all medications from all doctors and over the counters

## 2018-11-19 NOTE — Assessment & Plan Note (Addendum)
Onset with covid 19 documented 10/13/18  -  11/19/2018   Walked RA  2 laps @  approx 257ft each @ moderate pace  stopped due to  Sob with sats 92% - 11/19/2018 dimer  1.30  - CTa 11/19/2018 >>>   All studies look good x for the abn cxr (expected) and the d dimer (also expected s/p covid but also worrisome for late complications like PE)  Discussed in detail all the  indications, usual  risks and alternatives  relative to the benefits with patient who agrees to proceed with w/u with CTa .

## 2018-11-20 ENCOUNTER — Ambulatory Visit
Admission: RE | Admit: 2018-11-20 | Discharge: 2018-11-20 | Disposition: A | Discharge status: Home / Self Care | Payer: Medicare Other | Source: Ambulatory Visit | Attending: Internal Medicine | Admitting: Internal Medicine

## 2018-11-20 ENCOUNTER — Telehealth: Payer: Self-pay | Admitting: Internal Medicine

## 2018-11-20 ENCOUNTER — Other Ambulatory Visit: Payer: Self-pay | Admitting: Internal Medicine

## 2018-11-20 DIAGNOSIS — R0602 Shortness of breath: Secondary | ICD-10-CM | POA: Diagnosis not present

## 2018-11-20 DIAGNOSIS — R7989 Other specified abnormal findings of blood chemistry: Secondary | ICD-10-CM | POA: Diagnosis not present

## 2018-11-20 MED ORDER — IOPAMIDOL (ISOVUE-370) INJECTION 76%
75.0000 mL | Freq: Once | INTRAVENOUS | Status: AC | PRN
  Administered 2018-11-20: 14:00:00 75 mL via INTRAVENOUS

## 2018-11-20 NOTE — Telephone Encounter (Signed)
See result note for CTa 11/20/2018

## 2018-11-20 NOTE — Progress Notes (Signed)
Spoke with pt and notified of results per Dr. Melvyn Novas. Pt verbalized understanding and denied any questions. CTA ord

## 2018-11-20 NOTE — Progress Notes (Signed)
Spoke with pt and notified of results per Dr. Wert. Pt verbalized understanding and denied any questions. 

## 2018-11-20 NOTE — Telephone Encounter (Signed)
Spoke with [facility]  No PE  I advised pt can go and we will call her with results

## 2018-12-01 DIAGNOSIS — Z20828 Contact with and (suspected) exposure to other viral communicable diseases: Secondary | ICD-10-CM | POA: Diagnosis not present

## 2018-12-01 DIAGNOSIS — J3489 Other specified disorders of nose and nasal sinuses: Secondary | ICD-10-CM | POA: Diagnosis not present

## 2018-12-03 ENCOUNTER — Ambulatory Visit: Payer: Medicare Other | Admitting: Internal Medicine

## 2018-12-03 ENCOUNTER — Ambulatory Visit (INDEPENDENT_AMBULATORY_CARE_PROVIDER_SITE_OTHER): Payer: Medicare Other | Admitting: Internal Medicine

## 2018-12-03 ENCOUNTER — Encounter: Payer: Self-pay | Admitting: Internal Medicine

## 2018-12-03 ENCOUNTER — Other Ambulatory Visit: Payer: Self-pay

## 2018-12-03 DIAGNOSIS — R911 Solitary pulmonary nodule: Secondary | ICD-10-CM | POA: Diagnosis not present

## 2018-12-03 DIAGNOSIS — R0600 Dyspnea, unspecified: Secondary | ICD-10-CM | POA: Diagnosis not present

## 2018-12-03 DIAGNOSIS — R05 Cough: Secondary | ICD-10-CM

## 2018-12-03 DIAGNOSIS — R0609 Other forms of dyspnea: Secondary | ICD-10-CM

## 2018-12-03 DIAGNOSIS — R058 Other specified cough: Secondary | ICD-10-CM

## 2018-12-03 NOTE — Patient Instructions (Addendum)
Stop protonix now and take pepcid 20 mg after after bfast and supper for 2 weeks then just after supper x 2 weeks then stop - if the cough recurs > restart the protonix   GERD (REFLUX)  is an extremely common cause of respiratory symptoms just like yours , many times with no obvious heartburn at all.    It can be treated with medication, but also with lifestyle changes including elevation of the head of your bed (ideally with 6 -8inch blocks under the headboard of your bed),  Smoking cessation, avoidance of late meals, excessive alcohol, and avoid fatty foods, chocolate, peppermint, colas, red wine, and acidic juices such as orange juice.  NO MINT OR MENTHOL PRODUCTS SO NO COUGH DROPS  USE SUGARLESS CANDY INSTEAD (Jolley ranchers or Stover's or Life Savers) or even ice chips will also do - the key is to swallow to prevent all throat clearing. NO OIL BASED VITAMINS - use powdered substitutes.  Avoid fish oil when coughing.  Keep up the walking >  Make sure you check your oxygen saturations at highest level of activity to be sure it stays over 90%     Please schedule a follow up visit in 6 months but call sooner if needed with pfts

## 2018-12-03 NOTE — Progress Notes (Signed)
Subjective:     Patient ID: Joanne Reese, female   DOB: 09-Apr-1945,   MRN: [id number]    Brief patient profile:  89 yowf never smoker freq travel to [facility] to visit son with onset of cough when boarded in [place] in March 2018  then couldn't stop coughing after arrival to [facility] and so several days after arriving cxr > CTa 04/04/16 no PE  due to wearing boot on arrival  > 4 mm so referred to pulmonary clinic 08/05/2016 by Dr   Robbie Lis in [place]    History of Present Illness  08/05/2016 1st [facility] office visit/ [name]   Chief Complaint  Patient presents with  . Pulmonary Consult    Self referral for eval of pulmonary nodule.  Pt states went to ER in [place] in 04/04/16 for eval of cough. CTA was done to r/o PE b/c of recent travel- neg for PE.    rx zpak/ tessalon and cough resolved within a few days  rec Fleischner Society recommendations for incidental pulmonary nodules for you = no more scans needed Please call for cough/ chest pain with breathing or unexplained shortness of breath   11/19/2018 acute extended ov/[name] re: new sob/cough p covid 19  Chief Complaint  Patient presents with  . Acute Visit    Pt tested positive for covid 9/29. Pt has been having complaints of SOB, pain in lungs, and occ cough.    Acute onset  10/09/2018 chills/pnds in [place] seen 9/28 UC rx as sinus infections and tested POS 10/13/2018 zpak / prednisone x 21 day x 10 mg per day s taper with some  improvement in all symptoms except sob /cough and anterior gen discomfort from coughing.   Dyspnea:  yardwork x 20 min,  Ok flat and nl pace  Cough: dry, more at hs  Sleeping: ok flat SABA use: none  02: none  rec  First take delsym two tsp every 12 hours and supplement if needed with  tramadol 50 mg up to 1-2 every 4 hours to suppress the urge to cough at all or even clear your throat. Once you have eliminated the cough for 3 straight days try reducing the tramadol first,  then the  delsym as tolerated.   Prednisone 10 mg take  4 each am x 2 days,   2 each am x 2 days,  1 each am x 2 days and stop (this is to eliminate allergies and inflammation from coughing) Protonix (pantoprazole) Take 30-60 min before first meal of the day and Pepcid 20 mg one bedtime until return   GERD diet    12/03/2018  f/u ov/[name] re:  Post covid 10/09/2018  Chief Complaint  Patient presents with  . Follow-up    Breathing has improved some and she is no longer coughing.   Dyspnea:  Walking with friend qod x 50 min mostly flat and can't talk full sentences while walking but trend is better / still some sense of midline chest discomfort with deep breath  Cough: gone  Sleeping: able to lie flat, one pillow SABA use: none  02: none    No obvious day to day or daytime variability or assoc excess/ purulent sputum or mucus plugs or hemoptysis or   chest tightness, subjective wheeze or overt sinus or hb symptoms.   Sleeping as above without nocturnal  or early am exacerbation  of respiratory  c/o's or need for noct saba. Also denies any obvious fluctuation of  symptoms with weather or environmental changes or other aggravating or alleviating factors except as outlined above   No unusual exposure hx or h/o childhood pna/ asthma or knowledge of premature birth.  Current Allergies, Complete Past Medical History, Past Surgical History, Family History, and Social History were reviewed in [employer] record.  ROS  The following are not active complaints unless bolded Hoarseness, sore throat, dysphagia, dental problems, itching, sneezing,  nasal congestion or discharge of excess mucus or purulent secretions, ear ache,   fever, chills, sweats, unintended wt loss or wt gain, classically pleuritic or exertional cp,  orthopnea pnd or arm/hand swelling  or leg swelling, presyncope, palpitations, abdominal pain, anorexia, nausea, vomiting, diarrhea  or change in bowel habits or change in  bladder habits, change in stools or change in urine, dysuria, hematuria,  rash, arthralgias, visual complaints, headache, numbness, weakness or ataxia or problems with walking or coordination,  change in mood or  memory.        Current Meds  Medication Sig  . acyclovir (ZOVIRAX) 400 MG tablet Take 400 mg by mouth 3 (three) times daily as needed (for outbreaks).  . Cholecalciferol (VITAMIN D3 PO) Take 1 capsule by mouth daily.  . famotidine (PEPCID) 20 MG tablet One after supper  . pantoprazole (PROTONIX) 40 MG tablet Take 1 tablet (40 mg total) by mouth daily. Take 30-60 min before first meal of the day  . simvastatin (ZOCOR) 20 MG tablet TAKE 1 TABLET BY MOUTH IN THE EVENING  . SYNTHROID 112 MCG tablet Take 112 mcg by mouth daily before breakfast.                   Objective:   Physical Exam  Pleasant wf nad    12/03/2018      194   11/19/2018       192   08/05/16 195 lb (88.5 kg)  12/06/13 192 lb (87.1 kg)  12/06/13 192 lb (87.1 kg)      Vital signs reviewed - Note on arrival 02 sats  100% on RA   HEENT : pt wearing mask not removed for exam due to covid -19 concerns.    NECK :  without JVD/Nodes/TM/ nl carotid upstrokes bilaterally   LUNGS: no acc muscle use,  Nl contour chest with minimal insp crackles in bases bilaterally without cough on insp or exp maneuvers   CV:  RRR  no s3 or murmur or increase in P2, and no edema   ABD: obese/  soft and nontender with nl inspiratory excursion in the supine position. No bruits or organomegaly appreciated, bowel sounds nl  MS:  Nl gait/ ext warm without deformities, calf tenderness, cyanosis or clubbing No obvious joint restrictions   SKIN: warm and dry without lesions    NEURO:  alert, approp, nl sensorium with  no motor or cerebellar deficits apparent.         I personally reviewed images and agree with radiology impression as follows:   Chest CTa  11/20/2018   1.  Negative examination for pulmonary embolism.  2.  There is extensive peripheral irregular interstitial opacity, some degree of architectural distortion, and no significant bronchiectasis or bronchiolectasis appreciated. There is some evidence of subpleural sparing and a clear apical to basal gradient. Findings are consistent with moderate pulmonary fibrosis, possibly with a degree of superimposed acute infectious or inflammatory airspace disease, and new when compared to remote prior examination dated 2008.   3. There are multiple bilateral  pulmonary nodules, including a 6 mm nodule of the right pulmonary apex (series 11, image 19), a 1.0 cm nodule of the right lung base (series 11, image 96), as well as additional smaller, benign nodules stable compared to remote prior examination dated 2008. Non-contrast chest CT at 3-6 months  My impression:  C/w PF related to ALI from COVID 19 pna   Assessment:

## 2018-12-04 ENCOUNTER — Encounter: Payer: Self-pay | Admitting: Internal Medicine

## 2018-12-04 NOTE — Assessment & Plan Note (Signed)
Onset with covid 19 documented 10/13/18  -  11/19/2018   Walked RA  2 laps @  approx 251ft each @ moderate pace  stopped due to  Sob with sats 92% - 11/19/2018 dimer =  1.30  - CTa 11/20/2018  > c/w covid 19 prior infection  Improving but needs to keep track of 02 sats at peak ex to be sure the trend continues

## 2018-12-04 NOTE — Assessment & Plan Note (Signed)
Onset with covid [id number]  - cyclical cough rx [id number] >>> resolved 12/03/2018   Discussed the recent press about ppi's in the context of a statistically significant (but questionably clinically relevant) increase in CRI in pts on ppi vs h2's > bottom line is the lowest dose of ppi that controls   gerd is the right dose and if that dose is zero that's fine esp since h2's are cheaper so try to wean off    I had an extended discussion with the patient reviewing all relevant studies completed to date and  lasting 15 to 20 minutes of a 25 minute visit    Each maintenance medication was reviewed in detail including most importantly the difference between maintenance and prns and under what circumstances the prns are to be triggered using an action plan format that is not reflected in the computer generated alphabetically organized AVS.     Please see AVS for specific instructions unique to this visit that I personally wrote and verbalized to the the pt in detail and then reviewed with pt  by my nurse highlighting any  changes in therapy recommended at today's visit to their plan of care.

## 2018-12-04 NOTE — Assessment & Plan Note (Addendum)
See CT chest from [facility] 04/04/16 = 4 mm LUL - see post covid CT 11/20/2018 nodules as large as 6 mm > reminder file for 04/20/19   CT results reviewed with pt >>> Too small for PET or bx, not suspicious enough for excisional bx > really only option for now is follow the Fleischner society guidelines as rec by radiology.   Not sure these are the same nodules but will need f/u either way at 6 m/ placed in reminder file for recall  Discussed in detail all the  indications, usual  risks and alternatives  relative to the benefits with patient who agrees to proceed with conservative f/u as outlined

## 2019-02-09 ENCOUNTER — Other Ambulatory Visit: Payer: Self-pay | Admitting: Internal Medicine

## 2019-02-17 DIAGNOSIS — Z20828 Contact with and (suspected) exposure to other viral communicable diseases: Secondary | ICD-10-CM | POA: Diagnosis not present

## 2019-03-03 DIAGNOSIS — Z1231 Encounter for screening mammogram for malignant neoplasm of breast: Secondary | ICD-10-CM | POA: Diagnosis not present

## 2019-03-29 DIAGNOSIS — Z01419 Encounter for gynecological examination (general) (routine) without abnormal findings: Secondary | ICD-10-CM | POA: Diagnosis not present

## 2019-04-15 ENCOUNTER — Other Ambulatory Visit: Payer: Self-pay | Admitting: Internal Medicine

## 2019-04-15 DIAGNOSIS — M1712 Unilateral primary osteoarthritis, left knee: Secondary | ICD-10-CM | POA: Diagnosis not present

## 2019-04-15 DIAGNOSIS — R911 Solitary pulmonary nodule: Secondary | ICD-10-CM

## 2019-05-04 ENCOUNTER — Encounter: Payer: Medicare Other | Admitting: Internal Medicine

## 2019-05-04 ENCOUNTER — Ambulatory Visit: Payer: Medicare Other

## 2019-05-17 ENCOUNTER — Other Ambulatory Visit: Payer: Self-pay

## 2019-05-17 ENCOUNTER — Ambulatory Visit
Admission: RE | Admit: 2019-05-17 | Discharge: 2019-05-17 | Disposition: A | Discharge status: Home / Self Care | Payer: Medicare Other | Source: Ambulatory Visit | Attending: Internal Medicine | Admitting: Internal Medicine

## 2019-05-17 DIAGNOSIS — R918 Other nonspecific abnormal finding of lung field: Secondary | ICD-10-CM | POA: Diagnosis not present

## 2019-05-17 DIAGNOSIS — R911 Solitary pulmonary nodule: Secondary | ICD-10-CM

## 2019-05-17 NOTE — Progress Notes (Signed)
Spoke with pt and notified of results per Dr. Wert. Pt verbalized understanding and denied any questions. 

## 2019-05-19 ENCOUNTER — Encounter: Payer: Self-pay | Admitting: Internal Medicine

## 2019-05-19 ENCOUNTER — Other Ambulatory Visit: Payer: Self-pay

## 2019-05-19 ENCOUNTER — Ambulatory Visit (INDEPENDENT_AMBULATORY_CARE_PROVIDER_SITE_OTHER): Payer: Medicare Other | Admitting: Internal Medicine

## 2019-05-19 ENCOUNTER — Ambulatory Visit (INDEPENDENT_AMBULATORY_CARE_PROVIDER_SITE_OTHER): Payer: Medicare Other

## 2019-05-19 VITALS — BP 130/86 | HR 83 | Temp 98.2°F | Ht 63.0 in | Wt 195.0 lb

## 2019-05-19 VITALS — BP 130/86 | HR 83 | Temp 98.2°F | Ht 63.0 in | Wt 195.8 lb

## 2019-05-19 DIAGNOSIS — E7800 Pure hypercholesterolemia, unspecified: Secondary | ICD-10-CM

## 2019-05-19 DIAGNOSIS — M791 Myalgia, unspecified site: Secondary | ICD-10-CM | POA: Diagnosis not present

## 2019-05-19 DIAGNOSIS — Z6834 Body mass index (BMI) 34.0-34.9, adult: Secondary | ICD-10-CM | POA: Diagnosis not present

## 2019-05-19 DIAGNOSIS — Z0000 Encounter for general adult medical examination without abnormal findings: Secondary | ICD-10-CM | POA: Diagnosis not present

## 2019-05-19 DIAGNOSIS — I700 Atherosclerosis of aorta: Secondary | ICD-10-CM | POA: Diagnosis not present

## 2019-05-19 DIAGNOSIS — E039 Hypothyroidism, unspecified: Secondary | ICD-10-CM | POA: Diagnosis not present

## 2019-05-19 DIAGNOSIS — R7309 Other abnormal glucose: Secondary | ICD-10-CM | POA: Diagnosis not present

## 2019-05-19 DIAGNOSIS — Z8616 Personal history of COVID-19: Secondary | ICD-10-CM

## 2019-05-19 DIAGNOSIS — E6609 Other obesity due to excess calories: Secondary | ICD-10-CM | POA: Diagnosis not present

## 2019-05-19 NOTE — Progress Notes (Signed)
This visit occurred during the SARS-CoV-2 public health emergency.  Safety protocols were in place, including screening questions prior to the visit, additional usage of staff PPE, and extensive cleaning of exam room while observing appropriate contact time as indicated for disinfecting solutions.  Subjective:     Patient ID: Joanne Reese , female    DOB: March 23, 1945 , 74 y.o.   MRN: [id number]   Chief Complaint  Patient presents with  . Hypothyroidism    HPI  She is here today for thyroid check. She reports compliance with meds. She reports having COVID Fall 2020. She reports it took her "awhile" to get over it. She was evaluated by Pulmonary and advised that she had scarring in her lungs. She adds that she has switched to generic levothyroxine since her last visit.  States her insurance no longer covers Synthroid.   Thyroid Problem Presents for follow-up visit. Symptoms include depressed mood. Patient reports no cold intolerance, constipation, hoarse voice, leg swelling or menstrual problem.     Past Medical History:  Diagnosis Date  . Arthritis   . Depression   . GERD (gastroesophageal reflux disease)    occ  . Hypothyroidism      Family History  Problem Relation Age of Onset  . Emphysema Father      Current Outpatient Medications:  .  acyclovir (ZOVIRAX) 400 MG tablet, Take 400 mg by mouth 3 (three) times daily as needed (for outbreaks)., Disp: , Rfl:  .  Biotin 10000 MCG TABS, Take by mouth. daily, Disp: , Rfl:  .  Cholecalciferol (VITAMIN D3) 50 MCG (2000 UT) capsule, Take 1 capsule by mouth daily. , Disp: , Rfl:  .  ELDERBERRY PO, Take 1,250 mg PE by mouth., Disp: , Rfl:  .  simvastatin (ZOCOR) 20 MG tablet, TAKE 1 TABLET BY MOUTH IN THE EVENING, Disp: 90 tablet, Rfl: 2 .  SYNTHROID 112 MCG tablet, Take 112 mcg by mouth daily before breakfast. , Disp: , Rfl:    Allergies  Allergen Reactions  . Nitrofurantoin Anaphylaxis  . Pneumococcal Vaccines Other (See  Comments)    LOCAL REACTION Redness & Pain around injection site  Pneumoxvac 23 (active) per patient report     Review of Systems  Constitutional: Negative.   HENT: Negative.  Negative for hoarse voice.   Eyes: Negative.   Respiratory: Negative.   Cardiovascular: Negative.   Gastrointestinal: Negative for constipation.  Endocrine: Negative.  Negative for cold intolerance.  Genitourinary: Negative.  Negative for menstrual problem.  Musculoskeletal: Positive for myalgias.       She c/o a tightness located below her buttocks. Occurs when seated for long periods of time. Denies back pain and LE paresthesias. Unable to identify any other triggers.   Skin: Negative.   Allergic/Immunologic: Negative.   Neurological: Negative.   Hematological: Negative.   Psychiatric/Behavioral: Positive for dysphoric mood.       States she has been feeling depressed. Wants to see a counselor. She has been thinking about her husband who is deceased. She lives alone.      Today's Vitals   05/19/19 1427  BP: 130/86  Pulse: 83  Temp: 98.2 F (36.8 C)  TempSrc: Oral  Weight: 195 lb 12.8 oz (88.8 kg)  Height: '5\' 3"'  (1.6 m)  PainSc: 3   PainLoc: Buttocks   Body mass index is 34.68 kg/m.   Objective:  Physical Exam Vitals and nursing note reviewed.  Constitutional:      Appearance: Normal appearance.  HENT:  Head: Normocephalic and atraumatic.     Right Ear: Tympanic membrane, ear canal and external ear normal.     Left Ear: Tympanic membrane, ear canal and external ear normal.     Nose:     Comments: Deferred, masked    Mouth/Throat:     Comments: Deferred, masked Eyes:     Extraocular Movements: Extraocular movements intact.     Conjunctiva/sclera: Conjunctivae normal.     Pupils: Pupils are equal, round, and reactive to light.  Cardiovascular:     Rate and Rhythm: Normal rate and regular rhythm.     Pulses: Normal pulses.     Heart sounds: Normal heart sounds.  Pulmonary:      Effort: Pulmonary effort is normal.     Breath sounds: Normal breath sounds.  Abdominal:     General: Abdomen is flat. Bowel sounds are normal.     Palpations: Abdomen is soft.  Genitourinary:    Comments: deferred Musculoskeletal:        General: Normal range of motion.     Cervical back: Normal range of motion and neck supple.     Comments: Posterior thighs non-tender to palpation.   Skin:    General: Skin is warm and dry.  Neurological:     General: No focal deficit present.     Mental Status: She is alert and oriented to person, place, and time.  Psychiatric:        Mood and Affect: Mood normal.        Behavior: Behavior normal.         Assessment And Plan:     1. Primary hypothyroidism  I will check thyroid panel and adjust meds as needed.  - TSH - T4, Free  2. Pure hypercholesterolemia  Chronic, I will check fasting lipid panel and labs listed below. She is encouraged to avoid fried foods, and to incorporate more exercise into her daily routine. She will rto in six months for re-evaluation.   - CMP14+EGFR - CBC - Lipid panel  3. Other abnormal glucose  HER A1C HAS BEEN ELEVATED IN THE PAST. I WILL CHECK AN A1C, BMET TODAY. SHE WAS ENCOURAGED TO AVOID SUGARY BEVERAGES AND PROCESSED FOODS INCLUDNG BREADS, RICE AND PASTA.  - Hemoglobin A1c  4. Aortic atherosclerosis (HCC)  Seen on CT scan. Importance of statin compliance was discussed with the patient. She is also encouraged to follow a heart healthy diet.   5. Myalgia  I will check CK levels. She is encouraged to stay well hydrated. Also advised that she would likely benefit from magnesium supplementation.   - CK, total  6. Class 1 obesity due to excess calories with serious comorbidity and body mass index (BMI) of 34.0 to 34.9 in adult  She is encouraged to strive for BMI less than 30 to decrease cardiac risk. She is advised to aim for at least 150 minutes of exercise per week.   7. Personal history of  covid-19  [name], MD    THE PATIENT IS ENCOURAGED TO PRACTICE SOCIAL DISTANCING DUE TO THE COVID-19 PANDEMIC.

## 2019-05-19 NOTE — Progress Notes (Signed)
This visit occurred during the SARS-CoV-2 public health emergency.  Safety protocols were in place, including screening questions prior to the visit, additional usage of staff PPE, and extensive cleaning of exam room while observing appropriate contact time as indicated for disinfecting solutions.  Subjective:   Joanne Reese is a 74 y.o. female who presents for Medicare Annual (Subsequent) preventive examination.  Review of Systems:  n/a Cardiac Risk Factors include: advanced age (>40men, >32 women);dyslipidemia;obesity (BMI >30kg/m2);sedentary lifestyle     Objective:     Vitals: BP 130/86 (BP Location: Left Arm, Patient Position: Sitting, Cuff Size: Normal)   Pulse 83   Temp 98.2 F (36.8 C) (Oral)   Ht 5\' 3"  (1.6 m)   Wt 195 lb (88.5 kg)   LMP  (LMP Unknown)   BMI 34.54 kg/m   Body mass index is 34.54 kg/m.  Advanced Directives 05/19/2019 04/30/2018 02/10/2017 01/28/2017  Does Patient Have a Medical Advance Directive? Yes Yes No Yes  Type of Paramedic of [place];Living will [place];Living will - -  Copy of [place] in Chart? No - copy requested No - copy requested - -  Would patient like information on creating a medical advance directive? - - No - Patient declined -    Tobacco Social History   Tobacco Use  Smoking Status Never Smoker  Smokeless Tobacco Never Used     Counseling given: Not Answered   Clinical Intake:     Pain : 0-10 Pain Score: 3  Pain Type: Acute pain Pain Orientation: Right, Left Pain Descriptors / Indicators: Aching Pain Onset: More than a month ago Pain Frequency: Intermittent Pain Relieving Factors: special pad  Pain Relieving Factors: special pad  Nutritional Status: BMI > 30  Obese Nutritional Risks: None Diabetes: No  How often do you need to have someone help you when you read instructions, pamphlets, or other written materials from your doctor or pharmacy?:  1 - Never What is the last grade level you completed in school?: 12th grade  Interpreter Needed?: No  Information entered by :: NAllen LPN  Past Medical History:  Diagnosis Date  . Arthritis   . Depression   . GERD (gastroesophageal reflux disease)    occ  . Hypothyroidism    Past Surgical History:  Procedure Laterality Date  . CYST EXCISION     ovary  30+ yrs  . PARTIAL KNEE ARTHROPLASTY Right 02/10/2017   Procedure: RIGHT UNICOMPARTMENTAL KNEE;  Surgeon: Vickey Huger, MD;  Location: [facility];  Service: Orthopedics;  Laterality: Right;  . THYROID LOBECTOMY     Family History  Problem Relation Age of Onset  . Emphysema Father    Social History   Socioeconomic History  . Marital status: Widowed    Spouse name: Not on file  . Number of children: Not on file  . Years of education: Not on file  . Highest education level: Not on file  Occupational History  . Occupation: retired  Tobacco Use  . Smoking status: Never Smoker  . Smokeless tobacco: Never Used  Substance and Sexual Activity  . Alcohol use: Not Currently    Comment: occ  . Drug use: No  . Sexual activity: Not Currently  Other Topics Concern  . Not on file  Social History Narrative  . Not on file   Social Determinants of Health   Financial Resource Strain: Low Risk   . Difficulty of Paying Living Expenses: Not hard at all  Food Insecurity: No Food Insecurity  . Worried About Charity fundraiser in the Last Year: Never true  . Ran Out of Food in the Last Year: Never true  Transportation Needs: No Transportation Needs  . Lack of Transportation (Medical): No  . Lack of Transportation (Non-Medical): No  Physical Activity: Insufficiently Active  . Days of Exercise per Week: 3 days  . Minutes of Exercise per Session: 30 min  Stress: No Stress Concern Present  . Feeling of Stress : Not at all  Social Connections:   . Frequency of Communication with Friends and Family:   . Frequency of Social Gatherings with  Friends and Family:   . Attends Religious Services:   . Active Member of Clubs or Organizations:   . Attends Archivist Meetings:   Marland Kitchen Marital Status:     Outpatient Encounter Medications as of 05/19/2019  Medication Sig  . acyclovir (ZOVIRAX) 400 MG tablet Take 400 mg by mouth 3 (three) times daily as needed (for outbreaks).  . Cholecalciferol (VITAMIN D3) 50 MCG (2000 UT) capsule Take 1 capsule by mouth daily.   . simvastatin (ZOCOR) 20 MG tablet TAKE 1 TABLET BY MOUTH IN THE EVENING  . SYNTHROID 112 MCG tablet Take 112 mcg by mouth daily before breakfast.    No facility-administered encounter medications on file as of 05/19/2019.    Activities of Daily Living In your present state of health, do you have any difficulty performing the following activities: 05/19/2019 05/19/2019  Hearing? N N  Vision? N N  Difficulty concentrating or making decisions? N N  Walking or climbing stairs? N N  Comment - arthritis in left knee  Dressing or bathing? N N  Doing errands, shopping? N N  Preparing Food and eating ? N -  Using the Toilet? N -  In the past six months, have you accidently leaked urine? N -  Do you have problems with loss of bowel control? N -  Managing your Medications? N -  Managing your Finances? N -  Housekeeping or managing your Housekeeping? N -  Some recent data might be hidden    Patient Care Team: Joanne Chard, MD as PCP - General (Internal Medicine) Joanne Fillers, MD as Consulting Physician (Ophthalmology)    Assessment:   This is a routine wellness examination for Joanne Reese.  Exercise Activities and Dietary recommendations Current Exercise Habits: Home exercise routine, Type of exercise: walking, Time (Minutes): 30, Frequency (Times/Week): 3, Weekly Exercise (Minutes/Week): 90  Goals    . Patient Stated     none    . Weight (lb) < 200 lb (90.7 kg)     05/19/2019, wants to lose 20 pounds       Fall Risk Fall Risk  05/19/2019 05/19/2019 04/30/2018  08/05/2016  Falls in the past year? 0 0 0 No  Comment - - - Emmi Telephone Survey: data to providers prior to load  Follow up Falls evaluation completed;Education provided;Falls prevention discussed - Education provided;Falls prevention discussed -   Is the patient's home free of loose throw rugs in walkways, pet beds, electrical cords, etc?   yes      Grab bars in the bathroom? no      Handrails on the stairs?   yes      Adequate lighting?   yes  Timed Get Up and Go performed: n/a  Depression Screen PHQ 2/9 Scores 05/19/2019 04/30/2018  PHQ - 2 Score 4 1  PHQ- 9 Score 5 1  Exception Documentation Other- indicate reason in comment box -  Not completed just completed by Physician -     Cognitive Function     6CIT Screen 05/19/2019 04/30/2018  What Year? 0 points 0 points  What month? 0 points 0 points  What time? 0 points 0 points  Count back from 20 0 points 0 points  Months in reverse 0 points 0 points  Repeat phrase 0 points 0 points  Total Score 0 0    Immunization History  Administered Date(s) Administered  . Fluad Quad(high Dose 65+) 09/30/2018  . Moderna SARS-COVID-2 Vaccination 02/25/2019, 03/22/2019    Qualifies for Shingles Vaccine? yes  Screening Tests Health Maintenance  Topic Date Due  . PNA vac Low Risk Adult (1 of 2 - PCV13) 05/18/2020 (Originally 03/06/2012)  . INFLUENZA VACCINE  08/15/2019  . MAMMOGRAM  02/15/2020  . TETANUS/TDAP  11/27/2022  . COLONOSCOPY  02/03/2023  . DEXA SCAN  Completed  . COVID-19 Vaccine  Completed  . Hepatitis C Screening  Completed    Cancer Screenings: Lung: Low Dose CT Chest recommended if Age 64-80 years, 30 pack-year currently smoking OR have quit w/in 15years. Patient does not qualify. Breast:  Up to date on Mammogram? Yes   Up to date of Bone Density/Dexa? Yes Colorectal: up to date  Additional Screenings: : Hepatitis C Screening: 03/01/2016     Plan:    Patient wants to lose 20 pounds.   I have personally  reviewed and noted the following in the patient's chart:   . Medical and social history . Use of alcohol, tobacco or illicit drugs  . Current medications and supplements . Functional ability and status . Nutritional status . Physical activity . Advanced directives . List of other physicians . Hospitalizations, surgeries, and ER visits in previous 12 months . Vitals . Screenings to include cognitive, depression, and falls . Referrals and appointments  In addition, I have reviewed and discussed with patient certain preventive protocols, quality metrics, and best practice recommendations. A written personalized care plan for preventive services as well as general preventive health recommendations were provided to patient.     Kellie Simmering, LPN  [id number]

## 2019-05-19 NOTE — Patient Instructions (Signed)
[name] , Thank you for taking time to come for your Medicare Wellness Visit. I appreciate your ongoing commitment to your health goals. Please review the following plan we discussed and let me know if I can assist you in the future.   Screening recommendations/referrals: Colonoscopy: 01/2013 Mammogram: 02/2018 Bone Density: 03/2016 Recommended yearly ophthalmology/optometry visit for glaucoma screening and checkup Recommended yearly dental visit for hygiene and checkup  Vaccinations: Influenza vaccine: 09/2018 Pneumococcal vaccine: allergy Tdap vaccine: 11/2012 Shingles vaccine: discussed    Advanced directives: Please bring a copy of your POA (Power of [place]) and/or Living Will to your next appointment.   Conditions/risks identified: obesity  Next appointment:    Preventive Care 72 Years and Older, Female Preventive care refers to lifestyle choices and visits with your health care provider that can promote health and wellness. What does preventive care include?  A yearly physical exam. This is also called an annual well check.  Dental exams once or twice a year.  Routine eye exams. Ask your health care provider how often you should have your eyes checked.  Personal lifestyle choices, including:  Daily care of your teeth and gums.  Regular physical activity.  Eating a healthy diet.  Avoiding tobacco and drug use.  Limiting alcohol use.  Practicing safe sex.  Taking low-dose aspirin every day.  Taking vitamin and mineral supplements as recommended by your health care provider. What happens during an annual well check? The services and screenings done by your health care provider during your annual well check will depend on your age, overall health, lifestyle risk factors, and family history of disease. Counseling  Your health care provider may ask you questions about your:  Alcohol use.  Tobacco use.  Drug use.  Emotional well-being.  Home and  relationship well-being.  Sexual activity.  Eating habits.  History of falls.  Memory and ability to understand (cognition).  Work and work Statistician.  Reproductive health. Screening  You may have the following tests or measurements:  Height, weight, and BMI.  Blood pressure.  Lipid and cholesterol levels. These may be checked every 5 years, or more frequently if you are over 72 years old.  Skin check.  Lung cancer screening. You may have this screening every year starting at age 25 if you have a 30-pack-year history of smoking and currently smoke or have quit within the past 15 years.  Fecal occult blood test (FOBT) of the stool. You may have this test every year starting at age 72.  Flexible sigmoidoscopy or colonoscopy. You may have a sigmoidoscopy every 5 years or a colonoscopy every 10 years starting at age 72.  Hepatitis C blood test.  Hepatitis B blood test.  Sexually transmitted disease (STD) testing.  Diabetes screening. This is done by checking your blood sugar (glucose) after you have not eaten for a while (fasting). You may have this done every 1-3 years.  Bone density scan. This is done to screen for osteoporosis. You may have this done starting at age 60.  Mammogram. This may be done every 1-2 years. Talk to your health care provider about how often you should have regular mammograms. Talk with your health care provider about your test results, treatment options, and if necessary, the need for more tests. Vaccines  Your health care provider may recommend certain vaccines, such as:  Influenza vaccine. This is recommended every year.  Tetanus, diphtheria, and acellular pertussis (Tdap, Td) vaccine. You may need a Td booster every 10 years.  Zoster vaccine. You may need this after age 72.  Pneumococcal 13-valent conjugate (PCV13) vaccine. One dose is recommended after age 72.  Pneumococcal polysaccharide (PPSV23) vaccine. One dose is recommended after  age 40. Talk to your health care provider about which screenings and vaccines you need and how often you need them. This information is not intended to replace advice given to you by your health care provider. Make sure you discuss any questions you have with your health care provider. Document Released: 01/27/2015 Document Revised: 09/20/2015 Document Reviewed: 11/01/2014 Elsevier Interactive Patient Education  2017 [place] Prevention in the Home Falls can cause injuries. They can happen to people of all ages. There are many things you can do to make your home safe and to help prevent falls. What can I do on the outside of my home?  Regularly fix the edges of walkways and driveways and fix any cracks.  Remove anything that might make you trip as you walk through a door, such as a raised step or threshold.  Trim any bushes or trees on the path to your home.  Use bright outdoor lighting.  Clear any walking paths of anything that might make someone trip, such as rocks or tools.  Regularly check to see if handrails are loose or broken. Make sure that both sides of any steps have handrails.  Any raised decks and porches should have guardrails on the edges.  Have any leaves, snow, or ice cleared regularly.  Use sand or salt on walking paths during winter.  Clean up any spills in your garage right away. This includes oil or grease spills. What can I do in the bathroom?  Use night lights.  Install grab bars by the toilet and in the tub and shower. Do not use towel bars as grab bars.  Use non-skid mats or decals in the tub or shower.  If you need to sit down in the shower, use a plastic, non-slip stool.  Keep the floor dry. Clean up any water that spills on the floor as soon as it happens.  Remove soap buildup in the tub or shower regularly.  Attach bath mats securely with double-sided non-slip rug tape.  Do not have throw rugs and other things on the floor that can  make you trip. What can I do in the bedroom?  Use night lights.  Make sure that you have a light by your bed that is easy to reach.  Do not use any sheets or blankets that are too big for your bed. They should not hang down onto the floor.  Have a firm chair that has side arms. You can use this for support while you get dressed.  Do not have throw rugs and other things on the floor that can make you trip. What can I do in the kitchen?  Clean up any spills right away.  Avoid walking on wet floors.  Keep items that you use a lot in easy-to-reach places.  If you need to reach something above you, use a strong step stool that has a grab bar.  Keep electrical cords out of the way.  Do not use floor polish or wax that makes floors slippery. If you must use wax, use non-skid floor wax.  Do not have throw rugs and other things on the floor that can make you trip. What can I do with my stairs?  Do not leave any items on the stairs.  Make sure that there are  handrails on both sides of the stairs and use them. Fix handrails that are broken or loose. Make sure that handrails are as long as the stairways.  Check any carpeting to make sure that it is firmly attached to the stairs. Fix any carpet that is loose or worn.  Avoid having throw rugs at the top or bottom of the stairs. If you do have throw rugs, attach them to the floor with carpet tape.  Make sure that you have a light switch at the top of the stairs and the bottom of the stairs. If you do not have them, ask someone to add them for you. What else can I do to help prevent falls?  Wear shoes that:  Do not have high heels.  Have rubber bottoms.  Are comfortable and fit you well.  Are closed at the toe. Do not wear sandals.  If you use a stepladder:  Make sure that it is fully opened. Do not climb a closed stepladder.  Make sure that both sides of the stepladder are locked into place.  Ask someone to hold it for you,  if possible.  Clearly mark and make sure that you can see:  Any grab bars or handrails.  First and last steps.  Where the edge of each step is.  Use tools that help you move around (mobility aids) if they are needed. These include:  Canes.  Walkers.  Scooters.  Crutches.  Turn on the lights when you go into a dark area. Replace any light bulbs as soon as they burn out.  Set up your furniture so you have a clear path. Avoid moving your furniture around.  If any of your floors are uneven, fix them.  If there are any pets around you, be aware of where they are.  Review your medicines with your doctor. Some medicines can make you feel dizzy. This can increase your chance of falling. Ask your doctor what other things that you can do to help prevent falls. This information is not intended to replace advice given to you by your health care provider. Make sure you discuss any questions you have with your health care provider. Document Released: 10/27/2008 Document Revised: 06/08/2015 Document Reviewed: 02/04/2014 Elsevier Interactive Patient Education  2017 [employer].

## 2019-05-19 NOTE — Patient Instructions (Signed)
Health Maintenance, Female Adopting a healthy lifestyle and getting preventive care are important in promoting health and wellness. Ask your health care provider about:  The right schedule for you to have regular tests and exams.  Things you can do on your own to prevent diseases and keep yourself healthy. What should I know about diet, weight, and exercise? Eat a healthy diet   Eat a diet that includes plenty of vegetables, fruits, low-fat dairy products, and lean protein.  Do not eat a lot of foods that are high in solid fats, added sugars, or sodium. Maintain a healthy weight Body mass index (BMI) is used to identify weight problems. It estimates body fat based on height and weight. Your health care provider can help determine your BMI and help you achieve or maintain a healthy weight. Get regular exercise Get regular exercise. This is one of the most important things you can do for your health. Most adults should:  Exercise for at least 150 minutes each week. The exercise should increase your heart rate and make you sweat (moderate-intensity exercise).  Do strengthening exercises at least twice a week. This is in addition to the moderate-intensity exercise.  Spend less time sitting. Even light physical activity can be beneficial. Watch cholesterol and blood lipids Have your blood tested for lipids and cholesterol at 72 years of age, then have this test every 5 years. Have your cholesterol levels checked more often if:  Your lipid or cholesterol levels are high.  You are older than 72 years of age.  You are at high risk for heart disease. What should I know about cancer screening? Depending on your health history and family history, you may need to have cancer screening at various ages. This may include screening for:  Breast cancer.  Cervical cancer.  Colorectal cancer.  Skin cancer.  Lung cancer. What should I know about heart disease, diabetes, and high blood  pressure? Blood pressure and heart disease  High blood pressure causes heart disease and increases the risk of stroke. This is more likely to develop in people who have high blood pressure readings, are of African descent, or are overweight.  Have your blood pressure checked: ? Every 3-5 years if you are 18-39 years of age. ? Every year if you are 40 years old or older. Diabetes Have regular diabetes screenings. This checks your fasting blood sugar level. Have the screening done:  Once every three years after age 40 if you are at a normal weight and have a low risk for diabetes.  More often and at a younger age if you are overweight or have a high risk for diabetes. What should I know about preventing infection? Hepatitis B If you have a higher risk for hepatitis B, you should be screened for this virus. Talk with your health care provider to find out if you are at risk for hepatitis B infection. Hepatitis C Testing is recommended for:  Everyone born from 1945 through 1965.  Anyone with known risk factors for hepatitis C. Sexually transmitted infections (STIs)  Get screened for STIs, including gonorrhea and chlamydia, if: ? You are sexually active and are younger than 72 years of age. ? You are older than 72 years of age and your health care provider tells you that you are at risk for this type of infection. ? Your sexual activity has changed since you were last screened, and you are at increased risk for chlamydia or gonorrhea. Ask your health care provider if   you are at risk.  Ask your health care provider about whether you are at high risk for HIV. Your health care provider may recommend a prescription medicine to help prevent HIV infection. If you choose to take medicine to prevent HIV, you should first get tested for HIV. You should then be tested every 3 months for as long as you are taking the medicine. Pregnancy  If you are about to stop having your period (premenopausal) and  you may become pregnant, seek counseling before you get pregnant.  Take 400 to 800 micrograms (mcg) of folic acid every day if you become pregnant.  Ask for birth control (contraception) if you want to prevent pregnancy. Osteoporosis and menopause Osteoporosis is a disease in which the bones lose minerals and strength with aging. This can result in bone fractures. If you are 65 years old or older, or if you are at risk for osteoporosis and fractures, ask your health care provider if you should:  Be screened for bone loss.  Take a calcium or vitamin D supplement to lower your risk of fractures.  Be given hormone replacement therapy (HRT) to treat symptoms of menopause. Follow these instructions at home: Lifestyle  Do not use any products that contain nicotine or tobacco, such as cigarettes, e-cigarettes, and chewing tobacco. If you need help quitting, ask your health care provider.  Do not use street drugs.  Do not share needles.  Ask your health care provider for help if you need support or information about quitting drugs. Alcohol use  Do not drink alcohol if: ? Your health care provider tells you not to drink. ? You are pregnant, may be pregnant, or are planning to become pregnant.  If you drink alcohol: ? Limit how much you use to 0-1 drink a day. ? Limit intake if you are breastfeeding.  Be aware of how much alcohol is in your drink. In the [place] one drink equals one 12 oz bottle of beer (355 mL), one 5 oz glass of wine (148 mL), or one 1 oz glass of hard liquor (44 mL). General instructions  Schedule regular health, dental, and eye exams.  Stay current with your vaccines.  Tell your health care provider if: ? You often feel depressed. ? You have ever been abused or do not feel safe at home. Summary  Adopting a healthy lifestyle and getting preventive care are important in promoting health and wellness.  Follow your health care provider's instructions about healthy  diet, exercising, and getting tested or screened for diseases.  Follow your health care provider's instructions on monitoring your cholesterol and blood pressure. This information is not intended to replace advice given to you by your health care provider. Make sure you discuss any questions you have with your health care provider. Document Revised: 12/24/2017 Document Reviewed: 12/24/2017 Elsevier Patient Education  2020 Elsevier Inc.  

## 2019-05-20 LAB — CMP14+EGFR
ALT: 18 IU/L (ref 0–32)
AST: 21 IU/L (ref 0–40)
Albumin/Globulin Ratio: 1.7 (ref 1.2–2.2)
Albumin: 4.3 g/dL (ref 3.7–4.7)
Alkaline Phosphatase: 93 IU/L (ref 39–117)
BUN/Creatinine Ratio: 21 (ref 12–28)
BUN: 17 mg/dL (ref 8–27)
Bilirubin Total: 0.4 mg/dL (ref 0.0–1.2)
CO2: 26 mmol/L (ref 20–29)
Calcium: 9.5 mg/dL (ref 8.7–10.3)
Chloride: 103 mmol/L (ref 96–106)
Creatinine, Ser: 0.81 mg/dL (ref 0.57–1.00)
GFR calc Af Amer: 84 mL/min/{1.73_m2} (ref >59)
GFR calc non Af Amer: 73 mL/min/{1.73_m2} (ref >59)
Globulin, Total: 2.6 g/dL (ref 1.5–4.5)
Glucose: 90 mg/dL (ref 65–99)
Potassium: 4.2 mmol/L (ref 3.5–5.2)
Sodium: 141 mmol/L (ref 134–144)
Total Protein: 6.9 g/dL (ref 6.0–8.5)

## 2019-05-20 LAB — LIPID PANEL
Chol/HDL Ratio: 3.3 ratio (ref 0.0–4.4)
Cholesterol, Total: 165 mg/dL (ref 100–199)
HDL: 50 mg/dL (ref >39)
LDL Chol Calc (NIH): 97 mg/dL (ref 0–99)
Triglycerides: 99 mg/dL (ref 0–149)
VLDL Cholesterol Cal: 18 mg/dL (ref 5–40)

## 2019-05-20 LAB — CBC
Hematocrit: 42.4 % (ref 34.0–46.6)
Hemoglobin: 13.7 g/dL (ref 11.1–15.9)
MCH: 25.6 pg — ABNORMAL LOW (ref 26.6–33.0)
MCHC: 32.3 g/dL (ref 31.5–35.7)
MCV: 79 fL (ref 79–97)
Platelets: 253 10*3/uL (ref 150–450)
RBC: 5.36 x10E6/uL — ABNORMAL HIGH (ref 3.77–5.28)
RDW: 17.8 % — ABNORMAL HIGH (ref 11.7–15.4)
WBC: 7.9 10*3/uL (ref 3.4–10.8)

## 2019-05-20 LAB — HEMOGLOBIN A1C
Est. average glucose Bld gHb Est-mCnc: 123 mg/dL
Hgb A1c MFr Bld: 5.9 % — ABNORMAL HIGH (ref 4.8–5.6)

## 2019-05-20 LAB — CK: Total CK: 105 U/L (ref 32–182)

## 2019-05-20 LAB — T4, FREE: Free T4: 1.52 ng/dL (ref 0.82–1.77)

## 2019-05-20 LAB — TSH: TSH: 0.825 u[IU]/mL (ref 0.450–4.500)

## 2019-05-22 ENCOUNTER — Encounter: Payer: Self-pay | Admitting: Internal Medicine

## 2019-05-26 ENCOUNTER — Encounter: Payer: Medicare Other | Admitting: Internal Medicine

## 2019-05-26 ENCOUNTER — Ambulatory Visit: Payer: Medicare Other

## 2019-06-03 ENCOUNTER — Encounter: Payer: Self-pay | Admitting: Internal Medicine

## 2019-06-03 ENCOUNTER — Other Ambulatory Visit: Payer: Self-pay | Admitting: Internal Medicine

## 2019-06-03 DIAGNOSIS — H2513 Age-related nuclear cataract, bilateral: Secondary | ICD-10-CM | POA: Diagnosis not present

## 2019-06-03 DIAGNOSIS — D3132 Benign neoplasm of left choroid: Secondary | ICD-10-CM | POA: Diagnosis not present

## 2019-06-03 DIAGNOSIS — H04123 Dry eye syndrome of bilateral lacrimal glands: Secondary | ICD-10-CM | POA: Diagnosis not present

## 2019-06-03 DIAGNOSIS — M791 Myalgia, unspecified site: Secondary | ICD-10-CM

## 2019-06-03 DIAGNOSIS — I700 Atherosclerosis of aorta: Secondary | ICD-10-CM

## 2019-06-03 NOTE — Progress Notes (Signed)
Reff

## 2019-06-04 ENCOUNTER — Encounter: Payer: Self-pay | Admitting: Internal Medicine

## 2019-06-24 ENCOUNTER — Other Ambulatory Visit: Payer: Self-pay | Admitting: Internal Medicine

## 2019-07-02 ENCOUNTER — Ambulatory Visit: Payer: Medicare Other | Admitting: Cardiology

## 2019-07-02 ENCOUNTER — Other Ambulatory Visit: Payer: Self-pay

## 2019-07-02 ENCOUNTER — Encounter: Payer: Self-pay | Admitting: Cardiology

## 2019-07-02 VITALS — BP 130/90 | HR 83 | Resp 16 | Ht 63.0 in | Wt 201.2 lb

## 2019-07-02 DIAGNOSIS — E7800 Pure hypercholesterolemia, unspecified: Secondary | ICD-10-CM | POA: Diagnosis not present

## 2019-07-02 DIAGNOSIS — E6609 Other obesity due to excess calories: Secondary | ICD-10-CM | POA: Diagnosis not present

## 2019-07-02 DIAGNOSIS — I700 Atherosclerosis of aorta: Secondary | ICD-10-CM

## 2019-07-02 DIAGNOSIS — R739 Hyperglycemia, unspecified: Secondary | ICD-10-CM | POA: Diagnosis not present

## 2019-07-02 DIAGNOSIS — R030 Elevated blood-pressure reading, without diagnosis of hypertension: Secondary | ICD-10-CM | POA: Diagnosis not present

## 2019-07-02 DIAGNOSIS — Z6835 Body mass index (BMI) 35.0-35.9, adult: Secondary | ICD-10-CM | POA: Diagnosis not present

## 2019-07-02 NOTE — Progress Notes (Signed)
Primary Physician/Referring:  Glendale Chard, MD  Patient ID: Joanne Reese, female    DOB: Apr 24, 1945, 74 y.o.   MRN: [id number]  Chief Complaint  Patient presents with  . Artheriosclerosis  . New Patient (Initial Visit)    Referred by Dr. Glendale Chard   HPI:    Joanne Reese  is a 74 y.o. presents for atherosclerosis that was seen incidentally on CT scan. Patient is asymptomatic. She requested to be seen by a cardiologist due to concerns over the CT findings. Regularly she walked about 45 minutes at a moderate pace every other day but has not started again since contracting COVID about 2 months ago. She feels fully recovered from this and has received both vaccines.  Jan 2019- R knee surgery without peri procedural complications.  She is very sedentary but asymptomatic.   Past Medical History:  Diagnosis Date  . Arthritis   . Depression   . GERD (gastroesophageal reflux disease)    occ  . Hypothyroidism    Past Surgical History:  Procedure Laterality Date  . CYST EXCISION     ovary  30+ yrs  . PARTIAL KNEE ARTHROPLASTY Right 02/10/2017   Procedure: RIGHT UNICOMPARTMENTAL KNEE;  Surgeon: Vickey Huger, MD;  Location: [facility];  Service: Orthopedics;  Laterality: Right;  . THYROID LOBECTOMY     Family History  Problem Relation Age of Onset  . Emphysema Father     Social History   Tobacco Use  . Smoking status: Never Smoker  . Smokeless tobacco: Never Used  Substance Use Topics  . Alcohol use: Not Currently    Comment: occasional  never smoker or tobacco use.  Does not work.  Marital Status: Widowed  ROS  Review of Systems  Cardiovascular: Negative for chest pain, dyspnea on exertion and leg swelling.  Gastrointestinal: Negative for melena.   Objective  Blood pressure 130/90, pulse 83, resp. rate 16, height 5\' 3"  (1.6 m), weight 201 lb 3.2 oz (91.3 kg), SpO2 97 %.  Vitals with BMI 07/02/2019 07/02/2019 05/19/2019  Height - 5\' 3"  5\' 3"   Weight - 201 lbs 3  oz 195 lbs  BMI - 54.65 68.12  Systolic 751 700 174  Diastolic 90 97 86  Pulse - 83 83     Physical Exam Constitutional:      Comments: Moderately obese in no acute distress  Cardiovascular:     Rate and Rhythm: Normal rate and regular rhythm.     Pulses: Intact distal pulses.     Heart sounds: Normal heart sounds. No murmur heard.  No gallop.      Comments: No leg edema, no JVD. Pulmonary:     Effort: Pulmonary effort is normal.     Breath sounds: Normal breath sounds.  Abdominal:     General: Bowel sounds are normal.     Palpations: Abdomen is soft.    Laboratory examination:   Recent Labs    11/19/18 1146 05/19/19 1528  NA 141 141  K 4.6 4.2  CL 105 103  CO2 29 26  GLUCOSE 101* 90  BUN 9 17  CREATININE 0.87 0.81  CALCIUM 9.5 9.5  GFRNONAA  --  73  GFRAA  --  84   CrCl cannot be calculated (Patient's most recent lab result is older than the maximum 21 days allowed.).  CMP Latest Ref Rng & Units 05/19/2019 11/19/2018 04/30/2018  Glucose 65 - 99 mg/dL 90 101(H) 111(H)  BUN 8 - 27 mg/dL 17 9 9  Creatinine 0.57 - 1.00 mg/dL 0.81 0.87 0.86  Sodium 134 - 144 mmol/L 141 141 147(H)  Potassium 3.5 - 5.2 mmol/L 4.2 4.6 4.8  Chloride 96 - 106 mmol/L 103 105 107(H)  CO2 20 - 29 mmol/L 26 29 23   Calcium 8.7 - 10.3 mg/dL 9.5 9.5 8.8  Total Protein 6.0 - 8.5 g/dL 6.9 - 6.9  Total Bilirubin 0.0 - 1.2 mg/dL 0.4 - 0.4  Alkaline Phos 39 - 117 IU/L 93 - 108  AST 0 - 40 IU/L 21 - 19  ALT 0 - 32 IU/L 18 - 20   CBC Latest Ref Rng & Units 05/19/2019 11/19/2018 01/28/2017  WBC 3.4 - 10.8 x10E3/uL 7.9 6.7 6.7  Hemoglobin 11.1 - 15.9 g/dL 13.7 12.8 10.6(L)  Hematocrit 34.0 - 46.6 % 42.4 40.6 35.2(L)  Platelets 150 - 450 x10E3/uL 253 247.0 250   Lipid Panel     Component Value Date/Time   CHOL 165 05/19/2019 1528   TRIG 99 05/19/2019 1528   HDL 50 05/19/2019 1528   CHOLHDL 3.3 05/19/2019 1528   LDLCALC 97 05/19/2019 1528   LABVLDL 18 05/19/2019 1528  HEMOGLOBIN A1C Lab Results    Component Value Date   HGBA1C 5.9 (H) 05/19/2019   TSH Recent Labs    11/19/18 1146 05/19/19 1528  TSH 0.84 0.825   Medications and allergies   Allergies  Allergen Reactions  . Nitrofurantoin Anaphylaxis  . Pneumococcal Vaccines Other (See Comments)    LOCAL REACTION Redness & Pain around injection site  Pneumoxvac 23 (active) per patient report     Current Outpatient Medications  Medication Instructions  . acyclovir (ZOVIRAX) 400 mg, Oral, As needed, 1 tablet BID as needed.  Marland Kitchen acyclovir cream (ZOVIRAX) 5 % 1 application, Topical, As needed  . aspirin EC 81 mg, Oral, Daily, Swallow whole.   . Biotin 10000 MCG TABS Oral, daily   . Cholecalciferol (VITAMIN D3) 50 MCG (2000 UT) capsule 1 capsule, Oral, Daily  . ELDERBERRY PO 1,250 mg PE, Oral  . levothyroxine (SYNTHROID) 112 mcg, Oral, Daily before breakfast  . simvastatin (ZOCOR) 20 MG tablet TAKE 1 TABLET BY MOUTH IN THE EVENING  . Synthroid 112 mcg, Oral, Daily before breakfast    There are no discontinued medications.  Radiology:   CT scan of the chest without contrast 05/17/2019:  Atherosclerotic aortic arch. Pleuroparenchymal scarring unchanged from 11/20/2018, mosaic continuation in the lungs, improvement in bandlike opacities compared to previous.   Thoracic kyphosis and spondylosis  Cardiac Studies:   EKG  EKG 07/02/2019: Normal sinus rhythm at rate of 80 bpm, normal axis, poor R wave progression, cannot exclude anteroseptal infarct old.  No evidence of ischemia.  Otherwise normal EKG.     Assessment     ICD-10-CM   1. Aortic atherosclerosis (HCC)  I70.0 EKG 12-Lead  2. Hypercholesteremia  E78.00   3. Class 2 obesity due to excess calories without serious comorbidity with body mass index (BMI) of 35.0 to 35.9 in adult  E66.09    Z68.35   4. Pre-hypertension  R03.0   5. Hyperglycemia  R73.9     Recommendations:   Joanne Reese  is a 74 y.o. presents for atherosclerosis that was seen incidentally  on CT scan. Patient is asymptomatic.  Her examination except for moderate obesity is normal without any heart murmur or gallop.  Her atherosclerotic risk factor including hyperlipidemia is well treated by her PCP.  Her blood pressure was elevated today although patient states that her  blood pressure has been very well controlled without any medications.    She does not wish to be on any medicines for now, I have discussed with her various ways of weight loss programs, weight watchers was also discussed with the patient. Weight loss stressed again and gave positive reinforcement. [facility] diet (low glycemic) sheet given to the patient.  Discussed regarding DASH diet and salt restriction.  Weight loss will also certainly help to bring him down serum glucose levels back to normal.  She appears to be motivated. I discussed the clinical findings and available test results with the patient and have reassured the patient. Unless recurrent symptoms or worsening or new symptoms, I will see the patient on a PRN basis. Patient is pleased with the evaluation.   I reviewed her external labs, she does have hyperglycemia as well as cardiovascular risk, but as she remains asymptomatic and states that she walks at least 30 minutes a day except for the last 2 months since COVID-19 infection, I would not recommend any stress testing or cardiac testing.  I will see her back on a as needed basis.   Adrian Prows, MD, [facility] 07/04/2019, 4:57 PM [facility] [place] Office: [phone]

## 2019-07-22 ENCOUNTER — Encounter: Payer: Self-pay | Admitting: Internal Medicine

## 2019-07-26 ENCOUNTER — Ambulatory Visit (INDEPENDENT_AMBULATORY_CARE_PROVIDER_SITE_OTHER): Payer: Medicare Other | Admitting: Internal Medicine

## 2019-07-26 ENCOUNTER — Encounter: Payer: Self-pay | Admitting: Internal Medicine

## 2019-07-26 ENCOUNTER — Other Ambulatory Visit: Payer: Self-pay

## 2019-07-26 VITALS — BP 136/84 | HR 79 | Temp 98.1°F | Ht 63.0 in | Wt 195.4 lb

## 2019-07-26 DIAGNOSIS — R030 Elevated blood-pressure reading, without diagnosis of hypertension: Secondary | ICD-10-CM

## 2019-07-26 NOTE — Patient Instructions (Signed)
Start taking magnesium with evening meal  DASH Eating Plan DASH stands for "Dietary Approaches to Stop Hypertension." The DASH eating plan is a healthy eating plan that has been shown to reduce high blood pressure (hypertension). It may also reduce your risk for type 2 diabetes, heart disease, and stroke. The DASH eating plan may also help with weight loss. What are tips for following this plan?  General guidelines  Avoid eating more than 2,300 mg (milligrams) of salt (sodium) a day. If you have hypertension, you may need to reduce your sodium intake to 1,500 mg a day.  Limit alcohol intake to no more than 1 drink a day for nonpregnant women and 2 drinks a day for men. One drink equals 12 oz of beer, 5 oz of wine, or 1 oz of hard liquor.  Work with your health care provider to maintain a healthy body weight or to lose weight. Ask what an ideal weight is for you.  Get at least 30 minutes of exercise that causes your heart to beat faster (aerobic exercise) most days of the week. Activities may include walking, swimming, or biking.  Work with your health care provider or diet and nutrition specialist (dietitian) to adjust your eating plan to your individual calorie needs. Reading food labels   Check food labels for the amount of sodium per serving. Choose foods with less than 5 percent of the Daily Value of sodium. Generally, foods with less than 300 mg of sodium per serving fit into this eating plan.  To find whole grains, look for the word "whole" as the first word in the ingredient list. Shopping  Buy products labeled as "low-sodium" or "no salt added."  Buy fresh foods. Avoid canned foods and premade or frozen meals. Cooking  Avoid adding salt when cooking. Use salt-free seasonings or herbs instead of table salt or sea salt. Check with your health care provider or pharmacist before using salt substitutes.  Do not fry foods. Cook foods using healthy methods such as baking, boiling,  grilling, and broiling instead.  Cook with heart-healthy oils, such as olive, canola, soybean, or sunflower oil. Meal planning  Eat a balanced diet that includes: ? 5 or more servings of fruits and vegetables each day. At each meal, try to fill half of your plate with fruits and vegetables. ? Up to 6-8 servings of whole grains each day. ? Less than 6 oz of lean meat, poultry, or fish each day. A 3-oz serving of meat is about the same size as a deck of cards. One egg equals 1 oz. ? 2 servings of low-fat dairy each day. ? A serving of nuts, seeds, or beans 5 times each week. ? Heart-healthy fats. Healthy fats called Omega-3 fatty acids are found in foods such as flaxseeds and coldwater fish, like sardines, salmon, and mackerel.  Limit how much you eat of the following: ? Canned or prepackaged foods. ? Food that is high in trans fat, such as fried foods. ? Food that is high in saturated fat, such as fatty meat. ? Sweets, desserts, sugary drinks, and other foods with added sugar. ? Full-fat dairy products.  Do not salt foods before eating.  Try to eat at least 2 vegetarian meals each week.  Eat more home-cooked food and less restaurant, buffet, and fast food.  When eating at a restaurant, ask that your food be prepared with less salt or no salt, if possible. What foods are recommended? The items listed may not be a  complete list. Talk with your dietitian about what dietary choices are best for you. Grains Whole-grain or whole-wheat bread. Whole-grain or whole-wheat pasta. Brown rice. Modena Morrow. Bulgur. Whole-grain and low-sodium cereals. Pita bread. Low-fat, low-sodium crackers. Whole-wheat flour tortillas. Vegetables Fresh or frozen vegetables (raw, steamed, roasted, or grilled). Low-sodium or reduced-sodium tomato and vegetable juice. Low-sodium or reduced-sodium tomato sauce and tomato paste. Low-sodium or reduced-sodium canned vegetables. Fruits All fresh, dried, or frozen  fruit. Canned fruit in natural juice (without added sugar). Meat and other protein foods Skinless chicken or [place]. Ground chicken or [place]. Pork with fat trimmed off. Fish and seafood. Egg whites. Dried beans, peas, or lentils. Unsalted nuts, nut butters, and seeds. Unsalted canned beans. Lean cuts of beef with fat trimmed off. Low-sodium, lean deli meat. Dairy Low-fat (1%) or fat-free (skim) milk. Fat-free, low-fat, or reduced-fat cheeses. Nonfat, low-sodium ricotta or cottage cheese. Low-fat or nonfat yogurt. Low-fat, low-sodium cheese. Fats and oils Soft margarine without trans fats. Vegetable oil. Low-fat, reduced-fat, or light mayonnaise and salad dressings (reduced-sodium). Canola, safflower, olive, soybean, and sunflower oils. Avocado. Seasoning and other foods Herbs. Spices. Seasoning mixes without salt. Unsalted popcorn and pretzels. Fat-free sweets. What foods are not recommended? The items listed may not be a complete list. Talk with your dietitian about what dietary choices are best for you. Grains Baked goods made with fat, such as croissants, muffins, or some breads. Dry pasta or rice meal packs. Vegetables Creamed or fried vegetables. Vegetables in a cheese sauce. Regular canned vegetables (not low-sodium or reduced-sodium). Regular canned tomato sauce and paste (not low-sodium or reduced-sodium). Regular tomato and vegetable juice (not low-sodium or reduced-sodium). Angie Fava. Olives. Fruits Canned fruit in a light or heavy syrup. Fried fruit. Fruit in cream or butter sauce. Meat and other protein foods Fatty cuts of meat. Ribs. Fried meat. Berniece Salines. Sausage. Bologna and other processed lunch meats. Salami. Fatback. Hotdogs. Bratwurst. Salted nuts and seeds. Canned beans with added salt. Canned or smoked fish. Whole eggs or egg yolks. Chicken or [place] with skin. Dairy Whole or 2% milk, cream, and half-and-half. Whole or full-fat cream cheese. Whole-fat or sweetened yogurt. Full-fat  cheese. Nondairy creamers. Whipped toppings. Processed cheese and cheese spreads. Fats and oils Butter. Stick margarine. Lard. Shortening. Ghee. Bacon fat. Tropical oils, such as coconut, palm kernel, or palm oil. Seasoning and other foods Salted popcorn and pretzels. Onion salt, garlic salt, seasoned salt, table salt, and sea salt. Worcestershire sauce. Tartar sauce. Barbecue sauce. Teriyaki sauce. Soy sauce, including reduced-sodium. Steak sauce. Canned and packaged gravies. Fish sauce. Oyster sauce. Cocktail sauce. Horseradish that you find on the shelf. Ketchup. Mustard. Meat flavorings and tenderizers. Bouillon cubes. Hot sauce and Tabasco sauce. Premade or packaged marinades. Premade or packaged taco seasonings. Relishes. Regular salad dressings. Where to find more information:  National Heart, Lung, and [place]: https://wilson-eaton.com/  American Heart Association: www.heart.org Summary  The DASH eating plan is a healthy eating plan that has been shown to reduce high blood pressure (hypertension). It may also reduce your risk for type 2 diabetes, heart disease, and stroke.  With the DASH eating plan, you should limit salt (sodium) intake to 2,300 mg a day. If you have hypertension, you may need to reduce your sodium intake to 1,500 mg a day.  When on the DASH eating plan, aim to eat more fresh fruits and vegetables, whole grains, lean proteins, low-fat dairy, and heart-healthy fats.  Work with your health care provider or diet and nutrition specialist (dietitian)  to adjust your eating plan to your individual calorie needs. This information is not intended to replace advice given to you by your health care provider. Make sure you discuss any questions you have with your health care provider. Document Revised: 12/13/2016 Document Reviewed: 12/25/2015 Elsevier Patient Education  2020 [employer].

## 2019-07-26 NOTE — Progress Notes (Signed)
This visit occurred during the SARS-CoV-2 public health emergency.  Safety protocols were in place, including screening questions prior to the visit, additional usage of staff PPE, and extensive cleaning of exam room while observing appropriate contact time as indicated for disinfecting solutions.  Subjective:     Patient ID: Joanne Reese , female    DOB: 1945/03/29 , 74 y.o.   MRN: [id number]   Chief Complaint  Patient presents with  . Hypertension    HPI  She is here today for bp check. She is concerned b/c she has been checking her BP readings at home. She has been getting readings that range from 150s/80s - 170s/90s. She is not sure if she is using her machine correctly. She unfortunately did not bring in her machine today.     Past Medical History:  Diagnosis Date  . Arthritis   . Depression   . GERD (gastroesophageal reflux disease)    occ  . Hypothyroidism      Family History  Problem Relation Age of Onset  . Emphysema Father      Current Outpatient Medications:  .  acyclovir (ZOVIRAX) 400 MG tablet, Take 400 mg by mouth as needed (for outbreaks). 1 tablet BID as needed., Disp: , Rfl:  .  acyclovir cream (ZOVIRAX) 5 %, Apply 1 application topically as needed., Disp: , Rfl:  .  aspirin EC 81 MG tablet, Take 81 mg by mouth daily. Swallow whole., Disp: , Rfl:  .  Biotin 10000 MCG TABS, Take by mouth. daily, Disp: , Rfl:  .  Cholecalciferol (VITAMIN D3) 50 MCG (2000 UT) capsule, Take 1 capsule by mouth daily. , Disp: , Rfl:  .  levothyroxine (SYNTHROID) 112 MCG tablet, Take 112 mcg by mouth daily before breakfast., Disp: , Rfl:  .  simvastatin (ZOCOR) 20 MG tablet, TAKE 1 TABLET BY MOUTH IN THE EVENING, Disp: 90 tablet, Rfl: 2 .  ELDERBERRY PO, Take 1,250 mg PE by mouth.  (Patient not taking: Reported on 07/26/2019), Disp: , Rfl:    Allergies  Allergen Reactions  . Nitrofurantoin Anaphylaxis  . Pneumococcal Vaccines Other (See Comments)    LOCAL  REACTION Redness & Pain around injection site  Pneumoxvac 23 (active) per patient report     Review of Systems  Constitutional: Negative.   Respiratory: Negative.   Cardiovascular: Negative.   Gastrointestinal: Negative.   Neurological: Negative.   Psychiatric/Behavioral: Negative.      Today's Vitals   07/26/19 1124  BP: 136/84  Pulse: 79  Temp: 98.1 F (36.7 C)  TempSrc: Oral  Weight: 195 lb 6.4 oz (88.6 kg)  Height: 5\' 3"  (1.6 m)  PainSc: 0-No pain   Body mass index is 34.61 kg/m.   Objective:  Physical Exam Vitals and nursing note reviewed.  Constitutional:      Appearance: Normal appearance.  HENT:     Head: Normocephalic and atraumatic.  Cardiovascular:     Rate and Rhythm: Normal rate and regular rhythm.     Heart sounds: Normal heart sounds.  Pulmonary:     Effort: Pulmonary effort is normal.     Breath sounds: Normal breath sounds.  Skin:    General: Skin is warm.  Neurological:     General: No focal deficit present.     Mental Status: She is alert.  Psychiatric:        Mood and Affect: Mood normal.        Behavior: Behavior normal.  Assessment And Plan:     1. Elevated blood pressure reading  Pt advised that I do plan on starting her on medication. However, I want to know how accurate her machine is. She agrees to go to her local fire station to have her BP checked tomorrow. She is advised to take her home BP cuff as well, so both readings can be compared. She is advised to avoid adding salt to her foods. She was also given information regarding a DASH diet. Her recent Cardiology note was reviewed as well. Importance of following a heart healthy diet was stressed to the patient. She agrees to return to office in six weeks for re-evaluation. She verbally understands that a medication will not be called in until after her BP reading at the fire station. All questions were answered to her satisfaction.    Michelle Nasuti, [place], Michelle Nasuti, CMA, have reviewed all documentation for this visit. The documentation on 07/26/19 for the exam, diagnosis, procedures, and orders are all accurate and complete.  THE PATIENT IS ENCOURAGED TO PRACTICE SOCIAL DISTANCING DUE TO THE COVID-19 PANDEMIC.

## 2019-07-27 ENCOUNTER — Other Ambulatory Visit: Payer: Self-pay | Admitting: Internal Medicine

## 2019-07-27 ENCOUNTER — Encounter: Payer: Self-pay | Admitting: Internal Medicine

## 2019-07-27 MED ORDER — AMLODIPINE BESYLATE 2.5 MG PO TABS
2.5000 mg | ORAL_TABLET | Freq: Every day | ORAL | 1 refills | Status: DC
Start: 2019-07-27 — End: 2019-08-19

## 2019-07-29 ENCOUNTER — Ambulatory Visit (INDEPENDENT_AMBULATORY_CARE_PROVIDER_SITE_OTHER): Payer: Medicare Other | Admitting: Internal Medicine

## 2019-07-29 ENCOUNTER — Encounter: Payer: Self-pay | Admitting: Internal Medicine

## 2019-07-29 ENCOUNTER — Other Ambulatory Visit: Payer: Self-pay

## 2019-07-29 DIAGNOSIS — R0600 Dyspnea, unspecified: Secondary | ICD-10-CM | POA: Diagnosis not present

## 2019-07-29 DIAGNOSIS — R911 Solitary pulmonary nodule: Secondary | ICD-10-CM

## 2019-07-29 DIAGNOSIS — R0609 Other forms of dyspnea: Secondary | ICD-10-CM

## 2019-07-29 NOTE — Patient Instructions (Signed)
Make sure you check your oxygen saturations at highest level of activity to be sure it stays over 90% and call if losing ground with your 02 sats or exercise tolerance    If you are satisfied with your treatment plan,  let your doctor know and he/she can either refill your medications or you can return here when your prescription runs out.     If in any way you are not 100% satisfied,  please tell us.  If 100% better, tell your friends!  Pulmonary follow up is as needed

## 2019-07-29 NOTE — Assessment & Plan Note (Signed)
Never smoker See CT chest from [facility] 04/04/16 = 4 mm LUL - see post covid CT 11/20/2018 nodules as large as 6 mm - CT chest 05/17/2019 1. The dominant nodule in the right lower lobe has reduced in size compared to the prior exam, currently measuring 0.8 by 0.6 cm, previously 1.0 by 0.7 cm. Also reduced in solidity. Appearance indicative of inflammatory rather than malignant etiology. 2. The small left upper lobe nodule is chronically stable and the small right apical nodularity appears to be part of the biapical pleuroparenchymal scarring, highly likely to be benign. 3. Marked improvement in the previously seen peripheral bandlike subpleural reticulation and densities, favoring improving nonspecific interstitial pneumonia. Mild residual mosaic attenuation in the lungs.>>> no directed f/u needed in this setting

## 2019-07-29 NOTE — Progress Notes (Signed)
Subjective:     Patient ID: Joanne Reese, female   DOB: 1945-02-06,   MRN: [id number]    Brief patient profile:  81 yowf never smoker freq travel to [facility] to visit son with onset of cough when boarded in [place] in March 2018  then couldn't stop coughing after arrival to [facility] and so several days after arriving cxr > CTa 04/04/16 no PE  due to wearing boot on arrival  > 4 mm so referred to pulmonary clinic 08/05/2016 by Dr   Robbie Lis in [place]    History of Present Illness  08/05/2016 [facility] office visit/ Joanne Reese   Chief Complaint  Patient presents with  . Pulmonary Consult    Self referral for eval of pulmonary nodule.  Pt states went to ER in [place] in 04/04/16 for eval of cough. CTA was done to r/o PE b/c of recent travel- neg for PE.    rx zpak/ tessalon and cough resolved within a few days  rec Fleischner Society recommendations for incidental pulmonary nodules for you = no more scans needed Please call for cough/ chest pain with breathing or unexplained shortness of breath   11/19/2018 acute extended ov/Joanne Reese re: new sob/cough p covid 19  Chief Complaint  Patient presents with  . Acute Visit    Pt tested positive for covid 9/29. Pt has been having complaints of SOB, pain in lungs, and occ cough.    Acute onset  10/09/2018 chills/pnds in [place] seen 9/28 [facility] rx as sinus infections and tested POS 10/13/2018 zpak / prednisone x 21 day x 10 mg per day s taper with some  improvement in all symptoms except sob /cough and anterior gen discomfort from coughing.   Dyspnea:  yardwork x 20 min,  Ok flat and nl pace  Cough: dry, more at hs  Sleeping: ok flat SABA use: none  02: none  rec  First take delsym two tsp every 12 hours and supplement if needed with  tramadol 50 mg up to 1-2 every 4 hours to suppress the urge to cough at all or even clear your throat. Once you have eliminated the cough for 3 straight days try reducing the tramadol first,  then the  delsym as tolerated.   Prednisone 10 mg take  4 each am x 2 days,   2 each am x 2 days,  1 each am x 2 days and stop (this is to eliminate allergies and inflammation from coughing) Protonix (pantoprazole) Take 30-60 min before first meal of the day and Pepcid 20 mg one bedtime until return   GERD diet    12/03/2018  f/u ov/Joanne Reese re:  Post covid 10/09/2018  Chief Complaint  Patient presents with  . Follow-up    Breathing has improved some and she is no longer coughing.   Dyspnea:  Walking with friend qod x 50 min mostly flat and can't talk full sentences while walking but trend is better / still some sense of midline chest discomfort with deep breath  Cough: gone  Sleeping: able to lie flat, one pillow SABA use: none  02: none  rec Stop protonix now and take pepcid 20 mg after after bfast and supper for 2 weeks then just after supper x 2 weeks then stop - if the cough recurs > restart the protonix  GERD  diet Keep up the walking >  Make sure you check your oxygen saturations at highest level of activity to be sure it  stays over 90%     07/29/2019  f/u ov/Joanne Reese re: post covid sob /no cough flare off gerd rx  Chief Complaint  Patient presents with  . Follow-up    No complaints  Dyspnea:  No longer ex/ not checking sats   Did restart walking qod 30 min x one week  Cough: none Sleeping: flat ok / one pillow SABA use: non  02: none    No obvious day to day or daytime variability or assoc excess/ purulent sputum or mucus plugs or hemoptysis or cp or chest tightness, subjective wheeze or overt sinus or hb symptoms.   sleepingh  without nocturnal  or early am exacerbation  of respiratory  c/o's or need for noct saba. Also denies any obvious fluctuation of symptoms with weather or environmental changes or other aggravating or alleviating factors except as outlined above   No unusual exposure hx or h/o childhood pna/ asthma or knowledge of premature birth.  Current Allergies, Complete Past  Medical History, Past Surgical History, Family History, and Social History were reviewed in [employer] record.  ROS  The following are not active complaints unless bolded Hoarseness, sore throat, dysphagia, dental problems, itching, sneezing,  nasal congestion or discharge of excess mucus or purulent secretions, ear ache,   fever, chills, sweats, unintended wt loss or wt gain, classically pleuritic or exertional cp,  orthopnea pnd or arm/hand swelling  or leg swelling, presyncope, palpitations, abdominal pain, anorexia, nausea, vomiting, diarrhea  or change in bowel habits or change in bladder habits, change in stools or change in urine, dysuria, hematuria,  rash, arthralgias, visual complaints, headache, numbness, weakness or ataxia or problems with walking or coordination,  change in mood or  memory.        Current Meds  Medication Sig  . acyclovir (ZOVIRAX) 400 MG tablet Take 400 mg by mouth as needed (for outbreaks). 1 tablet BID as needed.  Marland Kitchen acyclovir cream (ZOVIRAX) 5 % Apply 1 application topically as needed.  Marland Kitchen amLODipine (NORVASC) 2.5 MG tablet Take 1 tablet (2.5 mg total) by mouth daily.  Marland Kitchen aspirin EC 81 MG tablet Take 81 mg by mouth daily. Swallow whole.  . Biotin 10000 MCG TABS Take by mouth. daily  . Cholecalciferol (VITAMIN D3) 50 MCG (2000 UT) capsule Take 1 capsule by mouth daily.   Marland Kitchen ELDERBERRY PO Take 1,250 mg PE by mouth.   . levothyroxine (SYNTHROID) 112 MCG tablet Take 112 mcg by mouth daily before breakfast.  . simvastatin (ZOCOR) 20 MG tablet TAKE 1 TABLET BY MOUTH IN THE EVENING                          Objective:   Physical Exam     07/29/2019        193  12/03/2018      194   11/19/2018       192   08/05/16 195 lb (88.5 kg)  12/06/13 192 lb (87.1 kg)  12/06/13 192 lb (87.1 kg)     Vital signs reviewed  07/29/2019  - Note at rest 02 sats  98% on RA    Pleasant amb wf nad   HEENT : pt wearing mask not removed for exam due to  covid -19 concerns.    NECK :  without JVD/Nodes/TM/ nl carotid upstrokes bilaterally   LUNGS: no acc muscle use,  Nl contour chest which is clear to A and P bilaterally without cough on  insp or exp maneuvers   CV:  RRR  no s3 or murmur or increase in P2, and no edema   ABD:  soft and nontender with nl inspiratory excursion in the supine position. No bruits or organomegaly appreciated, bowel sounds nl  MS:  Nl gait/ ext warm without deformities, calf tenderness, cyanosis or clubbing No obvious joint restrictions   SKIN: warm and dry without lesions    NEURO:  alert, approp, nl sensorium with  no motor or cerebellar deficits apparent.      I personally reviewed images and agree with radiology impression as follows:   Chest CT 05/17/19 w/o contrast 1. The dominant nodule in the right lower lobe has reduced in size compared to the prior exam, currently measuring 0.8 by 0.6 cm, previously 1.0 by 0.7 cm. Also reduced in solidity. Appearance indicative of inflammatory rather than malignant etiology. 2. The small left upper lobe nodule is chronically stable and the small right apical nodularity appears to be part of the biapical pleuroparenchymal scarring, highly likely to be benign. 3. Marked improvement in the previously seen peripheral bandlike subpleural reticulation and densities, favoring improving nonspecific interstitial pneumonia. Mild residual mosaic attenuation in the lungs.       Assessment:

## 2019-07-29 NOTE — Assessment & Plan Note (Addendum)
Onset with covid 19 documented 10/13/18  -  11/19/2018   Walked RA  2 laps @  approx 263ft each @ moderate pace  stopped due to  Sob with sats 92% - 11/19/2018 dimer =  1.30  - CTa 11/20/2018  > c/w covid 19 prior infection -  Chest CT 05/17/19 w/o contrast 1. The dominant nodule in the right lower lobe has reduced in size compared to the prior exam, currently measuring 0.8 by 0.6 cm, previously 1.0 by 0.7 cm. Also reduced in solidity. Appearance indicative of inflammatory rather than malignant etiology. 2. The small left upper lobe nodule is chronically stable and the small right apical nodularity appears to be part of the biapical pleuroparenchymal scarring, highly likely to be benign. 3. Marked improvement in the previously seen peripheral bandlike subpleural reticulation and densities, favoring improving nonspecific interstitial pneumonia. Mild residual mosaic attenuation in the lungs. -   07/29/2019   Walked RA  2 laps @ approx 245ft each @ fast  pace  stopped due to end of study, no sob with sats 97% at end   Fully recovered at this point with reconditioning still an issue but no pulmonary f/u needed.           Each maintenance medication was reviewed in detail including emphasizing most importantly the difference between maintenance and prns and under what circumstances the prns are to be triggered using an action plan format where appropriate.  Total time for H and P, chart review, counseling  directly observing portions of ambulatory 02 saturation study/  and generating customized AVS unique to this summary final office visit / charting = 18 min

## 2019-08-19 ENCOUNTER — Other Ambulatory Visit: Payer: Self-pay | Admitting: Internal Medicine

## 2019-08-22 DIAGNOSIS — J0100 Acute maxillary sinusitis, unspecified: Secondary | ICD-10-CM | POA: Diagnosis not present

## 2019-08-22 DIAGNOSIS — Z20828 Contact with and (suspected) exposure to other viral communicable diseases: Secondary | ICD-10-CM | POA: Diagnosis not present

## 2019-09-13 ENCOUNTER — Encounter: Payer: Self-pay | Admitting: Internal Medicine

## 2019-09-13 ENCOUNTER — Other Ambulatory Visit: Payer: Self-pay

## 2019-09-13 ENCOUNTER — Other Ambulatory Visit: Payer: Self-pay | Admitting: Internal Medicine

## 2019-09-13 ENCOUNTER — Ambulatory Visit (INDEPENDENT_AMBULATORY_CARE_PROVIDER_SITE_OTHER): Payer: Medicare Other | Admitting: Internal Medicine

## 2019-09-13 VITALS — BP 124/64 | HR 84 | Temp 98.0°F | Ht 61.6 in | Wt 197.8 lb

## 2019-09-13 DIAGNOSIS — E6609 Other obesity due to excess calories: Secondary | ICD-10-CM

## 2019-09-13 DIAGNOSIS — F329 Major depressive disorder, single episode, unspecified: Secondary | ICD-10-CM | POA: Diagnosis not present

## 2019-09-13 DIAGNOSIS — Z6836 Body mass index (BMI) 36.0-36.9, adult: Secondary | ICD-10-CM | POA: Diagnosis not present

## 2019-09-13 DIAGNOSIS — I10 Essential (primary) hypertension: Secondary | ICD-10-CM | POA: Diagnosis not present

## 2019-09-13 NOTE — Patient Instructions (Signed)
Blood Pressure Record Sheet To take your blood pressure, you will need a blood pressure machine. You can buy a blood pressure machine (blood pressure monitor) at your clinic, drug store, or online. When choosing one, consider:  An automatic monitor that has an arm cuff.  A cuff that wraps snugly around your upper arm. You should be able to fit only one finger between your arm and the cuff.  A device that stores blood pressure reading results.  Do not choose a monitor that measures your blood pressure from your wrist or finger. Follow your health care provider's instructions for how to take your blood pressure. To use this form:  Get one reading in the morning (a.m.) before you take any medicines.  Get one reading in the evening (p.m.) before supper.  Take at least 2 readings with each blood pressure check. This makes sure the results are correct. Wait 1-2 minutes between measurements.  Write down the results in the spaces on this form.  Repeat this once a week, or as told by your health care provider.  Make a follow-up appointment with your health care provider to discuss the results. Blood pressure log Date: _______________________  a.m. _____________________(1st reading) _____________________(2nd reading)  p.m. _____________________(1st reading) _____________________(2nd reading) Date: _______________________  a.m. _____________________(1st reading) _____________________(2nd reading)  p.m. _____________________(1st reading) _____________________(2nd reading) Date: _______________________  a.m. _____________________(1st reading) _____________________(2nd reading)  p.m. _____________________(1st reading) _____________________(2nd reading) Date: _______________________  a.m. _____________________(1st reading) _____________________(2nd reading)  p.m. _____________________(1st reading) _____________________(2nd reading) Date: _______________________  a.m.  _____________________(1st reading) _____________________(2nd reading)  p.m. _____________________(1st reading) _____________________(2nd reading) This information is not intended to replace advice given to you by your health care provider. Make sure you discuss any questions you have with your health care provider. Document Revised: 02/28/2017 Document Reviewed: 12/31/2016 Elsevier Patient Education  2020 Elsevier Inc.  

## 2019-09-13 NOTE — Progress Notes (Signed)
I,Tianna Badgett,acting as a Education administrator for [name], MD.,have documented all relevant documentation on the behalf of [name], MD,as directed by  [name], MD while in the presence of [name], MD.  This visit occurred during the SARS-CoV-2 public health emergency.  Safety protocols were in place, including screening questions prior to the visit, additional usage of staff PPE, and extensive cleaning of exam room while observing appropriate contact time as indicated for disinfecting solutions.  Subjective:     Patient ID: Joanne Reese , female    DOB: Jun 11, 1945 , 74 y.o.   MRN: [id number]   Chief Complaint  Patient presents with  . Hypertension    HPI  She is here today for bp check. She brought her bp cuff to check along with our reading. Home BP log has blood pressure readings that range from 131/84-181/98. She admits one time machine read BP in 200s.   Hypertension This is a new problem. The current episode started more than 1 month ago. The problem has been rapidly improving since onset. The problem is controlled. Risk factors for coronary artery disease include dyslipidemia and post-menopausal state. Past treatments include calcium channel blockers. The current treatment provides moderate improvement.     Past Medical History:  Diagnosis Date  . Arthritis   . Depression   . GERD (gastroesophageal reflux disease)    occ  . Hypothyroidism      Family History  Problem Relation Age of Onset  . Emphysema Father      Current Outpatient Medications:  .  acyclovir (ZOVIRAX) 400 MG tablet, Take 400 mg by mouth as needed (for outbreaks). 1 tablet BID as needed., Disp: , Rfl:  .  acyclovir cream (ZOVIRAX) 5 %, Apply 1 application topically as needed., Disp: , Rfl:  .  amLODipine (NORVASC) 2.5 MG tablet, TAKE 1 TABLET BY MOUTH EVERY DAY, Disp: 90 tablet, Rfl: 1 .  aspirin EC 81 MG tablet, Take 81 mg by mouth daily. Swallow whole., Disp: , Rfl:  .   Biotin 10000 MCG TABS, Take by mouth. daily, Disp: , Rfl:  .  Cholecalciferol (VITAMIN D3) 50 MCG (2000 UT) capsule, Take 1 capsule by mouth daily. , Disp: , Rfl:  .  levothyroxine (SYNTHROID) 112 MCG tablet, Take 112 mcg by mouth daily before breakfast., Disp: , Rfl:  .  simvastatin (ZOCOR) 20 MG tablet, TAKE 1 TABLET BY MOUTH IN THE EVENING, Disp: 90 tablet, Rfl: 2   Allergies  Allergen Reactions  . Nitrofurantoin Anaphylaxis  . Pneumococcal Vaccines Other (See Comments)    LOCAL REACTION Redness & Pain around injection site  Pneumoxvac 23 (active) per patient report     Review of Systems  Constitutional: Negative.   Respiratory: Negative.   Cardiovascular: Negative.   Gastrointestinal: Negative.   Neurological: Negative.      Today's Vitals   09/13/19 1504  BP: 124/64  Pulse: 84  Temp: 98 F (36.7 C)  TempSrc: Oral  Weight: 197 lb 12.8 oz (89.7 kg)  Height: 5' 1.6" (1.565 m)  PainSc: 0-No pain   Body mass index is 36.65 kg/m.   Objective:  Physical Exam Vitals and nursing note reviewed.  Constitutional:      Appearance: Normal appearance. She is obese.  HENT:     Head: Normocephalic and atraumatic.  Cardiovascular:     Rate and Rhythm: Normal rate and regular rhythm.     Heart sounds: Normal heart sounds.  Pulmonary:  Effort: Pulmonary effort is normal.     Breath sounds: Normal breath sounds.  Skin:    General: Skin is warm.  Neurological:     General: No focal deficit present.     Mental Status: She is alert.  Psychiatric:        Mood and Affect: Mood normal.        Behavior: Behavior normal.         Assessment And Plan:     1. Essential hypertension, benign Comments: Chronic, improved control. She will continue with current meds. She is encouraged to avoid adding salt to her foods. Also encouraged to resume her regular exercise regimen.   2. Reactive depression Comments: I will refer her for therapy. She does not wish to start medication at  this time. - Ambulatory referral to Psychology  3. Class 2 obesity due to excess calories without serious comorbidity with body mass index (BMI) of 36.0 to 36.9 in adult Comments: She is encouraged to strive for BMI less than 30 to decrease cardiac risk. Advised to aim for at least 150 minutes of exercise per week .     Patient was given opportunity to ask questions. Patient verbalized understanding of the plan and was able to repeat key elements of the plan. All questions were answered to their satisfaction.  [name], MD   I, [name], MD, have reviewed all documentation for this visit. The documentation on 09/13/19 for the exam, diagnosis, procedures, and orders are all accurate and complete.  THE PATIENT IS ENCOURAGED TO PRACTICE SOCIAL DISTANCING DUE TO THE COVID-19 PANDEMIC.

## 2019-09-24 DIAGNOSIS — R0981 Nasal congestion: Secondary | ICD-10-CM | POA: Diagnosis not present

## 2019-09-24 DIAGNOSIS — Z20828 Contact with and (suspected) exposure to other viral communicable diseases: Secondary | ICD-10-CM | POA: Diagnosis not present

## 2019-09-24 DIAGNOSIS — J069 Acute upper respiratory infection, unspecified: Secondary | ICD-10-CM | POA: Diagnosis not present

## 2019-10-08 DIAGNOSIS — K219 Gastro-esophageal reflux disease without esophagitis: Secondary | ICD-10-CM | POA: Diagnosis not present

## 2019-10-08 DIAGNOSIS — R682 Dry mouth, unspecified: Secondary | ICD-10-CM | POA: Diagnosis not present

## 2019-10-08 DIAGNOSIS — Z8616 Personal history of COVID-19: Secondary | ICD-10-CM | POA: Diagnosis not present

## 2019-10-08 DIAGNOSIS — J342 Deviated nasal septum: Secondary | ICD-10-CM | POA: Diagnosis not present

## 2019-10-18 ENCOUNTER — Ambulatory Visit (INDEPENDENT_AMBULATORY_CARE_PROVIDER_SITE_OTHER): Payer: Medicare Other | Admitting: Otolaryngology

## 2019-11-09 DIAGNOSIS — E890 Postprocedural hypothyroidism: Secondary | ICD-10-CM | POA: Diagnosis not present

## 2019-11-11 DIAGNOSIS — D225 Melanocytic nevi of trunk: Secondary | ICD-10-CM | POA: Diagnosis not present

## 2019-11-11 DIAGNOSIS — L738 Other specified follicular disorders: Secondary | ICD-10-CM | POA: Diagnosis not present

## 2019-11-11 DIAGNOSIS — D2262 Melanocytic nevi of left upper limb, including shoulder: Secondary | ICD-10-CM | POA: Diagnosis not present

## 2019-11-11 DIAGNOSIS — L820 Inflamed seborrheic keratosis: Secondary | ICD-10-CM | POA: Diagnosis not present

## 2019-11-11 DIAGNOSIS — L821 Other seborrheic keratosis: Secondary | ICD-10-CM | POA: Diagnosis not present

## 2019-11-11 DIAGNOSIS — L814 Other melanin hyperpigmentation: Secondary | ICD-10-CM | POA: Diagnosis not present

## 2019-11-11 DIAGNOSIS — D1801 Hemangioma of skin and subcutaneous tissue: Secondary | ICD-10-CM | POA: Diagnosis not present

## 2019-11-11 DIAGNOSIS — D224 Melanocytic nevi of scalp and neck: Secondary | ICD-10-CM | POA: Diagnosis not present

## 2019-11-11 DIAGNOSIS — L813 Cafe au lait spots: Secondary | ICD-10-CM | POA: Diagnosis not present

## 2019-11-22 ENCOUNTER — Ambulatory Visit: Payer: Medicare Other | Admitting: Internal Medicine

## 2019-12-02 ENCOUNTER — Other Ambulatory Visit: Payer: Self-pay

## 2019-12-02 MED ORDER — LEVOTHYROXINE SODIUM 112 MCG PO TABS: 112.0000 ug | ORAL_TABLET | Freq: Every day | ORAL | 1 refills | Status: DC

## 2019-12-06 DIAGNOSIS — Z20828 Contact with and (suspected) exposure to other viral communicable diseases: Secondary | ICD-10-CM | POA: Diagnosis not present

## 2019-12-06 DIAGNOSIS — K529 Noninfective gastroenteritis and colitis, unspecified: Secondary | ICD-10-CM | POA: Diagnosis not present

## 2019-12-27 ENCOUNTER — Ambulatory Visit (INDEPENDENT_AMBULATORY_CARE_PROVIDER_SITE_OTHER): Payer: Medicare Other | Admitting: Internal Medicine

## 2019-12-27 ENCOUNTER — Encounter: Payer: Self-pay | Admitting: Internal Medicine

## 2019-12-27 ENCOUNTER — Other Ambulatory Visit: Payer: Self-pay

## 2019-12-27 VITALS — BP 122/80 | HR 92 | Temp 98.0°F | Ht 61.6 in | Wt 201.6 lb

## 2019-12-27 DIAGNOSIS — Z6837 Body mass index (BMI) 37.0-37.9, adult: Secondary | ICD-10-CM | POA: Diagnosis not present

## 2019-12-27 DIAGNOSIS — E7800 Pure hypercholesterolemia, unspecified: Secondary | ICD-10-CM | POA: Diagnosis not present

## 2019-12-27 DIAGNOSIS — I10 Essential (primary) hypertension: Secondary | ICD-10-CM | POA: Diagnosis not present

## 2019-12-27 DIAGNOSIS — R7309 Other abnormal glucose: Secondary | ICD-10-CM

## 2019-12-27 DIAGNOSIS — I700 Atherosclerosis of aorta: Secondary | ICD-10-CM

## 2019-12-27 DIAGNOSIS — E039 Hypothyroidism, unspecified: Secondary | ICD-10-CM

## 2019-12-27 NOTE — Patient Instructions (Signed)

## 2019-12-27 NOTE — Progress Notes (Signed)
I,Katawbba Wiggins,acting as a Education administrator for [name], MD.,have documented all relevant documentation on the behalf of [name], MD,as directed by  [name], MD while in the presence of [name], MD.  This visit occurred during the SARS-CoV-2 public health emergency.  Safety protocols were in place, including screening questions prior to the visit, additional usage of staff PPE, and extensive cleaning of exam room while observing appropriate contact time as indicated for disinfecting solutions.  Subjective:     Patient ID: Joanne Reese , female    DOB: 08-22-1945 , 74 y.o.   MRN: [id number]   Chief Complaint  Patient presents with  . Hypothyroidism    HPI  She is here today for bp and thyroid f/u.  She reports compliance with meds. She admits she is not exercising on a regular basis.   Hypertension This is a new problem. The current episode started more than 1 month ago. The problem has been rapidly improving since onset. The problem is controlled. Risk factors for coronary artery disease include dyslipidemia and post-menopausal state. Past treatments include calcium channel blockers. The current treatment provides moderate improvement.     Past Medical History:  Diagnosis Date  . Arthritis   . Depression   . GERD (gastroesophageal reflux disease)    occ  . Hypothyroidism      Family History  Problem Relation Age of Onset  . Emphysema Father      Current Outpatient Medications:  .  acyclovir (ZOVIRAX) 400 MG tablet, Take 400 mg by mouth as needed (for outbreaks). 1 tablet BID as needed., Disp: , Rfl:  .  acyclovir cream (ZOVIRAX) 5 %, Apply 1 application topically as needed., Disp: , Rfl:  .  amLODipine (NORVASC) 2.5 MG tablet, TAKE 1 TABLET BY MOUTH EVERY DAY, Disp: 90 tablet, Rfl: 1 .  aspirin EC 81 MG tablet, Take 81 mg by mouth daily. Swallow whole., Disp: , Rfl:  .  Biotin 10000 MCG TABS, Take by mouth. daily, Disp: , Rfl:  .   Cholecalciferol (VITAMIN D3) 50 MCG (2000 UT) capsule, Take 1 capsule by mouth daily. , Disp: , Rfl:  .  levothyroxine (SYNTHROID) 112 MCG tablet, Take 1 tablet (112 mcg total) by mouth daily before breakfast., Disp: 90 tablet, Rfl: 1 .  pantoprazole (PROTONIX) 20 MG tablet, Take by mouth., Disp: , Rfl:  .  simvastatin (ZOCOR) 20 MG tablet, TAKE 1 TABLET BY MOUTH IN THE EVENING, Disp: 90 tablet, Rfl: 2   Allergies  Allergen Reactions  . Nitrofurantoin Anaphylaxis  . Pneumococcal Vaccines Other (See Comments)    LOCAL REACTION Redness & Pain around injection site  Pneumoxvac 23 (active) per patient report     Review of Systems  Constitutional: Negative.   Respiratory: Negative.   Cardiovascular: Negative.   Gastrointestinal: Negative.   Neurological: Negative.   Psychiatric/Behavioral: Negative.      Today's Vitals   12/27/19 1511  BP: 122/80  Pulse: 92  Temp: 98 F (36.7 C)  TempSrc: Oral  Weight: 201 lb 9.6 oz (91.4 kg)  Height: 5' 1.6" (1.565 m)  PainSc: 0-No pain   Body mass index is 37.35 kg/m.  Wt Readings from Last 3 Encounters:  12/27/19 201 lb 9.6 oz (91.4 kg)  09/13/19 197 lb 12.8 oz (89.7 kg)  07/29/19 193 lb 6.4 oz (87.7 kg)   Objective:  Physical Exam Vitals and nursing note reviewed.  Constitutional:      Appearance: Normal appearance. She  is obese.  HENT:     Head: Normocephalic and atraumatic.  Cardiovascular:     Rate and Rhythm: Normal rate and regular rhythm.     Heart sounds: Normal heart sounds.  Pulmonary:     Effort: Pulmonary effort is normal.     Breath sounds: Normal breath sounds.  Skin:    General: Skin is warm.  Neurological:     General: No focal deficit present.     Mental Status: She is alert.  Psychiatric:        Mood and Affect: Mood normal.        Behavior: Behavior normal.         Assessment And Plan:     1. Essential hypertension, benign Comments: Chronic, well controlled. She will continue with current meds. She  is encouraged to avoid adding salt to her foods. I will check renal function.  - BMP8+EGFR  2. Primary hypothyroidism Comments: Chronic. I will check thyroid panel and adjust meds as needed.  - TSH - T4, free  3. Other abnormal glucose Comments: Her a1c has been elevated in the past, I will recheck an a1c today. She is encouraged to avoid sugary beverages, including diet drinks.  - Insulin, random(561) - Hemoglobin A1c  4. Pure hypercholesterolemia Comments: Chronic. Advised to avoid fried foods and increase daily activity. She will c/w current meds.   5. Aortic atherosclerosis (HCC) Comments: Chronic. She is encouraged to follow a heart healthy lifestyle. She is encouraged to comply with statin therapy.   6. Class 2 severe obesity due to excess calories with serious comorbidity and body mass index (BMI) of 37.0 to 37.9 in adult [facility]) Comments: She is encouraged to strive for BMI less than 30 to decrease cardiac risk. She is advised to aim for at least 150 minutes of exercise per week.   Patient was given opportunity to ask questions. Patient verbalized understanding of the plan and was able to repeat key elements of the plan. All questions were answered to their satisfaction.  [name], MD   I, [name], MD, have reviewed all documentation for this visit. The documentation on 01/03/20 for the exam, diagnosis, procedures, and orders are all accurate and complete.  THE PATIENT IS ENCOURAGED TO PRACTICE SOCIAL DISTANCING DUE TO THE COVID-19 PANDEMIC.

## 2019-12-28 DIAGNOSIS — R053 Chronic cough: Secondary | ICD-10-CM | POA: Diagnosis not present

## 2019-12-28 DIAGNOSIS — K219 Gastro-esophageal reflux disease without esophagitis: Secondary | ICD-10-CM | POA: Diagnosis not present

## 2019-12-28 DIAGNOSIS — R070 Pain in throat: Secondary | ICD-10-CM | POA: Diagnosis not present

## 2019-12-28 LAB — BMP8+EGFR
BUN/Creatinine Ratio: 16 (ref 12–28)
BUN: 13 mg/dL (ref 8–27)
CO2: 26 mmol/L (ref 20–29)
Calcium: 9.1 mg/dL (ref 8.7–10.3)
Chloride: 103 mmol/L (ref 96–106)
Creatinine, Ser: 0.8 mg/dL (ref 0.57–1.00)
GFR calc Af Amer: 85 mL/min/{1.73_m2} (ref >59)
GFR calc non Af Amer: 74 mL/min/{1.73_m2} (ref >59)
Glucose: 92 mg/dL (ref 65–99)
Potassium: 3.8 mmol/L (ref 3.5–5.2)
Sodium: 142 mmol/L (ref 134–144)

## 2019-12-28 LAB — HEMOGLOBIN A1C
Est. average glucose Bld gHb Est-mCnc: 123 mg/dL
Hgb A1c MFr Bld: 5.9 % — ABNORMAL HIGH (ref 4.8–5.6)

## 2019-12-28 LAB — TSH: TSH: 2.88 u[IU]/mL (ref 0.450–4.500)

## 2019-12-28 LAB — T4, FREE: Free T4: 1.55 ng/dL (ref 0.82–1.77)

## 2019-12-28 LAB — INSULIN, RANDOM: INSULIN: 17 u[IU]/mL (ref 2.6–24.9)

## 2020-01-06 DIAGNOSIS — Z808 Family history of malignant neoplasm of other organs or systems: Secondary | ICD-10-CM | POA: Diagnosis not present

## 2020-01-06 DIAGNOSIS — E890 Postprocedural hypothyroidism: Secondary | ICD-10-CM | POA: Diagnosis not present

## 2020-03-03 ENCOUNTER — Encounter: Payer: Self-pay | Admitting: Internal Medicine

## 2020-03-06 LAB — HM MAMMOGRAPHY

## 2020-03-21 ENCOUNTER — Other Ambulatory Visit: Payer: Self-pay | Admitting: Internal Medicine

## 2020-03-29 ENCOUNTER — Other Ambulatory Visit: Payer: Self-pay | Admitting: Internal Medicine

## 2020-05-25 ENCOUNTER — Encounter: Payer: Self-pay | Admitting: Internal Medicine

## 2020-05-25 ENCOUNTER — Ambulatory Visit: Payer: Medicare Other | Admitting: Internal Medicine

## 2020-05-25 ENCOUNTER — Other Ambulatory Visit: Payer: Self-pay

## 2020-05-25 ENCOUNTER — Ambulatory Visit (INDEPENDENT_AMBULATORY_CARE_PROVIDER_SITE_OTHER): Payer: Medicare Other

## 2020-05-25 VITALS — BP 130/80 | HR 78 | Temp 98.1°F | Ht 61.0 in | Wt 195.0 lb

## 2020-05-25 VITALS — BP 130/80 | HR 78 | Temp 98.1°F | Ht 61.6 in | Wt 195.0 lb

## 2020-05-25 DIAGNOSIS — R7309 Other abnormal glucose: Secondary | ICD-10-CM

## 2020-05-25 DIAGNOSIS — E039 Hypothyroidism, unspecified: Secondary | ICD-10-CM | POA: Diagnosis not present

## 2020-05-25 DIAGNOSIS — I10 Essential (primary) hypertension: Secondary | ICD-10-CM

## 2020-05-25 DIAGNOSIS — I700 Atherosclerosis of aorta: Secondary | ICD-10-CM

## 2020-05-25 DIAGNOSIS — Z0000 Encounter for general adult medical examination without abnormal findings: Secondary | ICD-10-CM

## 2020-05-25 DIAGNOSIS — E6609 Other obesity due to excess calories: Secondary | ICD-10-CM

## 2020-05-25 DIAGNOSIS — Z79899 Other long term (current) drug therapy: Secondary | ICD-10-CM

## 2020-05-25 DIAGNOSIS — Z6836 Body mass index (BMI) 36.0-36.9, adult: Secondary | ICD-10-CM

## 2020-05-25 LAB — CBC WITH DIFFERENTIAL/PLATELET
Basophils Absolute: 0.1 10*3/uL (ref 0.0–0.2)
Basos: 1 %
EOS (ABSOLUTE): 0.1 10*3/uL (ref 0.0–0.4)
Eos: 1 %
Hematocrit: 40.7 % (ref 34.0–46.6)
Hemoglobin: 12.3 g/dL (ref 11.1–15.9)
Immature Grans (Abs): 0 10*3/uL (ref 0.0–0.1)
Immature Granulocytes: 0 %
Lymphocytes Absolute: 1.7 10*3/uL (ref 0.7–3.1)
Lymphs: 28 %
MCH: 23.3 pg — ABNORMAL LOW (ref 26.6–33.0)
MCHC: 30.2 g/dL — ABNORMAL LOW (ref 31.5–35.7)
MCV: 77 fL — ABNORMAL LOW (ref 79–97)
Monocytes Absolute: 0.5 10*3/uL (ref 0.1–0.9)
Monocytes: 9 %
Neutrophils Absolute: 3.7 10*3/uL (ref 1.4–7.0)
Neutrophils: 61 %
Platelets: 271 10*3/uL (ref 150–450)
RBC: 5.29 x10E6/uL — ABNORMAL HIGH (ref 3.77–5.28)
RDW: 15.2 % (ref 11.7–15.4)
WBC: 6 10*3/uL (ref 3.4–10.8)

## 2020-05-25 LAB — POCT UA - MICROALBUMIN
Albumin/Creatinine Ratio, Urine, POC: <30
Creatinine, POC: 200 mg/dL
Microalbumin Ur, POC: 10 mg/L

## 2020-05-25 LAB — POCT URINALYSIS DIPSTICK
Bilirubin, UA: NEGATIVE
Blood, UA: NEGATIVE
Glucose, UA: NEGATIVE
Ketones, UA: NEGATIVE
Leukocytes, UA: NEGATIVE
Nitrite, UA: NEGATIVE
Protein, UA: NEGATIVE
Spec Grav, UA: >=1.03 — AB (ref 1.010–1.025)
Urobilinogen, UA: 0.2 E.U./dL
pH, UA: 5.5 (ref 5.0–8.0)

## 2020-05-25 NOTE — Progress Notes (Signed)
This visit occurred during the SARS-CoV-2 public health emergency.  Safety protocols were in place, including screening questions prior to the visit, additional usage of staff PPE, and extensive cleaning of exam room while observing appropriate contact time as indicated for disinfecting solutions.  Subjective:   Joanne Reese is a 75 y.o. female who presents for Medicare Annual (Subsequent) preventive examination.  Review of Systems     Cardiac Risk Factors include: advanced age (>54men, >6 women);hypertension;obesity (BMI >30kg/m2)     Objective:    Today's Vitals   05/25/20 0834  BP: 130/80  Pulse: 78  Temp: 98.1 F (36.7 C)  TempSrc: Oral  SpO2: 96%  Weight: 195 lb (88.5 kg)  Height: 5' 1.6" (1.565 m)   Body mass index is 36.13 kg/m.  Advanced Directives 05/25/2020 05/19/2019 04/30/2018 02/10/2017 01/28/2017  Does Patient Have a Medical Advance Directive? Yes Yes Yes No Yes  Type of Paramedic of [place];Living will [place];Living will [place];Living will - -  Copy of [place] in Chart? No - copy requested No - copy requested No - copy requested - -  Would patient like information on creating a medical advance directive? - - - No - Patient declined -    Current Medications (verified) Outpatient Encounter Medications as of 05/25/2020  Medication Sig  . acyclovir (ZOVIRAX) 400 MG tablet Take 400 mg by mouth as needed (for outbreaks). 1 tablet BID as needed.  Marland Kitchen amLODipine (NORVASC) 2.5 MG tablet TAKE 1 TABLET BY MOUTH EVERY DAY  . aspirin EC 81 MG tablet Take 81 mg by mouth daily. Swallow whole.  . Biotin 10000 MCG TABS Take by mouth. daily  . Cholecalciferol (VITAMIN D3) 50 MCG (2000 UT) capsule Take 1 capsule by mouth daily.   Marland Kitchen levothyroxine (SYNTHROID) 112 MCG tablet Take 1 tablet (112 mcg total) by mouth daily before breakfast.  . simvastatin (ZOCOR) 20 MG tablet TAKE 1 TABLET BY  MOUTH IN THE EVENING  . acyclovir cream (ZOVIRAX) 5 % Apply 1 application topically as needed. (Patient not taking: Reported on 05/25/2020)   No facility-administered encounter medications on file as of 05/25/2020.    Allergies (verified) Nitrofurantoin and Pneumococcal vaccines   History: Past Medical History:  Diagnosis Date  . Arthritis   . Depression   . GERD (gastroesophageal reflux disease)    occ  . Hypothyroidism    Past Surgical History:  Procedure Laterality Date  . CYST EXCISION     ovary  30+ yrs  . PARTIAL KNEE ARTHROPLASTY Right 02/10/2017   Procedure: RIGHT UNICOMPARTMENTAL KNEE;  Surgeon: Vickey Huger, MD;  Location: [facility];  Service: Orthopedics;  Laterality: Right;  . THYROID LOBECTOMY     Family History  Problem Relation Age of Onset  . Emphysema Father    Social History   Socioeconomic History  . Marital status: Widowed    Spouse name: Not on file  . Number of children: 4  . Years of education: Not on file  . Highest education level: Not on file  Occupational History  . Occupation: retired  Tobacco Use  . Smoking status: Never Smoker  . Smokeless tobacco: Never Used  Vaping Use  . Vaping Use: Never used  Substance and Sexual Activity  . Alcohol use: Not Currently    Comment: occasional  . Drug use: No  . Sexual activity: Not Currently  Other Topics Concern  . Not on file  Social History Narrative  .  Not on file   Social Determinants of Health   Financial Resource Strain: Low Risk   . Difficulty of Paying Living Expenses: Not hard at all  Food Insecurity: No Food Insecurity  . Worried About Programme researcher, broadcasting/film/video in the Last Year: Never true  . Ran Out of Food in the Last Year: Never true  Transportation Needs: No Transportation Needs  . Lack of Transportation (Medical): No  . Lack of Transportation (Non-Medical): No  Physical Activity: Sufficiently Active  . Days of Exercise per Week: 7 days  . Minutes of Exercise per Session: 40 min   Stress: No Stress Concern Present  . Feeling of Stress : Not at all  Social Connections: Not on file    Tobacco Counseling Counseling given: Not Answered   Clinical Intake:  Pre-visit preparation completed: Yes  Pain : No/denies pain     Nutritional Status: BMI > 30  Obese Nutritional Risks: None Diabetes: No  How often do you need to have someone help you when you read instructions, pamphlets, or other written materials from your doctor or pharmacy?: 1 - Never What is the last grade level you completed in school?: 12th grade  Diabetic? no  Interpreter Needed?: No  Information entered by :: NAllen LPN   Activities of Daily Living In your present state of health, do you have any difficulty performing the following activities: 05/25/2020  Hearing? N  Vision? N  Difficulty concentrating or making decisions? N  Walking or climbing stairs? N  Dressing or bathing? N  Doing errands, shopping? N  Preparing Food and eating ? N  Using the Toilet? N  In the past six months, have you accidently leaked urine? N  Do you have problems with loss of bowel control? N  Managing your Medications? N  Managing your Finances? N  Housekeeping or managing your Housekeeping? N  Some recent data might be hidden    Patient Care Team: Dorothyann Peng, MD as PCP - General (Internal Medicine) Sallye Lat, MD as Consulting Physician (Ophthalmology)  Indicate any recent Medical Services you may have received from other than [facility] providers in the past year (date may be approximate).     Assessment:   This is a routine wellness examination for [place].  Hearing/Vision screen  Hearing Screening   125Hz  250Hz  500Hz  1000Hz  2000Hz  3000Hz  4000Hz  6000Hz  8000Hz   Right ear:           Left ear:           Vision Screening Comments: Regular eye exams, [facility]  Dietary issues and exercise activities discussed: Current Exercise Habits: Home exercise routine, Type of exercise:  walking, Time (Minutes): 40, Frequency (Times/Week): 7, Weekly Exercise (Minutes/Week): 280  Goals Addressed            This Visit's Progress   . Patient Stated       05/25/2020, wants to weigh 175 pounds      Depression Screen PHQ 2/9 Scores 05/25/2020 12/27/2019 09/13/2019 05/19/2019 04/30/2018  PHQ - 2 Score 0 2 2 4 1   PHQ- 9 Score - 5 6 5 1   Exception Documentation - - Medical reason Other- indicate reason in comment box -  Not completed - - - just completed by Physician -    Fall Risk Fall Risk  05/25/2020 05/19/2019 05/19/2019 04/30/2018 08/05/2016  Falls in the past year? 0 0 0 0 No  Comment - - - - Emmi Telephone Survey: data to providers prior to load  Risk for fall due to : Medication side effect - - - -  Follow up Falls evaluation completed;Education provided;Falls prevention discussed Falls evaluation completed;Education provided;Falls prevention discussed - Education provided;Falls prevention discussed -    FALL RISK PREVENTION PERTAINING TO THE HOME:  Any stairs in or around the home? Yes  If so, are there any without handrails? No  Home free of loose throw rugs in walkways, pet beds, electrical cords, etc? Yes  Adequate lighting in your home to reduce risk of falls? Yes   ASSISTIVE DEVICES UTILIZED TO PREVENT FALLS:  Life alert? No  Use of a cane, walker or w/c? No  Grab bars in the bathroom? No  Shower chair or bench in shower? No  Elevated toilet seat or a handicapped toilet? No   TIMED UP AND GO:  Was the test performed? No .    Gait steady and fast without use of assistive device  Cognitive Function:     6CIT Screen 05/25/2020 05/19/2019 04/30/2018  What Year? 0 points 0 points 0 points  What month? 0 points 0 points 0 points  What time? 0 points 0 points 0 points  Count back from 20 0 points 0 points 0 points  Months in reverse 0 points 0 points 0 points  Repeat phrase 2 points 0 points 0 points  Total Score 2 0 0    Immunizations Immunization History   Administered Date(s) Administered  . Fluad Quad(high Dose 65+) 09/30/2018, 09/23/2019  . Moderna Sars-Covid-2 Vaccination 02/25/2019, 03/22/2019, 11/16/2019    TDAP status: Up to date  Flu Vaccine status: Up to date  Pneumococcal vaccine status: Declined,  Education has been provided regarding the importance of this vaccine but patient still declined. Advised may receive this vaccine at local pharmacy or Health Dept. Aware to provide a copy of the vaccination record if obtained from local pharmacy or Health Dept. Verbalized acceptance and understanding.   Covid-19 vaccine status: Completed vaccines  Qualifies for Shingles Vaccine? Yes   Zostavax completed Yes   Shingrix Completed?: No.    Education has been provided regarding the importance of this vaccine. Patient has been advised to call insurance company to determine out of pocket expense if they have not yet received this vaccine. Advised may also receive vaccine at local pharmacy or Health Dept. Verbalized acceptance and understanding.  Screening Tests Health Maintenance  Topic Date Due  . PNA vac Low Risk Adult (2 of 2 - PCV13) 06/05/2013  . MAMMOGRAM  02/15/2020  . INFLUENZA VACCINE  08/14/2020  . TETANUS/TDAP  11/27/2022  . COLONOSCOPY (Pts 45-68yrs Insurance coverage will need to be confirmed)  02/03/2023  . DEXA SCAN  Completed  . COVID-19 Vaccine  Completed  . Hepatitis C Screening  Completed  . HPV VACCINES  Aged Out    Health Maintenance  Health Maintenance Due  Topic Date Due  . PNA vac Low Risk Adult (2 of 2 - PCV13) 06/05/2013  . MAMMOGRAM  02/15/2020    Colorectal cancer screening: Type of screening: Colonoscopy. Completed 02/02/2013. Repeat every 10 years  Mammogram status: Completed 03/06/2020. Repeat every year  Bone Density status: Completed 03/19/2016.   Lung Cancer Screening: (Low Dose CT Chest recommended if Age 44-80 years, 30 pack-year currently smoking OR have quit w/in 15years.) does not qualify.    Lung Cancer Screening Referral: no  Additional Screening:  Hepatitis C Screening: does qualify; Completed 03/01/2016   Vision Screening: Recommended annual ophthalmology exams for early detection of glaucoma and other  disorders of the eye. Is the patient up to date with their annual eye exam?  Yes  Who is the provider or what is the name of the office in which the patient attends annual eye exams? [facility] If pt is not established with a provider, would they like to be referred to a provider to establish care? No .   Dental Screening: Recommended annual dental exams for proper oral hygiene  Community Resource Referral / Chronic Care Management: CRR required this visit?  No   CCM required this visit?  No      Plan:     I have personally reviewed and noted the following in the patient's chart:   . Medical and social history . Use of alcohol, tobacco or illicit drugs  . Current medications and supplements including opioid prescriptions.  . Functional ability and status . Nutritional status . Physical activity . Advanced directives . List of other physicians . Hospitalizations, surgeries, and ER visits in previous 12 months . Vitals . Screenings to include cognitive, depression, and falls . Referrals and appointments  In addition, I have reviewed and discussed with patient certain preventive protocols, quality metrics, and best practice recommendations. A written personalized care plan for preventive services as well as general preventive health recommendations were provided to patient.     Kellie Simmering, LPN   07/11/3660   Nurse Notes:

## 2020-05-25 NOTE — Progress Notes (Signed)
Earleen Newport as a Education administrator for [name], MD.,have documented all relevant documentation on the behalf of [name], MD,as directed by  [name], MD while in the presence of [name], MD. This visit occurred during the SARS-CoV-2 public health emergency.  Safety protocols were in place, including screening questions prior to the visit, additional usage of staff PPE, and extensive cleaning of exam room while observing appropriate contact time as indicated for disinfecting solutions.  Subjective:     Patient ID: Joanne Reese , female    DOB: [birth year] , 75 y.o.   MRN: [id number]   Chief Complaint  Patient presents with  . Hypertension    HPI  She is here today for bp F/U. She reports compliance with meds. She denies having any chest pains, headaches and shortness of breath. She reports she is exercising regularly.  Pt does want to talk about a new weight loss medication "Plenity'.  Hypertension This is a chronic problem. The current episode started more than 1 year ago. The problem has been rapidly improving since onset. The problem is controlled. Risk factors for coronary artery disease include dyslipidemia and post-menopausal state. Past treatments include calcium channel blockers. The current treatment provides moderate improvement.     Past Medical History:  Diagnosis Date  . Arthritis   . Depression   . GERD (gastroesophageal reflux disease)    occ  . Hypothyroidism      Family History  Problem Relation Age of Onset  . Emphysema Father      Current Outpatient Medications:  .  acyclovir (ZOVIRAX) 400 MG tablet, Take 400 mg by mouth as needed (for outbreaks). 1 tablet BID as needed., Disp: , Rfl:  .  acyclovir cream (ZOVIRAX) 5 %, Apply 1 application topically as needed. (Patient not taking: Reported on 05/25/2020), Disp: , Rfl:  .  amLODipine (NORVASC) 2.5 MG tablet, TAKE 1 TABLET BY MOUTH EVERY DAY, Disp: 90 tablet, Rfl: 1 .  aspirin  EC 81 MG tablet, Take 81 mg by mouth daily. Swallow whole., Disp: , Rfl:  .  Biotin 10000 MCG TABS, Take by mouth. daily, Disp: , Rfl:  .  Cholecalciferol (VITAMIN D3) 50 MCG (2000 UT) capsule, Take 1 capsule by mouth daily. , Disp: , Rfl:  .  levothyroxine (SYNTHROID) 112 MCG tablet, Take 1 tablet (112 mcg total) by mouth daily before breakfast., Disp: 90 tablet, Rfl: 1 .  simvastatin (ZOCOR) 20 MG tablet, TAKE 1 TABLET BY MOUTH IN THE EVENING, Disp: 90 tablet, Rfl: 2   Allergies  Allergen Reactions  . Nitrofurantoin Anaphylaxis  . Pneumococcal Vaccines Other (See Comments)    LOCAL REACTION Redness & Pain around injection site  Pneumoxvac 23 (active) per patient report     Review of Systems  Constitutional: Negative.   Respiratory: Negative.   Cardiovascular: Negative.   Neurological: Negative.   Psychiatric/Behavioral: Negative.      Today's Vitals   05/25/20 0852  BP: 130/80  Pulse: 78  Temp: 98.1 F (36.7 C)  Weight: 195 lb (88.5 kg)  Height: '5\' 1"'  (1.549 m)  PainSc: 0-No pain   Body mass index is 36.84 kg/m.  Wt Readings from Last 3 Encounters:  05/25/20 195 lb (88.5 kg)  05/25/20 195 lb (88.5 kg)  12/27/19 201 lb 9.6 oz (91.4 kg)    Objective:  Physical Exam Vitals and nursing note reviewed.  Constitutional:      Appearance: Normal appearance.  HENT:  Head: Normocephalic and atraumatic.     Nose:     Comments: Masked     Mouth/Throat:     Comments: Masked  Cardiovascular:     Rate and Rhythm: Normal rate and regular rhythm.     Heart sounds: Normal heart sounds.  Pulmonary:     Effort: Pulmonary effort is normal.     Breath sounds: Normal breath sounds.  Musculoskeletal:     Cervical back: Normal range of motion.  Skin:    General: Skin is warm.  Neurological:     General: No focal deficit present.     Mental Status: She is alert.  Psychiatric:        Mood and Affect: Mood normal.        Behavior: Behavior normal.         Assessment  And Plan:     1. Essential hypertension, benign Comments: Controlled,  encouraged to follow low sodium diet. No med changes today. I will check renal function.  She will f/u in six months.  - CMP14+EGFR - Lipid panel  2. Primary hypothyroidism Comments: I will check thyroid function and adjust meds as needed.  - TSH - T4, free  3. Atherosclerosis of aorta (HCC) Chronic, encouraged to follow heart healthy lifestyle. Clean eating, regular exercise and stress management are all a part of this lifestyle. She is also encouraged to continue with statin therapy.   - Lipid panel  4. Other abnormal glucose Comments: Her a1c has been elevated in the past. I will recheck this today. Encouraged to limit her intake of sweetened beverages, including diet.  - Hemoglobin A1c  5. Class 2 obesity due to excess calories without serious comorbidity with body mass index (BMI) of 36.0 to 36.9 in adult Comments: WE discussed Plenity and possible side effects. Advised of dosing schedule, 3 caps w/ 16 oz H20 before lunch/dinner.  She is interested in starting the medication.  Possible side effects were discussed with the patient. Will send rx [facility]. She agrees to f/u four weeks after receiving the medication.   6. Drug therapy  She is encouraged to strive for BMI less than 30 to decrease cardiac risk. Advised to aim for at least 150 minutes of exercise per week.    Patient was given opportunity to ask questions. Patient verbalized understanding of the plan and was able to repeat key elements of the plan. All questions were answered to their satisfaction.   I, [name], MD, have reviewed all documentation for this visit. The documentation on 05/25/20 for the exam, diagnosis, procedures, and orders are all accurate and complete.   IF YOU HAVE BEEN REFERRED TO A SPECIALIST, IT MAY TAKE 1-2 WEEKS TO SCHEDULE/PROCESS THE REFERRAL. IF YOU HAVE NOT HEARD FROM US/SPECIALIST IN TWO WEEKS, PLEASE GIVE  [place] A CALL AT [phone].   THE PATIENT IS ENCOURAGED TO PRACTICE SOCIAL DISTANCING DUE TO THE COVID-19 PANDEMIC.

## 2020-05-25 NOTE — Patient Instructions (Signed)
Joanne Reese , Thank you for taking time to come for your Medicare Wellness Visit. I appreciate your ongoing commitment to your health goals. Please review the following plan we discussed and let me know if I can assist you in the future.   Screening recommendations/referrals: Colonoscopy: completed 02/02/2013 Mammogram: completed 03/06/2020 Bone Density: completed 03/19/2016 Recommended yearly ophthalmology/optometry visit for glaucoma screening and checkup Recommended yearly dental visit for hygiene and checkup  Vaccinations: Influenza vaccine: completed 09/23/2019, due 08/14/2020 Pneumococcal vaccine: allergy Tdap vaccine: completed 11/26/2012, due 11/27/2022 Shingles vaccine: discussed   Covid-19: 05/16/2020, 11/16/2019, 03/22/2019, 02/25/2019  Advanced directives: Please bring a copy of your POA (Power of Attorney) and/or Living Will to your next appointment.   Conditions/risks identified: none  Next appointment: Follow up in one year for your annual wellness visit    Preventive Care 65 Years and Older, Female Preventive care refers to lifestyle choices and visits with your health care provider that can promote health and wellness. What does preventive care include?  A yearly physical exam. This is also called an annual well check.  Dental exams once or twice a year.  Routine eye exams. Ask your health care provider how often you should have your eyes checked.  Personal lifestyle choices, including:  Daily care of your teeth and gums.  Regular physical activity.  Eating a healthy diet.  Avoiding tobacco and drug use.  Limiting alcohol use.  Practicing safe sex.  Taking low-dose aspirin every day.  Taking vitamin and mineral supplements as recommended by your health care provider. What happens during an annual well check? The services and screenings done by your health care provider during your annual well check will depend on your age, overall health, lifestyle risk factors,  and family history of disease. Counseling  Your health care provider may ask you questions about your:  Alcohol use.  Tobacco use.  Drug use.  Emotional well-being.  Home and relationship well-being.  Sexual activity.  Eating habits.  History of falls.  Memory and ability to understand (cognition).  Work and work Statistician.  Reproductive health. Screening  You may have the following tests or measurements:  Height, weight, and BMI.  Blood pressure.  Lipid and cholesterol levels. These may be checked every 5 years, or more frequently if you are over 88 years old.  Skin check.  Lung cancer screening. You may have this screening every year starting at age 42 if you have a 30-pack-year history of smoking and currently smoke or have quit within the past 15 years.  Fecal occult blood test (FOBT) of the stool. You may have this test every year starting at age 51.  Flexible sigmoidoscopy or colonoscopy. You may have a sigmoidoscopy every 5 years or a colonoscopy every 10 years starting at age 52.  Hepatitis C blood test.  Hepatitis B blood test.  Sexually transmitted disease (STD) testing.  Diabetes screening. This is done by checking your blood sugar (glucose) after you have not eaten for a while (fasting). You may have this done every 1-3 years.  Bone density scan. This is done to screen for osteoporosis. You may have this done starting at age 80.  Mammogram. This may be done every 1-2 years. Talk to your health care provider about how often you should have regular mammograms. Talk with your health care provider about your test results, treatment options, and if necessary, the need for more tests. Vaccines  Your health care provider may recommend certain vaccines, such as:  Influenza  vaccine. This is recommended every year.  Tetanus, diphtheria, and acellular pertussis (Tdap, Td) vaccine. You may need a Td booster every 10 years.  Zoster vaccine. You may need  this after age 68.  Pneumococcal 13-valent conjugate (PCV13) vaccine. One dose is recommended after age 60.  Pneumococcal polysaccharide (PPSV23) vaccine. One dose is recommended after age 19. Talk to your health care provider about which screenings and vaccines you need and how often you need them. This information is not intended to replace advice given to you by your health care provider. Make sure you discuss any questions you have with your health care provider. Document Released: 01/27/2015 Document Revised: 09/20/2015 Document Reviewed: 11/01/2014 Elsevier Interactive Patient Education  2017 [place] Prevention in the Home Falls can cause injuries. They can happen to people of all ages. There are many things you can do to make your home safe and to help prevent falls. What can I do on the outside of my home?  Regularly fix the edges of walkways and driveways and fix any cracks.  Remove anything that might make you trip as you walk through a door, such as a raised step or threshold.  Trim any bushes or trees on the path to your home.  Use bright outdoor lighting.  Clear any walking paths of anything that might make someone trip, such as rocks or tools.  Regularly check to see if handrails are loose or broken. Make sure that both sides of any steps have handrails.  Any raised decks and porches should have guardrails on the edges.  Have any leaves, snow, or ice cleared regularly.  Use sand or salt on walking paths during winter.  Clean up any spills in your garage right away. This includes oil or grease spills. What can I do in the bathroom?  Use night lights.  Install grab bars by the toilet and in the tub and shower. Do not use towel bars as grab bars.  Use non-skid mats or decals in the tub or shower.  If you need to sit down in the shower, use a plastic, non-slip stool.  Keep the floor dry. Clean up any water that spills on the floor as soon as it  happens.  Remove soap buildup in the tub or shower regularly.  Attach bath mats securely with double-sided non-slip rug tape.  Do not have throw rugs and other things on the floor that can make you trip. What can I do in the bedroom?  Use night lights.  Make sure that you have a light by your bed that is easy to reach.  Do not use any sheets or blankets that are too big for your bed. They should not hang down onto the floor.  Have a firm chair that has side arms. You can use this for support while you get dressed.  Do not have throw rugs and other things on the floor that can make you trip. What can I do in the kitchen?  Clean up any spills right away.  Avoid walking on wet floors.  Keep items that you use a lot in easy-to-reach places.  If you need to reach something above you, use a strong step stool that has a grab bar.  Keep electrical cords out of the way.  Do not use floor polish or wax that makes floors slippery. If you must use wax, use non-skid floor wax.  Do not have throw rugs and other things on the floor that can  make you trip. What can I do with my stairs?  Do not leave any items on the stairs.  Make sure that there are handrails on both sides of the stairs and use them. Fix handrails that are broken or loose. Make sure that handrails are as long as the stairways.  Check any carpeting to make sure that it is firmly attached to the stairs. Fix any carpet that is loose or worn.  Avoid having throw rugs at the top or bottom of the stairs. If you do have throw rugs, attach them to the floor with carpet tape.  Make sure that you have a light switch at the top of the stairs and the bottom of the stairs. If you do not have them, ask someone to add them for you. What else can I do to help prevent falls?  Wear shoes that:  Do not have high heels.  Have rubber bottoms.  Are comfortable and fit you well.  Are closed at the toe. Do not wear sandals.  If you  use a stepladder:  Make sure that it is fully opened. Do not climb a closed stepladder.  Make sure that both sides of the stepladder are locked into place.  Ask someone to hold it for you, if possible.  Clearly mark and make sure that you can see:  Any grab bars or handrails.  First and last steps.  Where the edge of each step is.  Use tools that help you move around (mobility aids) if they are needed. These include:  Canes.  Walkers.  Scooters.  Crutches.  Turn on the lights when you go into a dark area. Replace any light bulbs as soon as they burn out.  Set up your furniture so you have a clear path. Avoid moving your furniture around.  If any of your floors are uneven, fix them.  If there are any pets around you, be aware of where they are.  Review your medicines with your doctor. Some medicines can make you feel dizzy. This can increase your chance of falling. Ask your doctor what other things that you can do to help prevent falls. This information is not intended to replace advice given to you by your health care provider. Make sure you discuss any questions you have with your health care provider. Document Released: 10/27/2008 Document Revised: 06/08/2015 Document Reviewed: 02/04/2014 Elsevier Interactive Patient Education  2017 [employer].

## 2020-05-25 NOTE — Addendum Note (Signed)
Addended by: Kellie Simmering on: 05/25/2020 09:18 AM   Modules accepted: Orders

## 2020-05-25 NOTE — Patient Instructions (Signed)

## 2020-05-26 ENCOUNTER — Telehealth: Payer: Self-pay

## 2020-05-26 LAB — CMP14+EGFR
ALT: 11 IU/L (ref 0–32)
AST: 15 IU/L (ref 0–40)
Albumin/Globulin Ratio: 1.3 (ref 1.2–2.2)
Albumin: 3.9 g/dL (ref 3.7–4.7)
Alkaline Phosphatase: 112 IU/L (ref 44–121)
BUN/Creatinine Ratio: 15 (ref 12–28)
BUN: 13 mg/dL (ref 8–27)
Bilirubin Total: 0.5 mg/dL (ref 0.0–1.2)
CO2: 23 mmol/L (ref 20–29)
Calcium: 8.9 mg/dL (ref 8.7–10.3)
Chloride: 105 mmol/L (ref 96–106)
Creatinine, Ser: 0.89 mg/dL (ref 0.57–1.00)
Globulin, Total: 2.9 g/dL (ref 1.5–4.5)
Glucose: 114 mg/dL — ABNORMAL HIGH (ref 65–99)
Potassium: 4.6 mmol/L (ref 3.5–5.2)
Sodium: 141 mmol/L (ref 134–144)
Total Protein: 6.8 g/dL (ref 6.0–8.5)
eGFR: 68 mL/min/{1.73_m2} (ref >59)

## 2020-05-26 LAB — LIPID PANEL
Chol/HDL Ratio: 3.4 ratio (ref 0.0–4.4)
Cholesterol, Total: 145 mg/dL (ref 100–199)
HDL: 43 mg/dL (ref >39)
LDL Chol Calc (NIH): 87 mg/dL (ref 0–99)
Triglycerides: 76 mg/dL (ref 0–149)
VLDL Cholesterol Cal: 15 mg/dL (ref 5–40)

## 2020-05-26 LAB — TSH: TSH: 0.709 u[IU]/mL (ref 0.450–4.500)

## 2020-05-26 LAB — HEMOGLOBIN A1C
Est. average glucose Bld gHb Est-mCnc: 126 mg/dL
Hgb A1c MFr Bld: 6 % — ABNORMAL HIGH (ref 4.8–5.6)

## 2020-05-26 LAB — T4, FREE: Free T4: 1.87 ng/dL — ABNORMAL HIGH (ref 0.82–1.77)

## 2020-05-26 NOTE — Telephone Encounter (Signed)
-----   Message from Glendale Chard, MD sent at 05/26/2020 10:13 AM EDT ----- Your hba1c is 6.0, this is in prediabetes range. Be sure to limit your intake of sweetened beverages, including diet drinks. Your TSH is slightly decreased. You should take levothyroxine 162mcg M-Sat and 1/2 tab on Sundays. We need to recheck thyroid level in 8 weeks. Someone will contact you for lab visit (TSH dx: e03.9)Your liver/kidney function is stable.   Your cholesterol has improved! Keep up the great work. Be sure to aim for at least 150 minutes of exercise per week. Limit your fried food intake.

## 2020-05-26 NOTE — Telephone Encounter (Signed)
Left the patient a message to call back for lab results and to schedule her a lab appt.

## 2020-05-30 ENCOUNTER — Encounter: Payer: Self-pay | Admitting: Internal Medicine

## 2020-06-15 ENCOUNTER — Other Ambulatory Visit: Payer: Self-pay

## 2020-06-15 DIAGNOSIS — I519 Heart disease, unspecified: Secondary | ICD-10-CM

## 2020-07-20 ENCOUNTER — Other Ambulatory Visit: Payer: Self-pay

## 2020-07-20 ENCOUNTER — Other Ambulatory Visit: Payer: Medicare Other

## 2020-07-20 DIAGNOSIS — E039 Hypothyroidism, unspecified: Secondary | ICD-10-CM

## 2020-07-20 DIAGNOSIS — I519 Heart disease, unspecified: Secondary | ICD-10-CM

## 2020-07-21 LAB — TSH: TSH: 0.253 u[IU]/mL — ABNORMAL LOW (ref 0.450–4.500)

## 2020-07-22 ENCOUNTER — Encounter: Payer: Self-pay | Admitting: Internal Medicine

## 2020-08-02 ENCOUNTER — Other Ambulatory Visit: Payer: Self-pay | Admitting: Internal Medicine

## 2020-10-02 ENCOUNTER — Other Ambulatory Visit: Payer: Self-pay

## 2020-10-02 ENCOUNTER — Encounter: Payer: Self-pay | Admitting: Internal Medicine

## 2020-10-02 ENCOUNTER — Ambulatory Visit: Payer: Medicare Other | Admitting: Internal Medicine

## 2020-10-02 VITALS — Temp 98.9°F | Ht 61.0 in | Wt 195.8 lb

## 2020-10-02 DIAGNOSIS — Z8616 Personal history of COVID-19: Secondary | ICD-10-CM

## 2020-10-02 DIAGNOSIS — E039 Hypothyroidism, unspecified: Secondary | ICD-10-CM

## 2020-10-02 DIAGNOSIS — Z6837 Body mass index (BMI) 37.0-37.9, adult: Secondary | ICD-10-CM

## 2020-10-02 DIAGNOSIS — R42 Dizziness and giddiness: Secondary | ICD-10-CM

## 2020-10-02 DIAGNOSIS — I10 Essential (primary) hypertension: Secondary | ICD-10-CM | POA: Diagnosis not present

## 2020-10-02 DIAGNOSIS — R7309 Other abnormal glucose: Secondary | ICD-10-CM

## 2020-10-02 DIAGNOSIS — Z2821 Immunization not carried out because of patient refusal: Secondary | ICD-10-CM

## 2020-10-02 MED ORDER — MECLIZINE HCL 12.5 MG PO TABS: 12.5000 mg | ORAL_TABLET | Freq: Three times a day (TID) | ORAL | 0 refills | Status: DC | PRN

## 2020-10-02 NOTE — Progress Notes (Signed)
I,Joanne Reese,acting as a Education administrator for [name], MD.,have documented all relevant documentation on the behalf of [name], MD,as directed by  [name], MD while in the presence of [name], MD.  This visit occurred during the SARS-CoV-2 public health emergency.  Safety protocols were in place, including screening questions prior to the visit, additional usage of staff PPE, and extensive cleaning of exam room while observing appropriate contact time as indicated for disinfecting solutions.  Subjective:     Patient ID: Joanne Reese , female    DOB: 05/06/1945 , 75 y.o.   MRN: [id number]   Chief Complaint  Patient presents with   Dizziness    HPI  She presents for further evaluation of dizziness. States her sx started about a month ago, shortly after her second bout with COVID. She reports her sx are exacerbated by positional changes - lying down at night and getting up out of the bed. She does feel that the room is spinning. NO prior h/o vertigo. No associated chest pain, visual disturbances and palpitations.   Dizziness This is a new problem. The current episode started 1 to 4 weeks ago. The problem occurs intermittently. The problem has been unchanged. Associated symptoms include vertigo. Pertinent negatives include no headaches, nausea, visual change or vomiting. Exacerbated by: patient has an episode when shes gets up or lays down. She has tried nothing for the symptoms.    Past Medical History:  Diagnosis Date   Arthritis    Depression    GERD (gastroesophageal reflux disease)    occ   Hypothyroidism      Family History  Problem Relation Age of Onset   Emphysema Father      Current Outpatient Medications:    acyclovir (ZOVIRAX) 400 MG tablet, Take 400 mg by mouth as needed (for outbreaks). 1 tablet BID as needed., Disp: , Rfl:    acyclovir cream (ZOVIRAX) 5 %, Apply 1 application topically as needed., Disp: , Rfl:    amLODipine  (NORVASC) 2.5 MG tablet, TAKE 1 TABLET BY MOUTH EVERY DAY, Disp: 90 tablet, Rfl: 1   aspirin EC 81 MG tablet, Take 81 mg by mouth daily. Swallow whole., Disp: , Rfl:    Biotin 10000 MCG TABS, Take by mouth. daily, Disp: , Rfl:    Cholecalciferol (VITAMIN D3) 50 MCG (2000 UT) capsule, Take 1 capsule by mouth daily. , Disp: , Rfl:    levothyroxine (SYNTHROID) 112 MCG tablet, Take 1 tablet (112 mcg total) by mouth daily before breakfast., Disp: 90 tablet, Rfl: 1   meclizine (ANTIVERT) 12.5 MG tablet, Take 1 tablet (12.5 mg total) by mouth 3 (three) times daily as needed for dizziness., Disp: 30 tablet, Rfl: 0   simvastatin (ZOCOR) 20 MG tablet, TAKE 1 TABLET BY MOUTH IN THE EVENING, Disp: 90 tablet, Rfl: 2   Allergies  Allergen Reactions   Nitrofurantoin Anaphylaxis   Pneumococcal Vaccines Other (See Comments)    LOCAL REACTION Redness & Pain around injection site  Pneumoxvac 23 (active) per patient report     Review of Systems  Constitutional: Negative.   Respiratory: Negative.    Cardiovascular: Negative.   Gastrointestinal: Negative.  Negative for nausea and vomiting.  Neurological:  Positive for dizziness and vertigo. Negative for headaches.  Psychiatric/Behavioral: Negative.      Today's Vitals   10/02/20 1435  Temp: 98.9 F (37.2 C)  Weight: 195 lb 12.8 oz (88.8 kg)  Height: '5\' 1"'  (1.549 m)  PainSc: 0-No pain   Body mass index is 37 kg/m.   Objective:  Physical Exam Vitals and nursing note reviewed.  Constitutional:      Appearance: Normal appearance.  HENT:     Head: Normocephalic and atraumatic.     Right Ear: Tympanic membrane, ear canal and external ear normal. There is no impacted cerumen.     Left Ear: Tympanic membrane, ear canal and external ear normal. There is no impacted cerumen.     Nose:     Comments: Masked     Mouth/Throat:     Comments: Masked Eyes:     Extraocular Movements: Extraocular movements intact.  Cardiovascular:     Rate and Rhythm:  Normal rate and regular rhythm.     Heart sounds: Normal heart sounds.  Pulmonary:     Effort: Pulmonary effort is normal.     Breath sounds: Normal breath sounds.  Musculoskeletal:     Cervical back: Normal range of motion.  Skin:    General: Skin is warm.  Neurological:     General: No focal deficit present.     Mental Status: She is alert.  Psychiatric:        Mood and Affect: Mood normal.        Behavior: Behavior normal.        Assessment And Plan:     1. Dizziness Comments: Her sx are suggestive of vertigo. Pt advised her sx may last another week or two. I will send rx meclizine 12.75m po tid prn. Will contact me if her sx persist. - CBC no Diff  2. Essential hypertension, benign Comments: Chronic, fair control. Encouraged to follow low sodium diet. I will check renal function today.  - BMP8+eGFR  3. Primary hypothyroidism Comments: She wants to have her thyroid checked today to see if her labs have improved. I will check thyroid panel today.  - TSH - T4, Free  4. Other abnormal glucose Comments: Her a1c has been elevated in the past. I will recheck an a1c today. She is encouraged to limit her intake of sweetened beverages, including diet drinks.  - Hemoglobin A1c  5. Class 2 severe obesity due to excess calories with serious comorbidity and body mass index (BMI) of 37.0 to 37.9 in adult ([facility] Comments: She is encouraged to strive for BMI less than 30 to decrease cardiac risk. Advised to aim for at least 150 minutes of exercise per week.   6. Influenza vaccination declined Comments: She wants to wait until October 2022 to get her flu vaccine.   7. Personal history of COVID-19 Comments: Recently had COVID for the second time July 2022. She is fully vaccinated and boosted x 2.     Patient was given opportunity to ask questions. Patient verbalized understanding of the plan and was able to repeat key elements of the plan. All questions were answered to their  satisfaction.   I, RMaximino Greenland MD, have reviewed all documentation for this visit. The documentation on 10/08/20 for the exam, diagnosis, procedures, and orders are all accurate and complete.   IF YOU HAVE BEEN REFERRED TO A SPECIALIST, IT MAY TAKE 1-2 WEEKS TO SCHEDULE/PROCESS THE REFERRAL. IF YOU HAVE NOT HEARD FROM US/SPECIALIST IN TWO WEEKS, PLEASE GIVE [place] CALL AT [phone].   THE PATIENT IS ENCOURAGED TO PRACTICE SOCIAL DISTANCING DUE TO THE COVID-19 PANDEMIC.

## 2020-10-03 LAB — BMP8+EGFR
BUN/Creatinine Ratio: 16 (ref 12–28)
BUN: 12 mg/dL (ref 8–27)
CO2: 21 mmol/L (ref 20–29)
Calcium: 8.4 mg/dL — ABNORMAL LOW (ref 8.7–10.3)
Chloride: 105 mmol/L (ref 96–106)
Creatinine, Ser: 0.76 mg/dL (ref 0.57–1.00)
Glucose: 107 mg/dL — ABNORMAL HIGH (ref 65–99)
Potassium: 4.1 mmol/L (ref 3.5–5.2)
Sodium: 142 mmol/L (ref 134–144)
eGFR: 83 mL/min/{1.73_m2} (ref >59)

## 2020-10-03 LAB — CBC
Hematocrit: 40.3 % (ref 34.0–46.6)
Hemoglobin: 12.4 g/dL (ref 11.1–15.9)
MCH: 24.1 pg — ABNORMAL LOW (ref 26.6–33.0)
MCHC: 30.8 g/dL — ABNORMAL LOW (ref 31.5–35.7)
MCV: 78 fL — ABNORMAL LOW (ref 79–97)
Platelets: 252 10*3/uL (ref 150–450)
RBC: 5.15 x10E6/uL (ref 3.77–5.28)
RDW: 16.6 % — ABNORMAL HIGH (ref 11.7–15.4)
WBC: 8.1 10*3/uL (ref 3.4–10.8)

## 2020-10-03 LAB — TSH: TSH: 1.35 u[IU]/mL (ref 0.450–4.500)

## 2020-10-03 LAB — HEMOGLOBIN A1C
Est. average glucose Bld gHb Est-mCnc: 131 mg/dL
Hgb A1c MFr Bld: 6.2 % — ABNORMAL HIGH (ref 4.8–5.6)

## 2020-10-03 LAB — T4, FREE: Free T4: 1.48 ng/dL (ref 0.82–1.77)

## 2020-10-05 ENCOUNTER — Encounter: Payer: Self-pay | Admitting: Internal Medicine

## 2020-10-05 LAB — FERRITIN: Ferritin: 15 ng/mL (ref 15–150)

## 2020-10-05 LAB — IRON AND TIBC
Iron Saturation: 9 % — CL (ref 15–55)
Iron: 40 ug/dL (ref 27–139)
Total Iron Binding Capacity: 465 ug/dL — ABNORMAL HIGH (ref 250–450)
UIBC: 425 ug/dL — ABNORMAL HIGH (ref 118–369)

## 2020-10-05 LAB — SPECIMEN STATUS REPORT

## 2020-11-01 ENCOUNTER — Other Ambulatory Visit: Payer: Self-pay | Admitting: Internal Medicine

## 2020-11-27 ENCOUNTER — Ambulatory Visit: Payer: 59 | Admitting: Internal Medicine

## 2021-01-03 ENCOUNTER — Encounter: Payer: Self-pay | Admitting: Internal Medicine

## 2021-01-03 ENCOUNTER — Other Ambulatory Visit: Payer: Self-pay

## 2021-01-03 ENCOUNTER — Ambulatory Visit: Payer: Medicare Other | Admitting: Internal Medicine

## 2021-01-03 VITALS — BP 130/78 | HR 75 | Temp 98.1°F | Ht 60.0 in | Wt 199.0 lb

## 2021-01-03 DIAGNOSIS — D509 Iron deficiency anemia, unspecified: Secondary | ICD-10-CM | POA: Diagnosis not present

## 2021-01-03 DIAGNOSIS — I700 Atherosclerosis of aorta: Secondary | ICD-10-CM

## 2021-01-03 DIAGNOSIS — R7309 Other abnormal glucose: Secondary | ICD-10-CM

## 2021-01-03 DIAGNOSIS — I10 Essential (primary) hypertension: Secondary | ICD-10-CM

## 2021-01-03 DIAGNOSIS — F4321 Adjustment disorder with depressed mood: Secondary | ICD-10-CM

## 2021-01-03 DIAGNOSIS — Z6838 Body mass index (BMI) 38.0-38.9, adult: Secondary | ICD-10-CM

## 2021-01-03 DIAGNOSIS — Z2821 Immunization not carried out because of patient refusal: Secondary | ICD-10-CM

## 2021-01-03 MED ORDER — SERTRALINE HCL 25 MG PO TABS: ORAL_TABLET | ORAL | 0 refills | Status: DC

## 2021-01-03 NOTE — Progress Notes (Signed)
I,Katawbba Wiggins,acting as a Education administrator for [name], MD.,have documented all relevant documentation on the behalf of [name], MD,as directed by  [name], MD while in the presence of [name], MD.  This visit occurred during the SARS-CoV-2 public health emergency.  Safety protocols were in place, including screening questions prior to the visit, additional usage of staff PPE, and extensive cleaning of exam room while observing appropriate contact time as indicated for disinfecting solutions.  Subjective:     Patient ID: Joanne Reese , female    DOB: [birth year] , 75 y.o.   MRN: [id number]   Chief Complaint  Patient presents with   Hypertension    HPI  She is here today for bp F/U. She reports compliance with meds. She denies headaches, chest pain and shortness of breath.   She adds that she is still grieving the death of her husband. She is interested in starting Zoloft to help address her sx of depression. She has no desire to harm herself or others.   Hypertension This is a chronic problem. The current episode started more than 1 year ago. The problem has been rapidly improving since onset. The problem is controlled. Risk factors for coronary artery disease include dyslipidemia and post-menopausal state. Past treatments include calcium channel blockers. The current treatment provides moderate improvement.    Past Medical History:  Diagnosis Date   Arthritis    Depression    GERD (gastroesophageal reflux disease)    occ   Hypothyroidism      Family History  Problem Relation Age of Onset   Emphysema Father      Current Outpatient Medications:    acyclovir (ZOVIRAX) 400 MG tablet, Take 400 mg by mouth as needed (for outbreaks). 1 tablet BID as needed., Disp: , Rfl:    acyclovir cream (ZOVIRAX) 5 %, Apply 1 application topically as needed., Disp: , Rfl:    amLODipine (NORVASC) 2.5 MG tablet, TAKE 1 TABLET BY MOUTH EVERY DAY, Disp: 90 tablet,  Rfl: 1   aspirin EC 81 MG tablet, Take 81 mg by mouth daily. Swallow whole., Disp: , Rfl:    Biotin 10000 MCG TABS, Take by mouth. daily, Disp: , Rfl:    Cholecalciferol (VITAMIN D3) 50 MCG (2000 UT) capsule, Take 1 capsule by mouth daily. , Disp: , Rfl:    sertraline (ZOLOFT) 25 MG tablet, Take one tab po qd x 7 days, then 2 tabs po qpm, Disp: 60 tablet, Rfl: 0   simvastatin (ZOCOR) 20 MG tablet, TAKE 1 TABLET BY MOUTH EVERY DAY IN THE EVENING, Disp: 90 tablet, Rfl: 2   levothyroxine (SYNTHROID) 112 MCG tablet, Take 1 tablet (112 mcg total) by mouth daily before breakfast. (Patient taking differently: Take 112 mcg by mouth daily before breakfast. 1 pill Monday - Saturday, 1/2 tab on Sunday), Disp: 90 tablet, Rfl: 1   Allergies  Allergen Reactions   Nitrofurantoin Anaphylaxis   Pneumococcal Vaccines Other (See Comments)    LOCAL REACTION Redness & Pain around injection site  Pneumoxvac 23 (active) per patient report     Review of Systems  Constitutional: Negative.   Respiratory: Negative.    Cardiovascular: Negative.   Gastrointestinal: Negative.   Psychiatric/Behavioral:  Positive for dysphoric mood.   All other systems reviewed and are negative.   Today's Vitals   01/03/21 1058  BP: 130/78  Pulse: 75  Temp: 98.1 F (36.7 C)  Weight: 199 lb (90.3 kg)  Height: 5' (1.524 m)  PainSc: 0-No pain   Body mass index is 38.86 kg/m.  Wt Readings from Last 3 Encounters:  01/03/21 199 lb (90.3 kg)  10/02/20 195 lb 12.8 oz (88.8 kg)  05/25/20 195 lb (88.5 kg)    BP Readings from Last 3 Encounters:  01/03/21 130/78  05/25/20 130/80  05/25/20 130/80    Objective:  Physical Exam Vitals and nursing note reviewed.  Constitutional:      Appearance: Normal appearance.  HENT:     Head: Normocephalic and atraumatic.     Nose:     Comments: Masked     Mouth/Throat:     Comments: Masked  Eyes:     Extraocular Movements: Extraocular movements intact.  Cardiovascular:     Rate and  Rhythm: Normal rate and regular rhythm.     Heart sounds: Normal heart sounds.  Pulmonary:     Effort: Pulmonary effort is normal.     Breath sounds: Normal breath sounds.  Genitourinary:    Rectum: Normal. Guaiac result negative.  Musculoskeletal:     Cervical back: Normal range of motion.  Skin:    General: Skin is warm.  Neurological:     General: No focal deficit present.     Mental Status: She is alert.  Psychiatric:        Mood and Affect: Mood normal.        Behavior: Behavior normal.        Assessment And Plan:     1. Essential hypertension, benign Comments: Chronic, fair control. Encouraged to resume her regular exercise regimen, aiming for 150 minutes of exercise per week. She will f/u in 4-6 months.  - CMP14+EGFR  2. Adjustment disorder with depressed mood Comments: I will send rx sertraline 68m daily. Possible side effects d/w pt. Advised to take in the evenings.  She agrees to rto in six weeks for re-evaluation.   3. Atherosclerosis of aorta (HCC) Comments: Chronic, importance of statin compliance was discussed with the patient. She is encouraged to follow a heart healthy lifestyle.   4. Other abnormal glucose Comments: Her a1c has been elevated in the past. I will recheck an a1c today. She is encouraged to limit her intake of sweetened beverages.  - Hemoglobin A1c  5. Iron deficiency anemia, unspecified iron deficiency anemia type Comments: I will check CBC and iron panel today. I will make further recommendations once her labs are available for review. Stool is heme negative.  - CBC no Diff - Iron, TIBC and Ferritin Panel - POC Hemoccult Bld/Stl (1-Cd Office Dx)  6. Class 2 severe obesity due to excess calories with serious comorbidity and body mass index (BMI) of 38.0 to 38.9 in adult ([facility] Comments:  She is encouraged to strive for BMI less than 30 to decrease cardiac risk. Advised to aim for at least 150 minutes of exercise per week.   7. Varicella  zoster virus (VZV) vaccination declined  Patient was given opportunity to ask questions. Patient verbalized understanding of the plan and was able to repeat key elements of the plan. All questions were answered to their satisfaction.   I, RMaximino Greenland MD, have reviewed all documentation for this visit. The documentation on 01/03/21 for the exam, diagnosis, procedures, and orders are all accurate and complete.   IF YOU HAVE BEEN REFERRED TO A SPECIALIST, IT MAY TAKE 1-2 WEEKS TO SCHEDULE/PROCESS THE REFERRAL. IF YOU HAVE NOT HEARD FROM US/SPECIALIST IN TWO WEEKS, PLEASE GIVE [place] CALL AT [phone].  THE PATIENT IS ENCOURAGED TO PRACTICE SOCIAL DISTANCING DUE TO THE COVID-19 PANDEMIC.

## 2021-01-03 NOTE — Patient Instructions (Signed)

## 2021-01-04 LAB — CMP14+EGFR
ALT: 15 IU/L (ref 0–32)
AST: 20 IU/L (ref 0–40)
Albumin/Globulin Ratio: 1.4 (ref 1.2–2.2)
Albumin: 4.2 g/dL (ref 3.7–4.7)
Alkaline Phosphatase: 108 IU/L (ref 44–121)
BUN/Creatinine Ratio: 12 (ref 12–28)
BUN: 11 mg/dL (ref 8–27)
Bilirubin Total: 0.4 mg/dL (ref 0.0–1.2)
CO2: 25 mmol/L (ref 20–29)
Calcium: 9 mg/dL (ref 8.7–10.3)
Chloride: 104 mmol/L (ref 96–106)
Creatinine, Ser: 0.89 mg/dL (ref 0.57–1.00)
Globulin, Total: 2.9 g/dL (ref 1.5–4.5)
Glucose: 93 mg/dL (ref 70–99)
Potassium: 4.5 mmol/L (ref 3.5–5.2)
Sodium: 142 mmol/L (ref 134–144)
Total Protein: 7.1 g/dL (ref 6.0–8.5)
eGFR: 68 mL/min/{1.73_m2} (ref >59)

## 2021-01-04 LAB — CBC
Hematocrit: 42.9 % (ref 34.0–46.6)
Hemoglobin: 14.2 g/dL (ref 11.1–15.9)
MCH: 26.6 pg (ref 26.6–33.0)
MCHC: 33.1 g/dL (ref 31.5–35.7)
MCV: 80 fL (ref 79–97)
Platelets: 225 10*3/uL (ref 150–450)
RBC: 5.34 x10E6/uL — ABNORMAL HIGH (ref 3.77–5.28)
RDW: 15.9 % — ABNORMAL HIGH (ref 11.7–15.4)
WBC: 6.2 10*3/uL (ref 3.4–10.8)

## 2021-01-04 LAB — IRON,TIBC AND FERRITIN PANEL
Ferritin: 20 ng/mL (ref 15–150)
Iron Saturation: 17 % (ref 15–55)
Iron: 73 ug/dL (ref 27–139)
Total Iron Binding Capacity: 426 ug/dL (ref 250–450)
UIBC: 353 ug/dL (ref 118–369)

## 2021-01-04 LAB — HEMOGLOBIN A1C
Est. average glucose Bld gHb Est-mCnc: 123 mg/dL
Hgb A1c MFr Bld: 5.9 % — ABNORMAL HIGH (ref 4.8–5.6)

## 2021-01-16 LAB — POC HEMOCCULT BLD/STL (OFFICE/1-CARD/DIAGNOSTIC): Fecal Occult Blood, POC: NEGATIVE

## 2021-01-27 ENCOUNTER — Other Ambulatory Visit: Payer: Self-pay | Admitting: Internal Medicine

## 2021-02-12 ENCOUNTER — Other Ambulatory Visit: Payer: Self-pay | Admitting: Internal Medicine

## 2021-03-07 ENCOUNTER — Ambulatory Visit: Payer: 59 | Admitting: Internal Medicine

## 2021-03-15 ENCOUNTER — Other Ambulatory Visit: Payer: Self-pay | Admitting: Internal Medicine

## 2021-03-16 LAB — HM MAMMOGRAPHY: HM Mammogram: NORMAL (ref 0–4)

## 2021-03-20 ENCOUNTER — Ambulatory Visit: Payer: 59 | Admitting: Internal Medicine

## 2021-03-27 IMAGING — CT CT ANGIO CHEST
2 of 3 series · 17 of 31 positions shown · IV contrast (APPLIED)
Comparison: 10/08/2006

CLINICAL DATA: Shortness of breath, elevated D-dimer

EXAM:
CT ANGIOGRAPHY CHEST WITH CONTRAST
TECHNIQUE: Multidetector CT imaging of the chest was performed using the
standard protocol during bolus administration of intravenous
contrast. Multiplanar CT image reconstructions and MIPs were
obtained to evaluate the vascular anatomy.
CONTRAST:  75mL NSQJR4-MR7 IOPAMIDOL (NSQJR4-MR7) INJECTION 76%

[Series 5: thins 1.0 b31s · axial · 0.76mm/px · z∈[-262,+9]mm · 15 of 313 slices shown]
[im 21/313  lung]
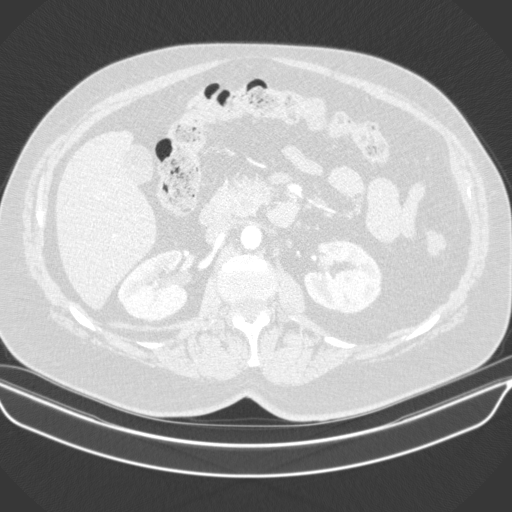
[im 42/313  mediastinal]
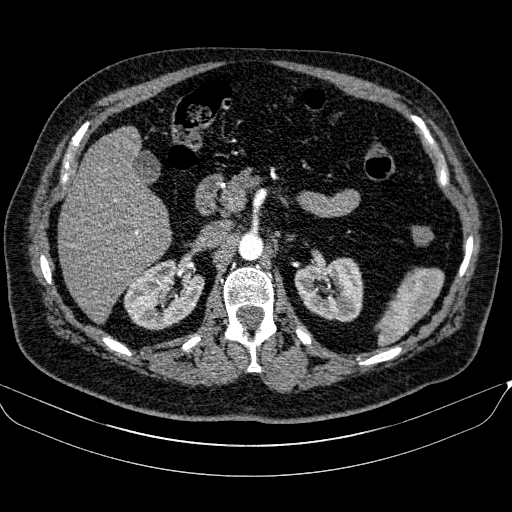
[im 63/313  lung]
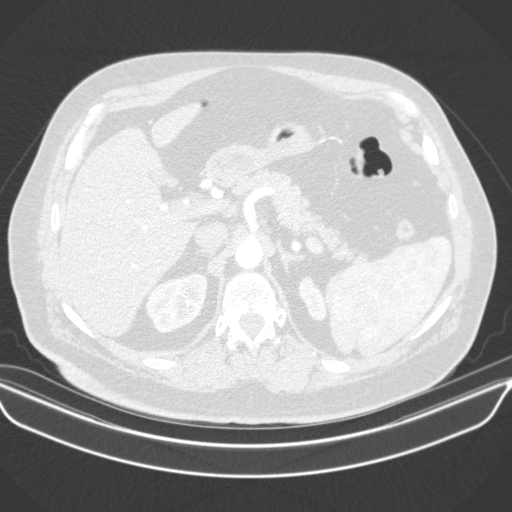
[im 84/313  mediastinal]
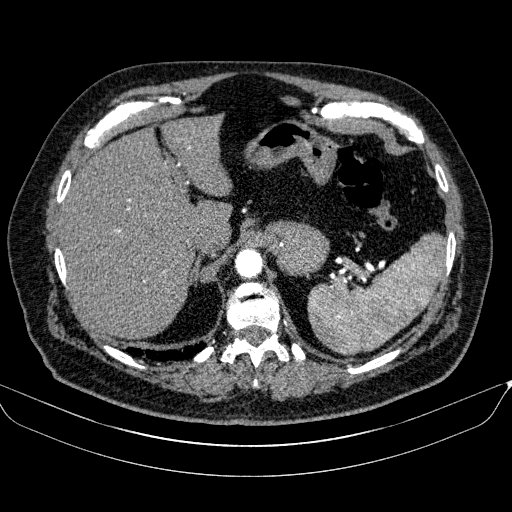
[im 105/313  lung]
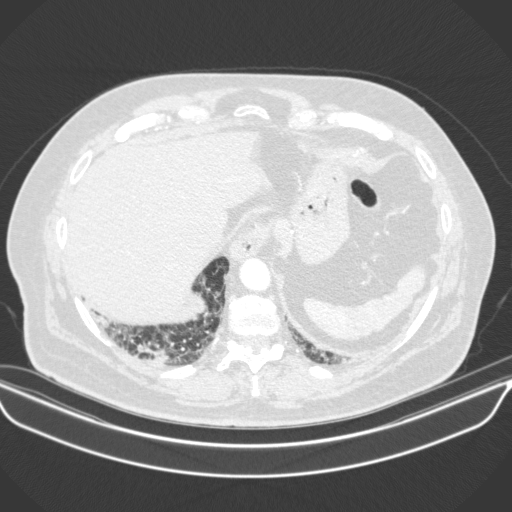
[im 125/313  mediastinal]
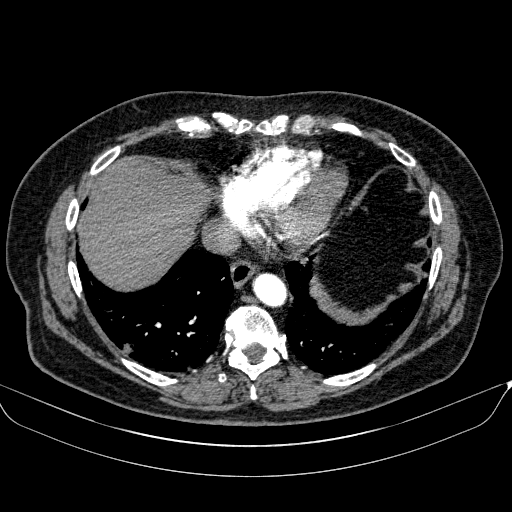
[im 146/313  lung]
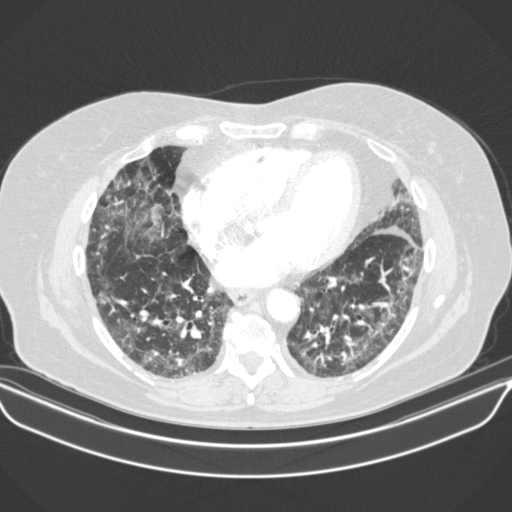
[im 157/313  mediastinal]
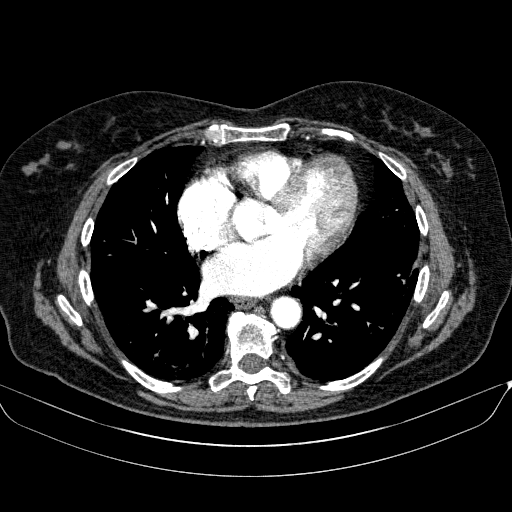
[im 167/313  lung]
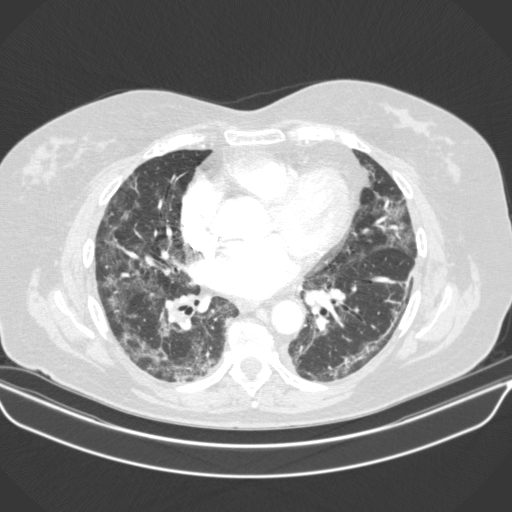
[im 188/313  mediastinal]
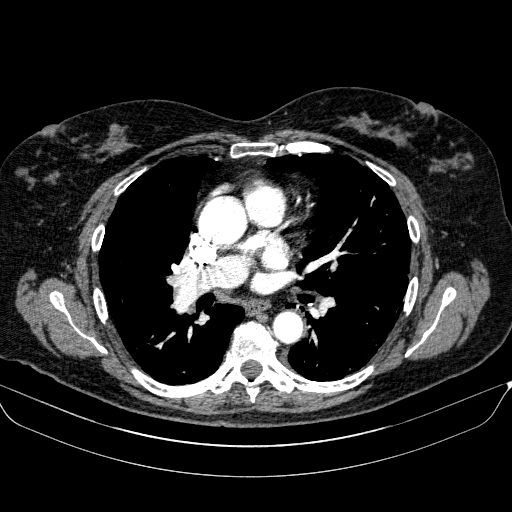
[im 209/313  lung]
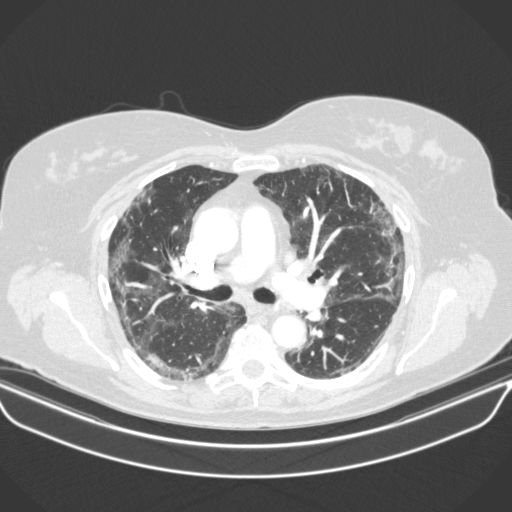
[im 229/313  mediastinal]
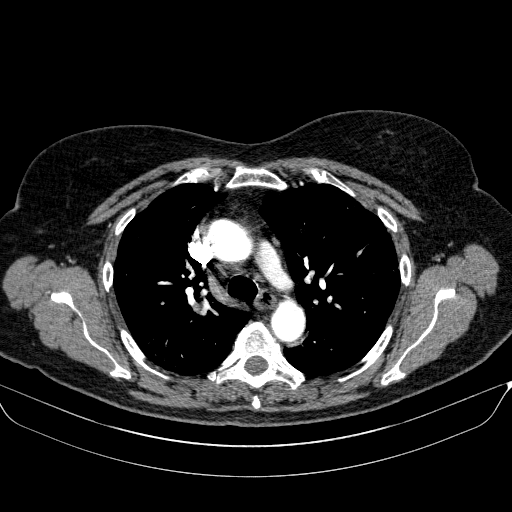
[im 250/313  lung]
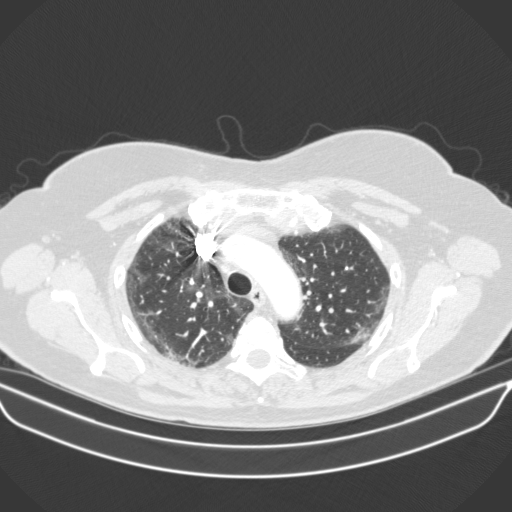
[im 271/313  mediastinal]
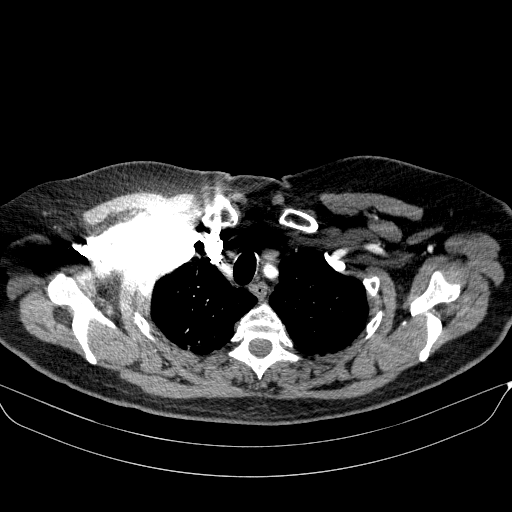
[im 292/313  lung]
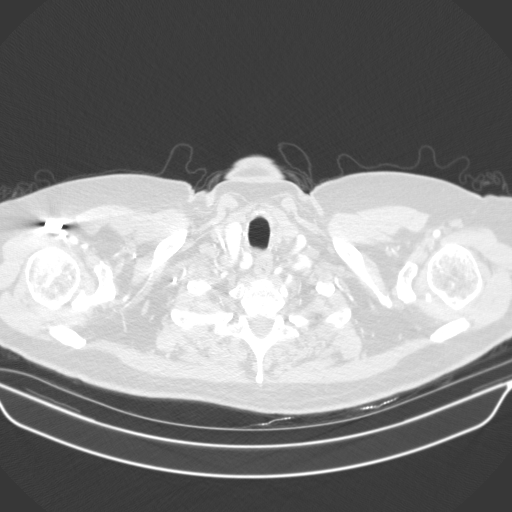

[Series 11: lung · axial · 0.76mm/px · z∈[-166,-136]mm · 2 of 123 slices shown]
[im 25/123  mediastinal]
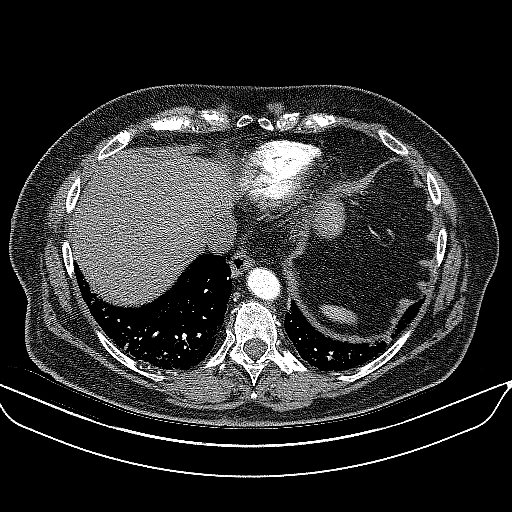
[im 40/123  mediastinal]
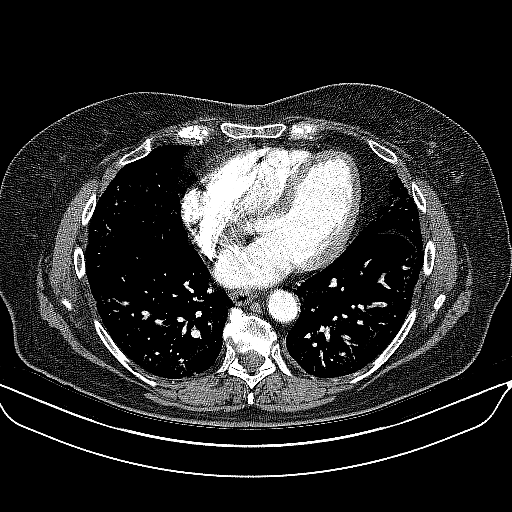

[17 of 31 positions shown; findings below may reference images not displayed]

FINDINGS: Cardiovascular: Satisfactory opacification of the pulmonary arteries
to the segmental level. No evidence of pulmonary embolism. Mild
cardiomegaly. No pericardial effusion.

Mediastinum/Nodes: No enlarged mediastinal, hilar, or axillary lymph
nodes. There is a nonenlarged lymph node or nodule in the anterior
mediastinum measuring 1.2 x 0.5 cm (series 4, image 33), seen on
remote prior examination dated 5771 and benign. Thyroid gland,
trachea, and esophagus demonstrate no significant findings.

Lungs/Pleura: There is extensive peripheral irregular interstitial
opacity, some degree of architectural distortion, and no significant
bronchiectasis or bronchiolectasis appreciated. There is some
evidence of subpleural sparing and a clear apical to basal gradient.
There are multiple bilateral pulmonary nodules, including a 6 mm
nodule of the right pulmonary apex (series 11, image 19), a 1.0 cm
nodule of the right lung base (series 11, image 96), and a 5 mm
nodule of the lingula (series 11, image 69). Lingular nodule is
stable and benign when compared to examination dated 5771. No
pleural effusion or pneumothorax.

Upper Abdomen: No acute abnormality.

Musculoskeletal: No chest wall abnormality. No acute or significant
osseous findings.

Review of the MIP images confirms the above findings.
IMPRESSION: 1.  Negative examination for pulmonary embolism.

2. There is extensive peripheral irregular interstitial opacity,
some degree of architectural distortion, and no significant
bronchiectasis or bronchiolectasis appreciated. There is some
evidence of subpleural sparing and a clear apical to basal gradient.
Findings are consistent with moderate pulmonary fibrosis, possibly
with a degree of superimposed acute infectious or inflammatory
airspace disease, and new when compared to remote prior examination
dated 5771. As seen on this examination, findings are most
consistent with an alternative diagnosis pattern of fibrosis,
leading differential consideration fibrotic NSIP. Consider a
dedicated ILD protocol CT examination of the chest following the
resolution of acute clinical symptoms to further evaluate.

3. There are multiple bilateral pulmonary nodules, including a 6 mm
nodule of the right pulmonary apex (series 11, image 19), a 1.0 cm
nodule of the right lung base (series 11, image 96), as well as
additional smaller, benign nodules stable compared to remote prior
examination dated 5771. Non-contrast chest CT at 3-6 months is
recommended, and could be performed in conjunction with ILD protocol
examination suggested above. If the nodules are stable at time of
repeat CT, then future CT at 18-24 months (from today's scan) is
considered optional for low-risk patients, but is recommended for
high-risk patients. This recommendation follows the consensus
statement: Guidelines for Management of Incidental Pulmonary Nodules
Detected on CT Images: From the [HOSPITAL] 3889; Radiology

## 2021-05-04 ENCOUNTER — Encounter: Payer: Self-pay | Admitting: Internal Medicine

## 2021-05-31 ENCOUNTER — Ambulatory Visit (INDEPENDENT_AMBULATORY_CARE_PROVIDER_SITE_OTHER): Payer: Medicare Other

## 2021-05-31 VITALS — BP 124/66 | HR 83 | Temp 98.0°F | Ht 61.4 in | Wt 199.4 lb

## 2021-05-31 DIAGNOSIS — Z0000 Encounter for general adult medical examination without abnormal findings: Secondary | ICD-10-CM

## 2021-05-31 NOTE — Progress Notes (Signed)
Subjective:   Joanne Reese is a 76 y.o. female who presents for Medicare Annual (Subsequent) preventive examination.  Review of Systems     Cardiac Risk Factors include: advanced age (>18mn, >>84women);obesity (BMI >30kg/m2)     Objective:    Today's Vitals   05/31/21 1116  BP: 124/66  Pulse: 83  Temp: 98 F (36.7 C)  TempSrc: Oral  SpO2: 94%  Weight: 199 lb 6.4 oz (90.4 kg)  Height: 5' 1.4" (1.56 m)   Body mass index is 37.19 kg/m.     05/31/2021   11:24 AM 05/25/2020    8:41 AM 05/19/2019    2:57 PM 04/30/2018    8:54 AM 02/10/2017    8:39 AM 01/28/2017    9:01 AM  Advanced Directives  Does Patient Have a Medical Advance Directive? Yes Yes Yes Yes No Yes  Type of AParamedicof [place]angLiving will HPleasant PlainsLiving will [place]rock HallLiving will [place]ouble SpringsLiving will    Copy of [place] Chart? No - copy requested No - copy requested No - copy requested No - copy requested    Would patient like information on creating a medical advance directive?     No - Patient declined     Current Medications (verified) Outpatient Encounter Medications as of 05/31/2021  Medication Sig   acyclovir (ZOVIRAX) 400 MG tablet Take 400 mg by mouth as needed (for outbreaks). 1 tablet BID as needed.   acyclovir cream (ZOVIRAX) 5 % Apply 1 application topically as needed.   amLODipine (NORVASC) 2.5 MG tablet TAKE 1 TABLET BY MOUTH EVERY DAY   aspirin EC 81 MG tablet Take 81 mg by mouth daily. Swallow whole.   Biotin 10000 MCG TABS Take by mouth. daily   Cholecalciferol (VITAMIN D3) 50 MCG (2000 UT) capsule Take 1 capsule by mouth daily.    levothyroxine (SYNTHROID) 112 MCG tablet Take 1 tablet (112 mcg total) by mouth daily before breakfast. (Patient taking differently: Take 112 mcg by mouth daily before breakfast. 1 pill Monday - Saturday, 1/2 tab on Sunday)   simvastatin (ZOCOR) 20 MG  tablet TAKE 1 TABLET BY MOUTH EVERY DAY IN THE EVENING   sertraline (ZOLOFT) 25 MG tablet TAKE 1 TABLET BY MOUTH EVERY DAY FOR 7 DAYS, THEN 2 TABLETS EVERY EVENING (Patient not taking: Reported on 05/31/2021)   No facility-administered encounter medications on file as of 05/31/2021.    Allergies (verified) Nitrofurantoin and Pneumococcal vaccines   History: Past Medical History:  Diagnosis Date   Arthritis    Depression    GERD (gastroesophageal reflux disease)    occ   Hypothyroidism    Past Surgical History:  Procedure Laterality Date   CYST EXCISION     ovary  30+ yrs   PARTIAL KNEE ARTHROPLASTY Right 02/10/2017   Procedure: RIGHT UNICOMPARTMENTAL KNEE;  Surgeon: LVickey Huger MD;  [facility]  Service: Orthopedics;  Laterality: Right;   THYROID LOBECTOMY     Family History  Problem Relation Age of Onset   Emphysema Father    Social History   Socioeconomic History   Marital status: Widowed    Spouse name: Not on file   Number of children: 4   Years of education: Not on file   Highest education level: Not on file  Occupational History   Occupation: retired  Tobacco Use   Smoking status: Never   Smokeless tobacco: Never  Vaping Use  Vaping Use: Never used  Substance and Sexual Activity   Alcohol use: Not Currently    Comment: occasional   Drug use: No   Sexual activity: Not Currently  Other Topics Concern   Not on file  Social History Narrative   Not on file   Social Determinants of Health   Financial Resource Strain: Low Risk    Difficulty of Paying Living Expenses: Not hard at all  Food Insecurity: No Food Insecurity   Worried About Charity fundraiser in the Last Year: Never true   [place] in the Last Year: Never true  Transportation Needs: No Transportation Needs   Lack of Transportation (Medical): No   Lack of Transportation (Non-Medical): No  Physical Activity: Inactive   Days of Exercise per Week: 0 days   Minutes of Exercise per  Session: 0 min  Stress: No Stress Concern Present   Feeling of Stress : Not at all  Social Connections: Not on file    Tobacco Counseling Counseling given: Not Answered   Clinical Intake:  Pre-visit preparation completed: Yes  Pain : No/denies pain     Nutritional Status: BMI > 30  Obese Nutritional Risks: None Diabetes: No  How often do you need to have someone help you when you read instructions, pamphlets, or other written materials from your doctor or pharmacy?: 1 - Never  Diabetic? no  Interpreter Needed?: No  Information entered by :: NAllen LPN   Activities of Daily Living    05/31/2021   11:24 AM  In your present state of health, do you have any difficulty performing the following activities:  Hearing? 0  Vision? 0  Difficulty concentrating or making decisions? 0  Walking or climbing stairs? 1  Dressing or bathing? 0  Doing errands, shopping? 0  Preparing Food and eating ? N  Using the Toilet? N  In the past six months, have you accidently leaked urine? N  Do you have problems with loss of bowel control? N  Managing your Medications? N  Managing your Finances? N  Housekeeping or managing your Housekeeping? N    Patient Care Team: Glendale Chard, MD as PCP - General (Internal Medicine) Warden Fillers, MD as Consulting Physician (Ophthalmology)  Indicate any recent Medical Services you may have received from other than [facility] providers in the past year (date may be approximate).     Assessment:   This is a routine wellness examination for [name].  Hearing/Vision screen Vision Screening - Comments:: Regular eye exams, [facility]  Dietary issues and exercise activities discussed: Current Exercise Habits: The patient does not participate in regular exercise at present   Goals Addressed             This Visit's Progress    Patient Stated       05/31/2021, no goals       Depression Screen    05/31/2021   11:24 AM  01/03/2021   11:18 AM 01/03/2021   10:55 AM 10/02/2020    2:33 PM 05/25/2020    9:05 AM 05/25/2020    8:41 AM 12/27/2019    3:10 PM  PHQ 2/9 Scores  PHQ - 2 Score 0 '2 1 1 '$ 0 0 2  PHQ- 9 Score  '8 2 2 '$ 0  5    Fall Risk    05/31/2021   11:24 AM 05/25/2020    8:41 AM 05/19/2019    2:57 PM 05/19/2019    2:22 PM 04/30/2018  8:55 AM  Fall Risk   Falls in the past year? 0 0 0 0 0  Number falls in past yr: 0      Injury with Fall? 0      Risk for fall due to : Medication side effect Medication side effect     Follow up Falls evaluation completed;Education provided;Falls prevention discussed Falls evaluation completed;Education provided;Falls prevention discussed Falls evaluation completed;Education provided;Falls prevention discussed  Education provided;Falls prevention discussed    FALL RISK PREVENTION PERTAINING TO THE HOME:  Any stairs in or around the home? Yes  If so, are there any without handrails? No  Home free of loose throw rugs in walkways, pet beds, electrical cords, etc? Yes  Adequate lighting in your home to reduce risk of falls? Yes   ASSISTIVE DEVICES UTILIZED TO PREVENT FALLS:  Life alert? No  Use of a cane, walker or w/c? No  Grab bars in the bathroom? No  Shower chair or bench in shower? No  Elevated toilet seat or a handicapped toilet? No   TIMED UP AND GO:  Was the test performed? No .    Gait steady and fast without use of assistive device  Cognitive Function:        05/31/2021   11:25 AM 05/25/2020    8:42 AM 05/19/2019    2:59 PM 04/30/2018    8:57 AM  6CIT Screen  What Year? 0 points 0 points 0 points 0 points  What month? 0 points 0 points 0 points 0 points  What time? 0 points 0 points 0 points 0 points  Count back from 20 0 points 0 points 0 points 0 points  Months in reverse 0 points 0 points 0 points 0 points  Repeat phrase 2 points 2 points 0 points 0 points  Total Score 2 points 2 points 0 points 0 points    Immunizations Immunization  History  Administered Date(s) Administered   Fluad Quad(high Dose 65+) 09/30/2018, 09/23/2019   Influenza Split 10/14/2017   Influenza, High Dose Seasonal PF 10/26/2020   Moderna Covid-19 Vaccine Bivalent Booster 15yr & up 11/01/2020   Moderna Sars-Covid-2 Vaccination 02/25/2019, 03/22/2019, 11/16/2019, 05/16/2020   Pneumococcal Polysaccharide-23 06/05/2012    TDAP status: Up to date  Flu Vaccine status: Up to date  Pneumococcal vaccine status: Up to date  Covid-19 vaccine status: Completed vaccines  Qualifies for Shingles Vaccine? Yes   Zostavax completed Yes   Shingrix Completed?: No.    Education has been provided regarding the importance of this vaccine. Patient has been advised to call insurance company to determine out of pocket expense if they have not yet received this vaccine. Advised may also receive vaccine at local pharmacy or Health Dept. Verbalized acceptance and understanding.  Screening Tests Health Maintenance  Topic Date Due   Zoster Vaccines- Shingrix (1 of 2) 08/31/2021 (Originally 03/06/1964)   Pneumonia Vaccine 74 Years old (2 - PCV) 01/03/2022 (Originally 06/05/2013)   INFLUENZA VACCINE  08/14/2021   MAMMOGRAM  03/06/2022   TETANUS/TDAP  11/27/2022   COLONOSCOPY (Pts 45-44yrInsurance coverage will need to be confirmed)  02/03/2023   DEXA SCAN  Completed   COVID-19 Vaccine  Completed   Hepatitis C Screening  Completed   HPV VACCINES  Aged Out    Health Maintenance  There are no preventive care reminders to display for this patient.   Colorectal cancer screening: scheduled for June  Mammogram status: Completed 2023. Repeat every year  Bone Density status: Completed 03/29/2016.  Lung Cancer Screening: (Low Dose CT Chest recommended if Age 20-80 years, 30 pack-year currently smoking OR have quit w/in 15years.) does not qualify.   Lung Cancer Screening Referral: no  Additional Screening:  Hepatitis C Screening: does qualify; Completed  03/01/2016  Vision Screening: Recommended annual ophthalmology exams for early detection of glaucoma and other disorders of the eye. Is the patient up to date with their annual eye exam?  Yes  Who is the provider or what is the name of the office in which the patient attends annual eye exams? [facility] If pt is not established with a provider, would they like to be referred to a provider to establish care? No .   Dental Screening: Recommended annual dental exams for proper oral hygiene  Community Resource Referral / Chronic Care Management: CRR required this visit?  No   CCM required this visit?  No      Plan:     I have personally reviewed and noted the following in the patient's chart:   Medical and social history Use of alcohol, tobacco or illicit drugs  Current medications and supplements including opioid prescriptions.  Functional ability and status Nutritional status Physical activity Advanced directives List of other physicians Hospitalizations, surgeries, and ER visits in previous 12 months Vitals Screenings to include cognitive, depression, and falls Referrals and appointments  In addition, I have reviewed and discussed with patient certain preventive protocols, quality metrics, and best practice recommendations. A written personalized care plan for preventive services as well as general preventive health recommendations were provided to patient.     Kellie Simmering, LPN   2/29/7989   Nurse Notes: none

## 2021-05-31 NOTE — Patient Instructions (Signed)
[name] , Thank you for taking time to come for your Medicare Wellness Visit. I appreciate your ongoing commitment to your health goals. Please review the following plan we discussed and let me know if I can assist you in the future.   Screening recommendations/referrals: Colonoscopy: completed 02/02/2013, scheduled for June Mammogram: completed Feb or Mar 2023 Bone Density: completed 03/19/2016 Recommended yearly ophthalmology/optometry visit for glaucoma screening and checkup Recommended yearly dental visit for hygiene and checkup  Vaccinations: Influenza vaccine: due 08/14/2021 Pneumococcal vaccine: allergy Tdap vaccine: completed 11/26/2012, due 11/27/2022 Shingles vaccine: discussed   Covid-19: 11/01/2020, 05/16/2020, 11/16/2019, 03/22/2019, 02/25/2019  Advanced directives: Please bring a copy of your POA (Power of Attorney) and/or Living Will to your next appointment.    Conditions/risks identified: none  Next appointment: Follow up in one year for your annual wellness visit    Preventive Care 65 Years and Older, Female Preventive care refers to lifestyle choices and visits with your health care provider that can promote health and wellness. What does preventive care include? A yearly physical exam. This is also called an annual well check. Dental exams once or twice a year. Routine eye exams. Ask your health care provider how often you should have your eyes checked. Personal lifestyle choices, including: Daily care of your teeth and gums. Regular physical activity. Eating a healthy diet. Avoiding tobacco and drug use. Limiting alcohol use. Practicing safe sex. Taking low-dose aspirin every day. Taking vitamin and mineral supplements as recommended by your health care provider. What happens during an annual well check? The services and screenings done by your health care provider during your annual well check will depend on your age, overall health, lifestyle risk factors, and  family history of disease. Counseling  Your health care provider may ask you questions about your: Alcohol use. Tobacco use. Drug use. Emotional well-being. Home and relationship well-being. Sexual activity. Eating habits. History of falls. Memory and ability to understand (cognition). Work and work Statistician. Reproductive health. Screening  You may have the following tests or measurements: Height, weight, and BMI. Blood pressure. Lipid and cholesterol levels. These may be checked every 5 years, or more frequently if you are over 70 years old. Skin check. Lung cancer screening. You may have this screening every year starting at age 30 if you have a 30-pack-year history of smoking and currently smoke or have quit within the past 15 years. Fecal occult blood test (FOBT) of the stool. You may have this test every year starting at age 35. Flexible sigmoidoscopy or colonoscopy. You may have a sigmoidoscopy every 5 years or a colonoscopy every 10 years starting at age 81. Hepatitis C blood test. Hepatitis B blood test. Sexually transmitted disease (STD) testing. Diabetes screening. This is done by checking your blood sugar (glucose) after you have not eaten for a while (fasting). You may have this done every 1-3 years. Bone density scan. This is done to screen for osteoporosis. You may have this done starting at age 85. Mammogram. This may be done every 1-2 years. Talk to your health care provider about how often you should have regular mammograms. Talk with your health care provider about your test results, treatment options, and if necessary, the need for more tests. Vaccines  Your health care provider may recommend certain vaccines, such as: Influenza vaccine. This is recommended every year. Tetanus, diphtheria, and acellular pertussis (Tdap, Td) vaccine. You may need a Td booster every 10 years. Zoster vaccine. You may need this after age  60. Pneumococcal 13-valent conjugate  (PCV13) vaccine. One dose is recommended after age 31. Pneumococcal polysaccharide (PPSV23) vaccine. One dose is recommended after age 31. Talk to your health care provider about which screenings and vaccines you need and how often you need them. This information is not intended to replace advice given to you by your health care provider. Make sure you discuss any questions you have with your health care provider. Document Released: 01/27/2015 Document Revised: 09/20/2015 Document Reviewed: 11/01/2014 Elsevier Interactive Patient Education  2017 [place] Prevention in the Home Falls can cause injuries. They can happen to people of all ages. There are many things you can do to make your home safe and to help prevent falls. What can I do on the outside of my home? Regularly fix the edges of walkways and driveways and fix any cracks. Remove anything that might make you trip as you walk through a door, such as a raised step or threshold. Trim any bushes or trees on the path to your home. Use bright outdoor lighting. Clear any walking paths of anything that might make someone trip, such as rocks or tools. Regularly check to see if handrails are loose or broken. Make sure that both sides of any steps have handrails. Any raised decks and porches should have guardrails on the edges. Have any leaves, snow, or ice cleared regularly. Use sand or salt on walking paths during winter. Clean up any spills in your garage right away. This includes oil or grease spills. What can I do in the bathroom? Use night lights. Install grab bars by the toilet and in the tub and shower. Do not use towel bars as grab bars. Use non-skid mats or decals in the tub or shower. If you need to sit down in the shower, use a plastic, non-slip stool. Keep the floor dry. Clean up any water that spills on the floor as soon as it happens. Remove soap buildup in the tub or shower regularly. Attach bath mats securely with  double-sided non-slip rug tape. Do not have throw rugs and other things on the floor that can make you trip. What can I do in the bedroom? Use night lights. Make sure that you have a light by your bed that is easy to reach. Do not use any sheets or blankets that are too big for your bed. They should not hang down onto the floor. Have a firm chair that has side arms. You can use this for support while you get dressed. Do not have throw rugs and other things on the floor that can make you trip. What can I do in the kitchen? Clean up any spills right away. Avoid walking on wet floors. Keep items that you use a lot in easy-to-reach places. If you need to reach something above you, use a strong step stool that has a grab bar. Keep electrical cords out of the way. Do not use floor polish or wax that makes floors slippery. If you must use wax, use non-skid floor wax. Do not have throw rugs and other things on the floor that can make you trip. What can I do with my stairs? Do not leave any items on the stairs. Make sure that there are handrails on both sides of the stairs and use them. Fix handrails that are broken or loose. Make sure that handrails are as long as the stairways. Check any carpeting to make sure that it is firmly attached to the stairs. Fix  any carpet that is loose or worn. Avoid having throw rugs at the top or bottom of the stairs. If you do have throw rugs, attach them to the floor with carpet tape. Make sure that you have a light switch at the top of the stairs and the bottom of the stairs. If you do not have them, ask someone to add them for you. What else can I do to help prevent falls? Wear shoes that: Do not have high heels. Have rubber bottoms. Are comfortable and fit you well. Are closed at the toe. Do not wear sandals. If you use a stepladder: Make sure that it is fully opened. Do not climb a closed stepladder. Make sure that both sides of the stepladder are locked  into place. Ask someone to hold it for you, if possible. Clearly mark and make sure that you can see: Any grab bars or handrails. First and last steps. Where the edge of each step is. Use tools that help you move around (mobility aids) if they are needed. These include: Canes. Walkers. Scooters. Crutches. Turn on the lights when you go into a dark area. Replace any light bulbs as soon as they burn out. Set up your furniture so you have a clear path. Avoid moving your furniture around. If any of your floors are uneven, fix them. If there are any pets around you, be aware of where they are. Review your medicines with your doctor. Some medicines can make you feel dizzy. This can increase your chance of falling. Ask your doctor what other things that you can do to help prevent falls. This information is not intended to replace advice given to you by your health care provider. Make sure you discuss any questions you have with your health care provider. Document Released: 10/27/2008 Document Revised: 06/08/2015 Document Reviewed: 02/04/2014 Elsevier Interactive Patient Education  2017 [employer].

## 2021-06-06 ENCOUNTER — Ambulatory Visit: Payer: Medicare Other | Admitting: Nurse Practitioner

## 2021-06-06 ENCOUNTER — Encounter: Payer: Self-pay | Admitting: Nurse Practitioner

## 2021-06-06 VITALS — BP 126/70 | HR 79 | Temp 97.9°F | Ht 61.4 in | Wt 200.0 lb

## 2021-06-06 DIAGNOSIS — I10 Essential (primary) hypertension: Secondary | ICD-10-CM | POA: Diagnosis not present

## 2021-06-06 DIAGNOSIS — Z6837 Body mass index (BMI) 37.0-37.9, adult: Secondary | ICD-10-CM

## 2021-06-06 NOTE — Patient Instructions (Addendum)
Obesity, Adult ?Obesity is having too much body fat. Being obese means that your weight is more than what is healthy for you.  ?BMI (body mass index) is a number that explains how much body fat you have. If you have a BMI of 30 or more, you are obese. ?Obesity can cause serious health problems, such as: ?Stroke. ?Coronary artery disease (CAD). ?Type 2 diabetes. ?Some types of cancer. ?High blood pressure (hypertension). ?High cholesterol. ?Gallbladder stones. ?Obesity can also contribute to: ?Osteoarthritis. ?Sleep apnea. ?Infertility problems. ?What are the causes? ?Eating meals each day that are high in calories, sugar, and fat. ?Drinking a lot of drinks that have sugar in them. ?Being born with genes that may make you more likely to become obese. ?Having a medical condition that causes obesity. ?Taking certain medicines. ?Sitting a lot (having a sedentary lifestyle). ?Not getting enough sleep. ?What increases the risk? ?Having a family history of obesity. ?Living in an area with limited access to: ?Parks, recreation centers, or sidewalks. ?Healthy food choices, such as grocery stores and farmers' markets. ?What are the signs or symptoms? ?The main sign is having too much body fat. ?How is this treated? ?Treatment for this condition often includes changing your lifestyle. Treatment may include: ?Changing your diet. This may include making a healthy meal plan. ?Exercise. This may include activity that causes your heart to beat faster (aerobic exercise) and strength training. Work with your doctor to design a program that works for you. ?Medicine to help you lose weight. This may be used if you are not able to lose one pound a week after 6 weeks of healthy eating and more exercise. ?Treating conditions that cause the obesity. ?Surgery. Options may include gastric banding and gastric bypass. This may be done if: ?Other treatments have not helped to improve your condition. ?You have a BMI of 40 or higher. ?You have  life-threatening health problems related to obesity. ?Follow these instructions at home: ?Eating and drinking ? ?Follow advice from your doctor about what to eat and drink. Your doctor may tell you to: ?Limit fast food, sweets, and processed snack foods. ?Choose low-fat options. For example, choose low-fat milk instead of whole milk. ?Eat five or more servings of fruits or vegetables each day. ?Eat at home more often. This gives you more control over what you eat. ?Choose healthy foods when you eat out. ?Learn to read food labels. This will help you learn how much food is in one serving. ?Keep low-fat snacks available. ?Avoid drinks that have a lot of sugar in them. These include soda, fruit juice, iced tea with sugar, and flavored milk. ?Drink enough water to keep your pee (urine) pale yellow. ?Do not go on fad diets. ?Physical activity ?Exercise often, as told by your doctor. Most adults should get up to 150 minutes of moderate-intensity exercise every week.Ask your doctor: ?What types of exercise are safe for you. ?How often you should exercise. ?Warm up and stretch before being active. ?Do slow stretching after being active (cool down). ?Rest between times of being active. ?Lifestyle ?Work with your doctor and a food expert (dietitian) to set a weight-loss goal that is best for you. ?Limit your screen time. ?Find ways to reward yourself that do not involve food. ?Do not drink alcohol if: ?Your doctor tells you not to drink. ?You are pregnant, may be pregnant, or are planning to become pregnant. ?If you drink alcohol: ?Limit how much you have to: ?0-1 drink a day for women. ?0-2 drinks   a day for men. Know how much alcohol is in your drink. In the [place] one drink equals one 12 oz bottle of beer (355 mL), one 5 oz glass of wine (148 mL), or one 1 oz glass of hard liquor (44 mL). General instructions Keep a weight-loss journal. This can help you keep track of: The food that you eat. How much exercise you  get. Take over-the-counter and prescription medicines only as told by your doctor. Take vitamins and supplements only as told by your doctor. Think about joining a support group. Pay attention to your mental health as obesity can lead to depression or self esteem issues. Keep all follow-up visits. Contact a doctor if: You cannot meet your weight-loss goal after you have changed your diet and lifestyle for 6 weeks. You are having trouble breathing. Summary Obesity is having too much body fat. Being obese means that your weight is more than what is healthy for you. Work with your doctor to set a weight-loss goal. Get regular exercise as told by your doctor. This information is not intended to replace advice given to you by your health care provider. Make sure you discuss any questions you have with your health care provider. Document Revised: 08/08/2020 Document Reviewed: 08/08/2020 Elsevier Patient Education  [place] (injection weekly), Contrave and Qsymia [phone] cash pay) pills

## 2021-06-06 NOTE — Progress Notes (Signed)
I,Tianna Badgett,acting as a Education administrator for [employer], FNP.,have documented all relevant documentation on the behalf of Minette Brine, FNP,as directed by  Minette Brine, FNP while in the presence of Minette Brine, [place].  This visit occurred during the SARS-CoV-2 public health emergency.  Safety protocols were in place, including screening questions prior to the visit, additional usage of staff PPE, and extensive cleaning of exam room while observing appropriate contact time as indicated for disinfecting solutions.  Subjective:     Patient ID: Joanne Reese , female    DOB: [birth year] , 76 y.o.   MRN: [id number]   Chief Complaint  Patient presents with   Weight Check    HPI  Patient presents today for weight check. Has been having more weight issues. She can walk but can not do fast walk. She is taking a blood pressure medications. She does eat increased amount of bread and limits her potatoes.  She does not do any activity due to her left knee issues. She is trying to drink over 100 oz water a day but has not done in the last 3 weeks.   Should would like to start medication for weight loss. She has tri    Past Medical History:  Diagnosis Date   Arthritis    Depression    GERD (gastroesophageal reflux disease)    occ   Hypothyroidism      Family History  Problem Relation Age of Onset   Emphysema Father      Current Outpatient Medications:    acyclovir (ZOVIRAX) 400 MG tablet, Take 400 mg by mouth as needed (for outbreaks). 1 tablet BID as needed., Disp: , Rfl:    acyclovir cream (ZOVIRAX) 5 %, Apply 1 application topically as needed., Disp: , Rfl:    amLODipine (NORVASC) 2.5 MG tablet, TAKE 1 TABLET BY MOUTH EVERY DAY, Disp: 90 tablet, Rfl: 1   aspirin EC 81 MG tablet, Take 81 mg by mouth daily. Swallow whole., Disp: , Rfl:    Biotin 10000 MCG TABS, Take by mouth. daily, Disp: , Rfl:    Cholecalciferol (VITAMIN D3) 50 MCG (2000 UT) capsule, Take 1 capsule by mouth daily. ,  Disp: , Rfl:    levothyroxine (SYNTHROID) 112 MCG tablet, Take 1 tablet (112 mcg total) by mouth daily before breakfast. (Patient taking differently: Take 112 mcg by mouth daily before breakfast. 1 pill Monday - Saturday, 1/2 tab on Sunday), Disp: 90 tablet, Rfl: 1   simvastatin (ZOCOR) 20 MG tablet, TAKE 1 TABLET BY MOUTH EVERY DAY IN THE EVENING, Disp: 90 tablet, Rfl: 2   Allergies  Allergen Reactions   Nitrofurantoin Anaphylaxis   Pneumococcal Vaccines Other (See Comments)    LOCAL REACTION Redness & Pain around injection site  Pneumoxvac 23 (active) per patient report     Review of Systems  Constitutional: Negative.   Respiratory: Negative.    Cardiovascular: Negative.   Gastrointestinal: Negative.   Neurological: Negative.     Today's Vitals   06/06/21 1209  BP: 126/70  Pulse: 79  Temp: 97.9 F (36.6 C)  TempSrc: Oral  Weight: 200 lb (90.7 kg)  Height: 5' 1.4" (1.56 m)  PainSc: 0-No pain   Body mass index is 37.3 kg/m.   Objective:  Physical Exam Vitals reviewed.  Constitutional:      General: She is not in acute distress.    Appearance: Normal appearance. She is obese.  Pulmonary:     Effort: Pulmonary effort is normal. No respiratory distress.  Neurological:     General: No focal deficit present.     Mental Status: She is alert and oriented to person, place, and time.     Cranial Nerves: No cranial nerve deficit.     Motor: No weakness.  Psychiatric:        Mood and Affect: Mood normal.        Behavior: Behavior normal.        Thought Content: Thought content normal.        Judgment: Judgment normal.        Assessment And Plan:     1. Essential hypertension, benign Blood pressure is well controlled, continue current medications  2. Class 2 severe obesity due to excess calories with serious comorbidity and body mass index (BMI) of 37.0 to 37.9 in adult [facility]) Comments: Will refer to PREP excercise program. I will check to see if phentermine vs Wegovy  vs Contrave/Qsymia is an option. She does have a sister who had thyroid CA, unsure of the type.  - Amb Referral To Provider Referral Exercise Program (P.R.E.P)     Patient was given opportunity to ask questions. Patient verbalized understanding of the plan and was able to repeat key elements of the plan. All questions were answered to their satisfaction.  Minette Brine, FNP   I, Minette Brine, FNP, have reviewed all documentation for this visit. The documentation on 06/06/21 for the exam, diagnosis, procedures, and orders are all accurate and complete.   IF YOU HAVE BEEN REFERRED TO A SPECIALIST, IT MAY TAKE 1-2 WEEKS TO SCHEDULE/PROCESS THE REFERRAL. IF YOU HAVE NOT HEARD FROM US/SPECIALIST IN TWO WEEKS, PLEASE GIVE [place] A CALL AT [phone] X [phone].   THE PATIENT IS ENCOURAGED TO PRACTICE SOCIAL DISTANCING DUE TO THE COVID-19 PANDEMIC.

## 2021-06-14 ENCOUNTER — Encounter: Payer: Self-pay | Admitting: Internal Medicine

## 2021-06-27 ENCOUNTER — Other Ambulatory Visit: Payer: Self-pay

## 2021-08-09 ENCOUNTER — Ambulatory Visit (INDEPENDENT_AMBULATORY_CARE_PROVIDER_SITE_OTHER): Payer: Medicare Other | Admitting: Internal Medicine

## 2021-08-09 ENCOUNTER — Encounter: Payer: Self-pay | Admitting: Internal Medicine

## 2021-08-09 VITALS — BP 122/84 | HR 78 | Temp 97.9°F | Ht 61.2 in | Wt 200.2 lb

## 2021-08-09 DIAGNOSIS — F4322 Adjustment disorder with anxiety: Secondary | ICD-10-CM | POA: Diagnosis not present

## 2021-08-09 DIAGNOSIS — I10 Essential (primary) hypertension: Secondary | ICD-10-CM

## 2021-08-09 DIAGNOSIS — R7309 Other abnormal glucose: Secondary | ICD-10-CM

## 2021-08-09 DIAGNOSIS — I700 Atherosclerosis of aorta: Secondary | ICD-10-CM | POA: Diagnosis not present

## 2021-08-09 DIAGNOSIS — E039 Hypothyroidism, unspecified: Secondary | ICD-10-CM | POA: Diagnosis not present

## 2021-08-09 DIAGNOSIS — Z0000 Encounter for general adult medical examination without abnormal findings: Secondary | ICD-10-CM | POA: Diagnosis not present

## 2021-08-09 DIAGNOSIS — Z2821 Immunization not carried out because of patient refusal: Secondary | ICD-10-CM

## 2021-08-09 DIAGNOSIS — E7800 Pure hypercholesterolemia, unspecified: Secondary | ICD-10-CM

## 2021-08-09 DIAGNOSIS — Z6837 Body mass index (BMI) 37.0-37.9, adult: Secondary | ICD-10-CM

## 2021-08-09 LAB — POCT URINALYSIS DIPSTICK
Bilirubin, UA: NEGATIVE
Glucose, UA: POSITIVE — AB
Ketones, UA: NEGATIVE
Nitrite, UA: NEGATIVE
Protein, UA: NEGATIVE
Spec Grav, UA: >=1.03 — AB (ref 1.010–1.025)
Urobilinogen, UA: 0.2 E.U./dL
pH, UA: 5 (ref 5.0–8.0)

## 2021-08-09 NOTE — Progress Notes (Signed)
Joanne Reese,acting as a Education administrator for Joanne Greenland, MD.,have documented all relevant documentation on the behalf of Joanne Greenland, MD,as directed by  Joanne Greenland, MD while in the presence of Joanne Greenland, MD.   Subjective:     Patient ID: Joanne Reese , female    DOB: May 24, 1945 , 76 y.o.   MRN: [id number]   Chief Complaint  Patient presents with   Annual Exam   Hypertension    HPI  Patient presents today for a physical. She reports compliance with meds. She denies headaches, chest pain and shortness of breath.   Hypertension This is a chronic problem. The current episode started more than 1 year ago. The problem has been rapidly improving since onset. The problem is controlled. Risk factors for coronary artery disease include dyslipidemia and post-menopausal state. Past treatments include calcium channel blockers. The current treatment provides moderate improvement.     Past Medical History:  Diagnosis Date   Arthritis    Depression    GERD (gastroesophageal reflux disease)    occ   Hypothyroidism      Family History  Problem Relation Age of Onset   Emphysema Father      Current Outpatient Medications:    acyclovir (ZOVIRAX) 400 MG tablet, Take 400 mg by mouth as needed (for outbreaks). 1 tablet BID as needed., Disp: , Rfl:    acyclovir cream (ZOVIRAX) 5 %, Apply 1 application topically as needed., Disp: , Rfl:    amLODipine (NORVASC) 2.5 MG tablet, TAKE 1 TABLET BY MOUTH EVERY DAY, Disp: 90 tablet, Rfl: 1   aspirin EC 81 MG tablet, Take 81 mg by mouth daily. Swallow whole., Disp: , Rfl:    Biotin 10000 MCG TABS, Take by mouth. daily, Disp: , Rfl:    Cholecalciferol (VITAMIN D3) 50 MCG (2000 UT) capsule, Take 1 capsule by mouth daily. , Disp: , Rfl:    levothyroxine (SYNTHROID) 112 MCG tablet, Take 1 tablet (112 mcg total) by mouth daily before breakfast. (Patient taking differently: Take 112 mcg by mouth daily before breakfast. 1 pill Monday -  Saturday, 1/2 tab on Sunday), Disp: 90 tablet, Rfl: 1   simvastatin (ZOCOR) 20 MG tablet, TAKE 1 TABLET BY MOUTH EVERY DAY IN THE EVENING, Disp: 90 tablet, Rfl: 2   Allergies  Allergen Reactions   Nitrofurantoin Anaphylaxis   Pneumococcal Vaccines Other (See Comments)    LOCAL REACTION Redness & Pain around injection site  Pneumoxvac 23 (active) per patient report      The patient states she uses post menopausal status for birth control. Last LMP was No LMP recorded (lmp unknown). Patient is postmenopausal.. Negative for Dysmenorrhea. Negative for: breast discharge, breast lump(s), breast pain and breast self exam. Associated symptoms include abnormal vaginal bleeding. Pertinent negatives include abnormal bleeding (hematology), anxiety, decreased libido, depression, difficulty falling sleep, dyspareunia, history of infertility, nocturia, sexual dysfunction, sleep disturbances, urinary incontinence, urinary urgency, vaginal discharge and vaginal itching. Diet regular.The patient states her exercise level is  intermittent.  . The patient's tobacco use is:  Social History   Tobacco Use  Smoking Status Never  Smokeless Tobacco Never  . She has been exposed to passive smoke. The patient's alcohol use is:  Social History   Substance and Sexual Activity  Alcohol Use Not Currently   Comment: occasional   Review of Systems  Constitutional: Negative.   HENT: Negative.    Eyes: Negative.   Respiratory: Negative.    Cardiovascular: Negative.  Gastrointestinal: Negative.   Endocrine: Negative.   Genitourinary: Negative.   Musculoskeletal: Negative.   Skin: Negative.   Allergic/Immunologic: Negative.   Neurological: Negative.   Hematological: Negative.   Psychiatric/Behavioral:  The patient is nervous/anxious.      Today's Vitals   08/09/21 1416  BP: 122/84  Pulse: 78  Temp: 97.9 F (36.6 C)  Weight: 200 lb 3.2 oz (90.8 kg)  Height: 5' 1.2" (1.554 m)  PainSc: 0-No pain   Body  mass index is 37.58 kg/m.  Wt Readings from Last 3 Encounters:  08/09/21 200 lb 3.2 oz (90.8 kg)  06/06/21 200 lb (90.7 kg)  05/31/21 199 lb 6.4 oz (90.4 kg)     Objective:  Physical Exam Vitals and nursing note reviewed.  Constitutional:      Appearance: Normal appearance. She is obese.  HENT:     Head: Normocephalic and atraumatic.     Right Ear: Tympanic membrane, ear canal and external ear normal.     Left Ear: Tympanic membrane, ear canal and external ear normal.     Nose: Nose normal.     Mouth/Throat:     Mouth: Mucous membranes are moist.     Pharynx: Oropharynx is clear.  Eyes:     Extraocular Movements: Extraocular movements intact.     Conjunctiva/sclera: Conjunctivae normal.     Pupils: Pupils are equal, round, and reactive to light.  Cardiovascular:     Rate and Rhythm: Normal rate and regular rhythm.     Pulses: Normal pulses.     Heart sounds: Normal heart sounds.  Pulmonary:     Effort: Pulmonary effort is normal.     Breath sounds: Normal breath sounds.  Chest:  Breasts:    Tanner Score is 5.     Right: Normal.     Left: Normal.  Abdominal:     General: Bowel sounds are normal.     Palpations: Abdomen is soft.     Comments: Soft, NABS  Genitourinary:    Comments: deferred Musculoskeletal:        General: Normal range of motion.     Cervical back: Normal range of motion and neck supple.  Skin:    General: Skin is warm and dry.  Neurological:     General: No focal deficit present.     Mental Status: She is alert and oriented to person, place, and time.  Psychiatric:        Mood and Affect: Mood normal.        Behavior: Behavior normal.      Assessment And Plan:     1. Encounter for general adult medical examination w/o abnormal findings Comments: A full exam was performed. Importance of monthly self breast exams was discussed with the patient. PATIENT IS ADVISED TO GET 30-45 MINUTES REGULAR EXERCISE NO LESS THAN FOUR TO FIVE DAYS PER WEEK -  BOTH WEIGHTBEARING EXERCISES AND AEROBIC ARE RECOMMENDED.  PATIENT IS ADVISED TO FOLLOW A HEALTHY DIET WITH AT LEAST SIX FRUITS/VEGGIES PER DAY, DECREASE INTAKE OF RED MEAT, AND TO INCREASE FISH INTAKE TO TWO DAYS PER WEEK.  MEATS/FISH SHOULD NOT BE FRIED, BAKED OR BROILED IS PREFERABLE.  IT IS ALSO IMPORTANT TO CUT BACK ON YOUR SUGAR INTAKE. PLEASE AVOID ANYTHING WITH ADDED SUGAR, CORN SYRUP OR OTHER SWEETENERS. IF YOU MUST USE A SWEETENER, YOU CAN TRY STEVIA. IT IS ALSO IMPORTANT TO AVOID ARTIFICIALLY SWEETENERS AND DIET BEVERAGES. LASTLY, I SUGGEST WEARING SPF 50 SUNSCREEN ON EXPOSED PARTS AND ESPECIALLY WHEN  IN THE DIRECT SUNLIGHT FOR AN EXTENDED PERIOD OF TIME.  PLEASE AVOID FAST FOOD RESTAURANTS AND INCREASE YOUR WATER INTAKE.   2. Essential hypertension, benign Comments: Chronic, well controlled. EKG performed, NSR w/ old inferior infarct-no new changes. She will c/w amlodipine 2.67m daily. She will f/u in 6 months.  - POCT Urinalysis Dipstick (81002) - Microalbumin / Creatinine Urine Ratio - EKG 12-Lead - CBC - CMP14+EGFR - Lipid panel  3. Adjustment disorder with anxious mood Comments: I will refer her for counseling as requested.  - Ambulatory referral to Psychology  4. Atherosclerosis of aorta (HCC) Comments: Chronic, LDL goal <70.  She is currently on simvastatin 260mdaily. I will check lipid panel and adjust meds as needed.  - Lipid panel  5. Primary hypothyroidism Comments: I will check thyroid panel and adjust meds as needed. She will c/w levothyroxine 11224mdaily M-Sat and 1/2 tab on Sun.  - TSH - T4  6. Other abnormal glucose Comments: Her a1c has been elevated in the past. I will recheck today. She is encouraged to limit her intake of sweetened beverages and foods.  - Hemoglobin A1c  7. Pure hypercholesterolemia Comments: Chronic, currently on simvastatin 36m43mily.  - Lipid panel  8. Class 2 severe obesity due to excess calories with serious comorbidity and body  mass index (BMI) of 37.0 to 37.9 in adult ([facility]: She is encouraged to aim for at least 150 minutes of exercise per week, while striving for BMI<30 to decrease cardiac risk.   9. Varicella zoster virus (VZV) vaccination declined  Patient was given opportunity to ask questions. Patient verbalized understanding of the plan and was able to repeat key elements of the plan. All questions were answered to their satisfaction.   I, RobyMaximino Reese, have reviewed all documentation for this visit. The documentation on 08/10/21 for the exam, diagnosis, procedures, and orders are all accurate and complete.   THE PATIENT IS ENCOURAGED TO PRACTICE SOCIAL DISTANCING DUE TO THE COVID-19 PANDEMIC.

## 2021-08-09 NOTE — Patient Instructions (Signed)

## 2021-08-10 LAB — CMP14+EGFR
ALT: 17 IU/L (ref 0–32)
AST: 20 IU/L (ref 0–40)
Albumin/Globulin Ratio: 1.5 (ref 1.2–2.2)
Albumin: 4.1 g/dL (ref 3.8–4.8)
Alkaline Phosphatase: 107 IU/L (ref 44–121)
BUN/Creatinine Ratio: 14 (ref 12–28)
BUN: 11 mg/dL (ref 8–27)
Bilirubin Total: 0.6 mg/dL (ref 0.0–1.2)
CO2: 23 mmol/L (ref 20–29)
Calcium: 8.9 mg/dL (ref 8.7–10.3)
Chloride: 103 mmol/L (ref 96–106)
Creatinine, Ser: 0.79 mg/dL (ref 0.57–1.00)
Globulin, Total: 2.7 g/dL (ref 1.5–4.5)
Glucose: 93 mg/dL (ref 70–99)
Potassium: 4.5 mmol/L (ref 3.5–5.2)
Sodium: 142 mmol/L (ref 134–144)
Total Protein: 6.8 g/dL (ref 6.0–8.5)
eGFR: 78 mL/min/{1.73_m2} (ref >59)

## 2021-08-10 LAB — HEMOGLOBIN A1C
Est. average glucose Bld gHb Est-mCnc: 120 mg/dL
Hgb A1c MFr Bld: 5.8 % — ABNORMAL HIGH (ref 4.8–5.6)

## 2021-08-10 LAB — CBC
Hematocrit: 42.6 % (ref 34.0–46.6)
Hemoglobin: 14.5 g/dL (ref 11.1–15.9)
MCH: 28.5 pg (ref 26.6–33.0)
MCHC: 34 g/dL (ref 31.5–35.7)
MCV: 84 fL (ref 79–97)
Platelets: 231 10*3/uL (ref 150–450)
RBC: 5.08 x10E6/uL (ref 3.77–5.28)
RDW: 13.3 % (ref 11.7–15.4)
WBC: 7.5 10*3/uL (ref 3.4–10.8)

## 2021-08-10 LAB — MICROALBUMIN / CREATININE URINE RATIO
Creatinine, Urine: 158.7 mg/dL
Microalb/Creat Ratio: 4 mg/g creat (ref 0–29)
Microalbumin, Urine: 6.5 ug/mL

## 2021-08-10 LAB — LIPID PANEL
Chol/HDL Ratio: 3.4 ratio (ref 0.0–4.4)
Cholesterol, Total: 155 mg/dL (ref 100–199)
HDL: 45 mg/dL (ref >39)
LDL Chol Calc (NIH): 93 mg/dL (ref 0–99)
Triglycerides: 92 mg/dL (ref 0–149)
VLDL Cholesterol Cal: 17 mg/dL (ref 5–40)

## 2021-08-10 LAB — T4: T4, Total: 12.3 ug/dL — ABNORMAL HIGH (ref 4.5–12.0)

## 2021-08-10 LAB — TSH: TSH: 0.307 u[IU]/mL — ABNORMAL LOW (ref 0.450–4.500)

## 2021-08-14 ENCOUNTER — Other Ambulatory Visit: Payer: Medicare Other

## 2021-08-14 DIAGNOSIS — R8290 Unspecified abnormal findings in urine: Secondary | ICD-10-CM

## 2021-08-16 LAB — URINE CULTURE

## 2021-09-03 ENCOUNTER — Other Ambulatory Visit: Payer: Self-pay

## 2021-09-03 ENCOUNTER — Other Ambulatory Visit: Payer: Medicare Other

## 2021-09-03 MED ORDER — SIMVASTATIN 40 MG PO TABS: 40.0000 mg | ORAL_TABLET | Freq: Every evening | ORAL | 1 refills | Status: DC

## 2021-09-04 ENCOUNTER — Other Ambulatory Visit: Payer: Self-pay | Admitting: Internal Medicine

## 2021-09-05 ENCOUNTER — Telehealth: Payer: Self-pay

## 2021-09-05 ENCOUNTER — Other Ambulatory Visit (INDEPENDENT_AMBULATORY_CARE_PROVIDER_SITE_OTHER): Payer: Medicare Other

## 2021-09-05 ENCOUNTER — Other Ambulatory Visit: Payer: Self-pay

## 2021-09-05 DIAGNOSIS — I10 Essential (primary) hypertension: Secondary | ICD-10-CM

## 2021-09-05 LAB — POCT URINALYSIS DIPSTICK
Bilirubin, UA: NEGATIVE
Blood, UA: NEGATIVE
Glucose, UA: NEGATIVE
Ketones, UA: NEGATIVE
Leukocytes, UA: NEGATIVE
Nitrite, UA: NEGATIVE
Protein, UA: NEGATIVE
Spec Grav, UA: 1.025 (ref 1.010–1.025)
Urobilinogen, UA: 0.2 E.U./dL
pH, UA: 6 (ref 5.0–8.0)

## 2021-09-05 MED ORDER — AMLODIPINE BESYLATE 2.5 MG PO TABS: 2.5000 mg | ORAL_TABLET | Freq: Every day | ORAL | 1 refills | Status: DC

## 2021-09-10 ENCOUNTER — Ambulatory Visit (INDEPENDENT_AMBULATORY_CARE_PROVIDER_SITE_OTHER): Payer: Medicare Other | Admitting: Family Medicine

## 2021-09-12 ENCOUNTER — Telehealth: Payer: Self-pay

## 2021-09-19 ENCOUNTER — Other Ambulatory Visit: Payer: Self-pay | Admitting: Internal Medicine

## 2021-09-20 ENCOUNTER — Other Ambulatory Visit: Payer: Self-pay

## 2021-09-20 MED ORDER — ATORVASTATIN CALCIUM 20 MG PO TABS: 20.0000 mg | ORAL_TABLET | Freq: Every day | ORAL | 1 refills | Status: DC

## 2021-09-29 ENCOUNTER — Other Ambulatory Visit: Payer: Self-pay | Admitting: Internal Medicine

## 2021-10-12 ENCOUNTER — Telehealth: Payer: Self-pay | Admitting: *Deleted

## 2021-10-12 NOTE — Telephone Encounter (Signed)
Contacted patient regarding PREP class referral. She was busy an unable to speak at this time. Stated she would return my call.

## 2021-10-13 ENCOUNTER — Other Ambulatory Visit: Payer: Self-pay | Admitting: Internal Medicine

## 2021-10-30 ENCOUNTER — Ambulatory Visit: Payer: Medicare Other | Admitting: Clinical

## 2021-10-31 ENCOUNTER — Ambulatory Visit: Payer: Medicare Other | Admitting: Internal Medicine

## 2021-10-31 ENCOUNTER — Encounter: Payer: Self-pay | Admitting: Internal Medicine

## 2021-10-31 VITALS — BP 130/80 | HR 73 | Temp 97.8°F | Ht 61.6 in | Wt 198.8 lb

## 2021-10-31 DIAGNOSIS — E7800 Pure hypercholesterolemia, unspecified: Secondary | ICD-10-CM | POA: Diagnosis not present

## 2021-10-31 DIAGNOSIS — R8290 Unspecified abnormal findings in urine: Secondary | ICD-10-CM

## 2021-10-31 DIAGNOSIS — E039 Hypothyroidism, unspecified: Secondary | ICD-10-CM

## 2021-10-31 DIAGNOSIS — E6609 Other obesity due to excess calories: Secondary | ICD-10-CM

## 2021-10-31 DIAGNOSIS — Z6836 Body mass index (BMI) 36.0-36.9, adult: Secondary | ICD-10-CM

## 2021-10-31 DIAGNOSIS — Z2821 Immunization not carried out because of patient refusal: Secondary | ICD-10-CM

## 2021-10-31 NOTE — Progress Notes (Signed)
[name],acting as a Education administrator for [name], MD.,have documented all relevant documentation on the behalf of [name], MD,as directed by  [name], MD while in the presence of [name], MD.    Subjective:     Patient ID: Joanne Reese , female    DOB: 09-26-1945 , 76 y.o.   MRN: [id number]   Chief Complaint  Patient presents with   Hyperlipidemia    HPI  Patient presents today for a chol check. She is now on atorvastatin '20mg'$  daily. She was previously on simvastatin, '40mg'$  dose was needed due to LDL>70. Given h/o atherosclerosis, she needs LDL < 70. She is on concomitant amlodipine, so she had to be switched to atorvastatin.  She would also like for her urine to be rechecked today, has h/o glucosuria and bacteriuria. She was reminded that repeat urinalysis was normal. Despite this, she wants to have this rechecked today.  She denies having any urinary symptoms at this time.   Hyperlipidemia This is a chronic problem. The problem is controlled. Exacerbating diseases include hypothyroidism. She has no history of diabetes.     Past Medical History:  Diagnosis Date   Arthritis    Depression    GERD (gastroesophageal reflux disease)    occ   Hypothyroidism      Family History  Problem Relation Age of Onset   Emphysema Father      Current Outpatient Medications:    acyclovir (ZOVIRAX) 400 MG tablet, Take 400 mg by mouth as needed (for outbreaks). 1 tablet BID as needed., Disp: , Rfl:    acyclovir cream (ZOVIRAX) 5 %, Apply 1 application topically as needed., Disp: , Rfl:    amLODipine (NORVASC) 2.5 MG tablet, Take 1 tablet (2.5 mg total) by mouth daily., Disp: 90 tablet, Rfl: 1   aspirin EC 81 MG tablet, Take 81 mg by mouth daily. Swallow whole., Disp: , Rfl:    atorvastatin (LIPITOR) 20 MG tablet, TAKE 1 TABLET BY MOUTH EVERY DAY, Disp: 30 tablet, Rfl: 1   Biotin 10000 MCG TABS, Take by mouth. daily, Disp: , Rfl:    Cholecalciferol  (VITAMIN D3) 50 MCG (2000 UT) capsule, Take 1 capsule by mouth daily. , Disp: , Rfl:    levothyroxine (SYNTHROID) 112 MCG tablet, Take 1 tablet (112 mcg total) by mouth daily before breakfast. (Patient taking differently: Take 112 mcg by mouth daily before breakfast. 1 pill Monday - Saturday skip sunday), Disp: 90 tablet, Rfl: 1   simvastatin (ZOCOR) 40 MG tablet, Take 1 tablet (40 mg total) by mouth every evening. (Patient not taking: Reported on 10/31/2021), Disp: 30 tablet, Rfl: 1   Allergies  Allergen Reactions   Nitrofurantoin Anaphylaxis   Pneumococcal Vaccines Other (See Comments)    LOCAL REACTION Redness & Pain around injection site  Pneumoxvac 23 (active) per patient report     Review of Systems  Constitutional: Negative.   Respiratory: Negative.    Cardiovascular: Negative.   Gastrointestinal: Negative.   Neurological: Negative.   Psychiatric/Behavioral: Negative.       Today's Vitals   10/31/21 1059  BP: 130/80  Pulse: 73  Temp: 97.8 F (36.6 C)  Weight: 198 lb 12.8 oz (90.2 kg)  Height: 5' 1.6" (1.565 m)  PainSc: 0-No pain   Body mass index is 36.83 kg/m.  Wt Readings from Last 3 Encounters:  10/31/21 198 lb 12.8 oz (90.2 kg)  08/09/21 200 lb 3.2 oz (90.8 kg)  06/06/21 200  lb (90.7 kg)    Objective:  Physical Exam Vitals and nursing note reviewed.  Constitutional:      Appearance: Normal appearance.  HENT:     Head: Normocephalic and atraumatic.     Nose:     Comments: Masked     Mouth/Throat:     Comments: Masked  Eyes:     Extraocular Movements: Extraocular movements intact.  Cardiovascular:     Rate and Rhythm: Normal rate and regular rhythm.     Heart sounds: Normal heart sounds.  Pulmonary:     Effort: Pulmonary effort is normal.     Breath sounds: Normal breath sounds.  Musculoskeletal:     Cervical back: Normal range of motion.  Skin:    General: Skin is warm.  Neurological:     General: No focal deficit present.     Mental Status:  She is alert.  Psychiatric:        Mood and Affect: Mood normal.        Behavior: Behavior normal.      Assessment And Plan:     1. Pure hypercholesterolemia Comments: I will check lipid panel and LFTs today. I will make further recommendations once her labs are available.  - Lipid panel - ALT  2. Primary hypothyroidism Comments: I will check thyroid panel and adjust meds as needed.  - TSH + free T4  3. Urine findings abnormal Comments: I will send u/a to [place]. I will make further recommendations once her labs are available for review.  - Urinalysis, Complete (81001)  4. Class 2 obesity due to excess calories without serious comorbidity with body mass index (BMI) of 36.0 to 36.9 in adult Comments: She is encouraged to aim for at least 150 minutes of exercise per week, while striving for BMI<30 to decrease cardiac risk. Advised to call [facility] for appt.  5. Herpes zoster vaccination declined   Patient was given opportunity to ask questions. Patient verbalized understanding of the plan and was able to repeat key elements of the plan. All questions were answered to their satisfaction.   I, [name], MD, have reviewed all documentation for this visit. The documentation on 10/31/21 for the exam, diagnosis, procedures, and orders are all accurate and complete.   IF YOU HAVE BEEN REFERRED TO A SPECIALIST, IT MAY TAKE 1-2 WEEKS TO SCHEDULE/PROCESS THE REFERRAL. IF YOU HAVE NOT HEARD FROM US/SPECIALIST IN TWO WEEKS, PLEASE GIVE [place] A CALL AT [phone] X [phone].   THE PATIENT IS ENCOURAGED TO PRACTICE SOCIAL DISTANCING DUE TO THE COVID-19 PANDEMIC.

## 2021-11-01 ENCOUNTER — Ambulatory Visit: Payer: Self-pay | Admitting: Internal Medicine

## 2021-11-01 LAB — URINALYSIS, COMPLETE
Bilirubin, UA: NEGATIVE
Glucose, UA: NEGATIVE
Ketones, UA: NEGATIVE
Nitrite, UA: NEGATIVE
Protein,UA: NEGATIVE
RBC, UA: NEGATIVE
Specific Gravity, UA: 1.024 (ref 1.005–1.030)
Urobilinogen, Ur: 0.2 mg/dL (ref 0.2–1.0)
pH, UA: 5.5 (ref 5.0–7.5)

## 2021-11-01 LAB — TSH+FREE T4
Free T4: 1.61 ng/dL (ref 0.82–1.77)
TSH: 1.98 u[IU]/mL (ref 0.450–4.500)

## 2021-11-01 LAB — LIPID PANEL
Chol/HDL Ratio: 3.4 ratio (ref 0.0–4.4)
Cholesterol, Total: 148 mg/dL (ref 100–199)
HDL: 43 mg/dL (ref >39)
LDL Chol Calc (NIH): 89 mg/dL (ref 0–99)
Triglycerides: 81 mg/dL (ref 0–149)
VLDL Cholesterol Cal: 16 mg/dL (ref 5–40)

## 2021-11-01 LAB — MICROSCOPIC EXAMINATION
Bacteria, UA: NONE SEEN
Casts: NONE SEEN /lpf
RBC, Urine: NONE SEEN /hpf (ref 0–2)

## 2021-11-01 LAB — ALT: ALT: 15 IU/L (ref 0–32)

## 2021-11-13 ENCOUNTER — Other Ambulatory Visit: Payer: Self-pay | Admitting: Internal Medicine

## 2021-11-22 ENCOUNTER — Ambulatory Visit: Payer: Medicare Other | Admitting: Clinical

## 2022-01-22 NOTE — Telephone Encounter (Signed)
[facility]-error.  

## 2022-02-26 ENCOUNTER — Ambulatory Visit: Payer: Medicare Other | Admitting: Internal Medicine

## 2022-02-28 ENCOUNTER — Ambulatory Visit: Payer: Medicare Other | Admitting: Internal Medicine

## 2022-02-28 ENCOUNTER — Encounter: Payer: Self-pay | Admitting: Internal Medicine

## 2022-02-28 VITALS — BP 132/76 | HR 74 | Temp 97.9°F | Ht 61.0 in | Wt 192.2 lb

## 2022-02-28 DIAGNOSIS — L299 Pruritus, unspecified: Secondary | ICD-10-CM

## 2022-02-28 DIAGNOSIS — I119 Hypertensive heart disease without heart failure: Secondary | ICD-10-CM

## 2022-02-28 DIAGNOSIS — Z2821 Immunization not carried out because of patient refusal: Secondary | ICD-10-CM

## 2022-02-28 DIAGNOSIS — I700 Atherosclerosis of aorta: Secondary | ICD-10-CM

## 2022-02-28 DIAGNOSIS — E039 Hypothyroidism, unspecified: Secondary | ICD-10-CM

## 2022-02-28 DIAGNOSIS — I10 Essential (primary) hypertension: Secondary | ICD-10-CM

## 2022-02-28 DIAGNOSIS — E7800 Pure hypercholesterolemia, unspecified: Secondary | ICD-10-CM

## 2022-02-28 DIAGNOSIS — E6609 Other obesity due to excess calories: Secondary | ICD-10-CM

## 2022-02-28 DIAGNOSIS — Z6836 Body mass index (BMI) 36.0-36.9, adult: Secondary | ICD-10-CM

## 2022-02-28 NOTE — Progress Notes (Signed)
Barnet Glasgow Martin,acting as a Education administrator for [name], MD.,have documented all relevant documentation on the behalf of [name], MD,as directed by  [name], MD while in the presence of [name], MD.    Subjective:     Patient ID: Joanne Reese , female    DOB: 02-06-1945 , 77 y.o.   MRN: [id number]   Chief Complaint  Patient presents with   Hypothyroidism   Hypertension    HPI  Patient presents today for a  BP, chol, thyroid check. Patient reports compliance with medications. She denies having any headaches, chest pain and shortness of breath.    BP Readings from Last 3 Encounters: 02/28/22 : 132/76 10/31/21 : 130/80 08/09/21 : 122/84    Hypertension This is a chronic problem. The current episode started more than 1 year ago. The problem has been rapidly improving since onset. The problem is controlled. Risk factors for coronary artery disease include dyslipidemia and post-menopausal state. Past treatments include calcium channel blockers. The current treatment provides moderate improvement.  Hyperlipidemia This is a chronic problem. The problem is controlled. Exacerbating diseases include hypothyroidism. She has no history of diabetes.     Past Medical History:  Diagnosis Date   Arthritis    Depression    GERD (gastroesophageal reflux disease)    occ   Hypothyroidism      Family History  Problem Relation Age of Onset   Emphysema Father      Current Outpatient Medications:    acyclovir (ZOVIRAX) 400 MG tablet, Take 400 mg by mouth as needed (for outbreaks). 1 tablet BID as needed., Disp: , Rfl:    acyclovir cream (ZOVIRAX) 5 %, Apply 1 application topically as needed., Disp: , Rfl:    amLODipine (NORVASC) 2.5 MG tablet, Take 1 tablet (2.5 mg total) by mouth daily., Disp: 90 tablet, Rfl: 1   aspirin EC 81 MG tablet, Take 81 mg by mouth daily. Swallow whole., Disp: , Rfl:    atorvastatin (LIPITOR) 20 MG tablet, TAKE 1 TABLET BY MOUTH  EVERY DAY, Disp: 90 tablet, Rfl: 1   Biotin 10000 MCG TABS, Take by mouth. daily, Disp: , Rfl:    Cholecalciferol (VITAMIN D3) 50 MCG (2000 UT) capsule, Take 1 capsule by mouth daily. , Disp: , Rfl:    levothyroxine (SYNTHROID) 112 MCG tablet, Take 1 tablet (112 mcg total) by mouth daily before breakfast. (Patient taking differently: Take 112 mcg by mouth daily before breakfast. 1 pill Monday - Saturday skip sunday), Disp: 90 tablet, Rfl: 1   Allergies  Allergen Reactions   Nitrofurantoin Anaphylaxis   Pneumococcal Vaccines Other (See Comments)    LOCAL REACTION Redness & Pain around injection site  Pneumoxvac 23 (active) per patient report     Review of Systems  Constitutional: Negative.   Respiratory: Negative.    Cardiovascular: Negative.   Skin:        She c/o itching both hands. She is not sure what triggers her sx. Denies seeing a rash. Thinks it could be caused by nerves.   Neurological: Negative.   Psychiatric/Behavioral: Negative.       Today's Vitals   02/28/22 0924  BP: 132/76  Pulse: 74  Temp: 97.9 F (36.6 C)  TempSrc: Oral  Weight: 192 lb 3.2 oz (87.2 kg)  Height: 5' 1"$  (1.549 m)  PainSc: 0-No pain   Body mass index is 36.32 kg/m.  Wt Readings from Last 3 Encounters:  02/28/22 192 lb 3.2 oz (  87.2 kg)  10/31/21 198 lb 12.8 oz (90.2 kg)  08/09/21 200 lb 3.2 oz (90.8 kg)    Objective:  Physical Exam Vitals and nursing note reviewed.  Constitutional:      Appearance: Normal appearance.  HENT:     Head: Normocephalic and atraumatic.     Nose:     Comments: Masked     Mouth/Throat:     Comments: Masked  Eyes:     Extraocular Movements: Extraocular movements intact.  Cardiovascular:     Rate and Rhythm: Normal rate and regular rhythm.     Heart sounds: Normal heart sounds.  Pulmonary:     Effort: Pulmonary effort is normal.     Breath sounds: Normal breath sounds.  Musculoskeletal:     Cervical back: Normal range of motion.  Skin:    General:  Skin is warm.  Neurological:     General: No focal deficit present.     Mental Status: She is alert.  Psychiatric:        Mood and Affect: Mood normal.        Behavior: Behavior normal.      Assessment And Plan:     1. Hypertensive heart disease without heart failure Comments: Chronic, fair control. Goal BP<130/80.  She will c/w amlodipine 2.18m qd for now. If persistently elevated, will increase dose. - CMP14+EGFR  2. Atherosclerosis of aorta (HCC) Comments: Chronic, pls see below.  3. Pure hypercholesterolemia Comments: Chronic, LDL goal < 70. She will c/w ASA 826mand atorvastatin 2028maily. - Lipid panel - CMP14+EGFR  4. Primary hypothyroidism Comments: I will check TSH and adjust meds as needed. - TSH  5. Itching of both hands Comments: No rash noted. I will check ANA, kidney/liver function - elevations in these parameters can contribute to her sx. - ANA, IFA (with reflex)  6. Class 2 obesity due to excess calories without serious comorbidity with body mass index (BMI) of 36.0 to 36.9 in adult Comments: She is encouraged to aim for at least 150 minutes of exercise/week while striving for BMI<30 to decrease cardiac risk.  7. Herpes zoster vaccination declined   Patient was given opportunity to ask questions. Patient verbalized understanding of the plan and was able to repeat key elements of the plan. All questions were answered to their satisfaction.   I, RobMaximino GreenlandD, have reviewed all documentation for this visit. The documentation on 03/03/22 for the exam, diagnosis, procedures, and orders are all accurate and complete.   IF YOU HAVE BEEN REFERRED TO A SPECIALIST, IT MAY TAKE 1-2 WEEKS TO SCHEDULE/PROCESS THE REFERRAL. IF YOU HAVE NOT HEARD FROM US/SPECIALIST IN TWO WEEKS, PLEASE GIVE US [place] AT [phone].   THE PATIENT IS ENCOURAGED TO PRACTICE SOCIAL DISTANCING DUE TO THE COVID-19 PANDEMIC.

## 2022-02-28 NOTE — Patient Instructions (Signed)

## 2022-03-03 LAB — CMP14+EGFR
ALT: 16 IU/L (ref 0–32)
AST: 15 IU/L (ref 0–40)
Albumin/Globulin Ratio: 1.6 (ref 1.2–2.2)
Albumin: 4.1 g/dL (ref 3.8–4.8)
Alkaline Phosphatase: 134 IU/L — ABNORMAL HIGH (ref 44–121)
BUN/Creatinine Ratio: 12 (ref 12–28)
BUN: 10 mg/dL (ref 8–27)
Bilirubin Total: 0.9 mg/dL (ref 0.0–1.2)
CO2: 25 mmol/L (ref 20–29)
Calcium: 9.2 mg/dL (ref 8.7–10.3)
Chloride: 102 mmol/L (ref 96–106)
Creatinine, Ser: 0.81 mg/dL (ref 0.57–1.00)
Globulin, Total: 2.6 g/dL (ref 1.5–4.5)
Glucose: 109 mg/dL — ABNORMAL HIGH (ref 70–99)
Potassium: 4.6 mmol/L (ref 3.5–5.2)
Sodium: 141 mmol/L (ref 134–144)
Total Protein: 6.7 g/dL (ref 6.0–8.5)
eGFR: 76 mL/min/{1.73_m2} (ref >59)

## 2022-03-03 LAB — LIPID PANEL
Chol/HDL Ratio: 3.1 ratio (ref 0.0–4.4)
Cholesterol, Total: 145 mg/dL (ref 100–199)
HDL: 47 mg/dL (ref >39)
LDL Chol Calc (NIH): 84 mg/dL (ref 0–99)
Triglycerides: 68 mg/dL (ref 0–149)
VLDL Cholesterol Cal: 14 mg/dL (ref 5–40)

## 2022-03-03 LAB — ANTINUCLEAR ANTIBODIES, IFA: ANA Titer 1: NEGATIVE

## 2022-03-03 LAB — TSH: TSH: 1.5 u[IU]/mL (ref 0.450–4.500)

## 2022-04-08 ENCOUNTER — Other Ambulatory Visit: Payer: Self-pay

## 2022-04-08 MED ORDER — ATORVASTATIN CALCIUM 40 MG PO TABS: 40.0000 mg | ORAL_TABLET | Freq: Every day | ORAL | 1 refills | Status: DC

## 2022-04-22 ENCOUNTER — Other Ambulatory Visit: Payer: Medicare Other

## 2022-04-25 ENCOUNTER — Other Ambulatory Visit: Payer: Medicare Other

## 2022-04-25 DIAGNOSIS — E7800 Pure hypercholesterolemia, unspecified: Secondary | ICD-10-CM

## 2022-04-26 LAB — LIPID PANEL
Chol/HDL Ratio: 2.9 ratio (ref 0.0–4.4)
Cholesterol, Total: 166 mg/dL (ref 100–199)
HDL: 58 mg/dL (ref >39)
LDL Chol Calc (NIH): 89 mg/dL (ref 0–99)
Triglycerides: 108 mg/dL (ref 0–149)
VLDL Cholesterol Cal: 19 mg/dL (ref 5–40)

## 2022-04-27 ENCOUNTER — Encounter: Payer: Self-pay | Admitting: Internal Medicine

## 2022-05-02 ENCOUNTER — Other Ambulatory Visit: Payer: Self-pay | Admitting: Internal Medicine

## 2022-05-14 ENCOUNTER — Other Ambulatory Visit: Payer: Self-pay | Admitting: Internal Medicine

## 2022-06-12 ENCOUNTER — Ambulatory Visit (INDEPENDENT_AMBULATORY_CARE_PROVIDER_SITE_OTHER): Payer: Medicare Other | Admitting: Internal Medicine

## 2022-06-12 ENCOUNTER — Ambulatory Visit (INDEPENDENT_AMBULATORY_CARE_PROVIDER_SITE_OTHER): Payer: Medicare Other

## 2022-06-12 ENCOUNTER — Encounter: Payer: Self-pay | Admitting: Internal Medicine

## 2022-06-12 VITALS — BP 124/72 | HR 88 | Temp 97.7°F | Ht 61.0 in | Wt 194.0 lb

## 2022-06-12 VITALS — BP 124/72 | HR 88 | Temp 97.7°F | Ht 61.4 in | Wt 194.0 lb

## 2022-06-12 DIAGNOSIS — E039 Hypothyroidism, unspecified: Secondary | ICD-10-CM

## 2022-06-12 DIAGNOSIS — Z0000 Encounter for general adult medical examination without abnormal findings: Secondary | ICD-10-CM

## 2022-06-12 DIAGNOSIS — I119 Hypertensive heart disease without heart failure: Secondary | ICD-10-CM

## 2022-06-12 DIAGNOSIS — R7309 Other abnormal glucose: Secondary | ICD-10-CM

## 2022-06-12 DIAGNOSIS — E6609 Other obesity due to excess calories: Secondary | ICD-10-CM

## 2022-06-12 DIAGNOSIS — E7800 Pure hypercholesterolemia, unspecified: Secondary | ICD-10-CM | POA: Diagnosis not present

## 2022-06-12 DIAGNOSIS — I700 Atherosclerosis of aorta: Secondary | ICD-10-CM

## 2022-06-12 DIAGNOSIS — Z6836 Body mass index (BMI) 36.0-36.9, adult: Secondary | ICD-10-CM

## 2022-06-12 LAB — BMP8+EGFR
BUN/Creatinine Ratio: 19 (ref 12–28)
BUN: 15 mg/dL (ref 8–27)
CO2: 25 mmol/L (ref 20–29)
Calcium: 9.4 mg/dL (ref 8.7–10.3)
Chloride: 102 mmol/L (ref 96–106)
Creatinine, Ser: 0.81 mg/dL (ref 0.57–1.00)
Glucose: 103 mg/dL — ABNORMAL HIGH (ref 70–99)
Potassium: 4.5 mmol/L (ref 3.5–5.2)
Sodium: 142 mmol/L (ref 134–144)
eGFR: 76 mL/min/{1.73_m2} (ref >59)

## 2022-06-12 LAB — HEMOGLOBIN A1C
Est. average glucose Bld gHb Est-mCnc: 140 mg/dL
Hgb A1c MFr Bld: 6.5 % — ABNORMAL HIGH (ref 4.8–5.6)

## 2022-06-12 NOTE — Patient Instructions (Addendum)
OMRON blood pressure cuff  Hypertension, Adult Hypertension is another name for high blood pressure. High blood pressure forces your heart to work harder to pump blood. This can cause problems over time. There are two numbers in a blood pressure reading. There is a top number (systolic) over a bottom number (diastolic). It is best to have a blood pressure that is below 120/80. What are the causes? The cause of this condition is not known. Some other conditions can lead to high blood pressure. What increases the risk? Some lifestyle factors can make you more likely to develop high blood pressure: Smoking. Not getting enough exercise or physical activity. Being overweight. Having too much fat, sugar, calories, or salt (sodium) in your diet. Drinking too much alcohol. Other risk factors include: Having any of these conditions: Heart disease. Diabetes. High cholesterol. Kidney disease. Obstructive sleep apnea. Having a family history of high blood pressure and high cholesterol. Age. The risk increases with age. Stress. What are the signs or symptoms? High blood pressure may not cause symptoms. Very high blood pressure (hypertensive crisis) may cause: Headache. Fast or uneven heartbeats (palpitations). Shortness of breath. Nosebleed. Vomiting or feeling like you may vomit (nauseous). Changes in how you see. Very bad chest pain. Feeling dizzy. Seizures. How is this treated? This condition is treated by making healthy lifestyle changes, such as: Eating healthy foods. Exercising more. Drinking less alcohol. Your doctor may prescribe medicine if lifestyle changes do not help enough and if: Your top number is above 130. Your bottom number is above 80. Your personal target blood pressure may vary. Follow these instructions at home: Eating and drinking  If told, follow the DASH eating plan. To follow this plan: Fill one half of your plate at each meal with fruits and  vegetables. Fill one fourth of your plate at each meal with whole grains. Whole grains include whole-wheat pasta, brown rice, and whole-grain bread. Eat or drink low-fat dairy products, such as skim milk or low-fat yogurt. Fill one fourth of your plate at each meal with low-fat (lean) proteins. Low-fat proteins include fish, chicken without skin, eggs, beans, and tofu. Avoid fatty meat, cured and processed meat, or chicken with skin. Avoid pre-made or processed food. Limit the amount of salt in your diet to less than 1,500 mg each day. Do not drink alcohol if: Your doctor tells you not to drink. You are pregnant, may be pregnant, or are planning to become pregnant. If you drink alcohol: Limit how much you have to: 0-1 drink a day for women. 0-2 drinks a day for men. Know how much alcohol is in your drink. In the [place] one drink equals one 12 oz bottle of beer (355 mL), one 5 oz glass of wine (148 mL), or one 1 oz glass of hard liquor (44 mL). Lifestyle  Work with your doctor to stay at a healthy weight or to lose weight. Ask your doctor what the best weight is for you. Get at least 30 minutes of exercise that causes your heart to beat faster (aerobic exercise) most days of the week. This may include walking, swimming, or biking. Get at least 30 minutes of exercise that strengthens your muscles (resistance exercise) at least 3 days a week. This may include lifting weights or doing Pilates. Do not smoke or use any products that contain nicotine or tobacco. If you need help quitting, ask your doctor. Check your blood pressure at home as told by your doctor. Keep all follow-up visits. Medicines  Take over-the-counter and prescription medicines only as told by your doctor. Follow directions carefully. Do not skip doses of blood pressure medicine. The medicine does not work as well if you skip doses. Skipping doses also puts you at risk for problems. Ask your doctor about side effects or  reactions to medicines that you should watch for. Contact a doctor if: You think you are having a reaction to the medicine you are taking. You have headaches that keep coming back. You feel dizzy. You have swelling in your ankles. You have trouble with your vision. Get help right away if: You get a very bad headache. You start to feel mixed up (confused). You feel weak or numb. You feel faint. You have very bad pain in your: Chest. Belly (abdomen). You vomit more than once. You have trouble breathing. These symptoms may be an emergency. Get help right away. Call 911. Do not wait to see if the symptoms will go away. Do not drive yourself to the hospital. Summary Hypertension is another name for high blood pressure. High blood pressure forces your heart to work harder to pump blood. For most people, a normal blood pressure is less than 120/80. Making healthy choices can help lower blood pressure. If your blood pressure does not get lower with healthy choices, you may need to take medicine. This information is not intended to replace advice given to you by your health care provider. Make sure you discuss any questions you have with your health care provider. Document Revised: 10/19/2020 Document Reviewed: 10/19/2020 Elsevier Patient Education  2024 [employer].

## 2022-06-12 NOTE — Progress Notes (Signed)
Subjective:   Joanne Reese is a 77 y.o. female who presents for Medicare Annual (Subsequent) preventive examination.  Review of Systems     Cardiac Risk Factors include: advanced age (>39men, >5 women);obesity (BMI >30kg/m2)     Objective:    Today's Vitals   06/12/22 0958  BP: 124/72  Pulse: 88  Temp: 97.7 F (36.5 C)  TempSrc: Oral  SpO2: 96%  Weight: 194 lb (88 kg)  Height: 5' 1.4" (1.56 m)   Body mass index is 36.18 kg/m.     06/12/2022   10:07 AM 05/31/2021   11:24 AM 05/25/2020    8:41 AM 05/19/2019    2:57 PM 04/30/2018    8:54 AM 02/10/2017    8:39 AM 01/28/2017    9:01 AM  Advanced Directives  Does Patient Have a Medical Advance Directive? Yes Yes Yes Yes Yes No Yes  Type of Estate agent of [place];Living will Healthcare Power of [place];Living will Healthcare Power of [place];Living will Healthcare Power of [place];Living will Healthcare Power of [place];Living will    Copy of Healthcare Power of Attorney in Chart? No - copy requested No - copy requested No - copy requested No - copy requested No - copy requested    Would patient like information on creating a medical advance directive?      No - Patient declined     Current Medications (verified) Outpatient Encounter Medications as of 06/12/2022  Medication Sig   acyclovir (ZOVIRAX) 400 MG tablet Take 400 mg by mouth as needed (for outbreaks). 1 tablet BID as needed.   acyclovir cream (ZOVIRAX) 5 % Apply 1 application topically as needed.   amLODipine (NORVASC) 2.5 MG tablet Take 1 tablet (2.5 mg total) by mouth daily.   aspirin EC 81 MG tablet Take 81 mg by mouth daily. Swallow whole.   atorvastatin (LIPITOR) 40 MG tablet TAKE 1 TABLET BY MOUTH EVERY DAY   Biotin 16109 MCG TABS Take by mouth. daily   cefdinir (OMNICEF) 300 MG capsule    Cholecalciferol (VITAMIN D3) 50 MCG (2000 UT) capsule Take 1 capsule by mouth daily.    levothyroxine (SYNTHROID) 112 MCG tablet Take 1  tablet (112 mcg total) by mouth daily before breakfast. (Patient taking differently: Take 112 mcg by mouth daily before breakfast. 1 pill Monday - Saturday skip sunday)   No facility-administered encounter medications on file as of 06/12/2022.    Allergies (verified) Nitrofurantoin and Pneumococcal vaccines   History: Past Medical History:  Diagnosis Date   Arthritis    Depression    GERD (gastroesophageal reflux disease)    occ   Hypothyroidism    Past Surgical History:  Procedure Laterality Date   CYST EXCISION     ovary  30+ yrs   PARTIAL KNEE ARTHROPLASTY Right 02/10/2017   Procedure: RIGHT UNICOMPARTMENTAL KNEE;  Surgeon: Dannielle Huh, MD;  Location: [facility] OR;  Service: Orthopedics;  Laterality: Right;   THYROID LOBECTOMY     Family History  Problem Relation Age of Onset   Emphysema Father    Social History   Socioeconomic History   Marital status: Widowed    Spouse name: Not on file   Number of children: 4   Years of education: Not on file   Highest education level: Not on file  Occupational History   Occupation: retired  Tobacco Use   Smoking status: Never   Smokeless tobacco: Never  Vaping Use   Vaping Use: Never used  Substance and Sexual  Activity   Alcohol use: Not Currently    Comment: occasional   Drug use: No   Sexual activity: Not Currently  Other Topics Concern   Not on file  Social History Narrative   Not on file   Social Determinants of Health   Financial Resource Strain: Low Risk  (06/12/2022)   Overall Financial Resource Strain (CARDIA)    Difficulty of Paying Living Expenses: Not hard at all  Food Insecurity: No Food Insecurity (06/12/2022)   Hunger Vital Sign    Worried About Running Out of Food in the Last Year: Never true    Ran Out of Food in the Last Year: Never true  Transportation Needs: No Transportation Needs (06/12/2022)   PRAPARE - Administrator, Civil Service (Medical): No    Lack of Transportation (Non-Medical): No   Physical Activity: Insufficiently Active (06/12/2022)   Exercise Vital Sign    Days of Exercise per Week: 4 days    Minutes of Exercise per Session: 30 min  Stress: No Stress Concern Present (06/12/2022)   [employer] of Occupational Health - Occupational Stress Questionnaire    Feeling of Stress : Not at all  Social Connections: Not on file    Tobacco Counseling Counseling given: Not Answered   Clinical Intake:  Pre-visit preparation completed: Yes  Pain : No/denies pain     Nutritional Status: BMI > 30  Obese Nutritional Risks: None Diabetes: No  How often do you need to have someone help you when you read instructions, pamphlets, or other written materials from your doctor or pharmacy?: 1 - Never  Diabetic?no  Interpreter Needed?: No  Information entered by :: NAllen LPN   Activities of Daily Living    06/12/2022   10:08 AM  In your present state of health, do you have any difficulty performing the following activities:  Hearing? 0  Vision? 0  Difficulty concentrating or making decisions? 0  Walking or climbing stairs? 0  Dressing or bathing? 0  Doing errands, shopping? 0  Preparing Food and eating ? N  Using the Toilet? N  In the past six months, have you accidently leaked urine? Y  Do you have problems with loss of bowel control? N  Managing your Medications? N  Managing your Finances? N  Housekeeping or managing your Housekeeping? N    Patient Care Team: Dorothyann Peng, MD as PCP - General (Internal Medicine) Sallye Lat, MD as Consulting Physician (Ophthalmology)  Indicate any recent Medical Services you may have received from other than [facility] providers in the past year (date may be approximate).     Assessment:   This is a routine wellness examination for [place].  Hearing/Vision screen Vision Screening - Comments:: Regular eye exams, [facility]  Dietary issues and exercise activities discussed: Current Exercise Habits:  Home exercise routine, Type of exercise: walking, Time (Minutes): 30, Frequency (Times/Week): 4, Weekly Exercise (Minutes/Week): 120   Goals Addressed             This Visit's Progress    Patient Stated       06/12/2022, no goals       Depression Screen    06/12/2022   10:08 AM 02/28/2022    9:23 AM 08/09/2021    2:44 PM 05/31/2021   11:24 AM 01/03/2021   11:18 AM 01/03/2021   10:55 AM 10/02/2020    2:33 PM  PHQ 2/9 Scores  PHQ - 2 Score 2 0 0 0 2 1 1  PHQ- 9 Score 3    8 2 2     Fall Risk    06/12/2022   10:07 AM 02/28/2022    9:23 AM 05/31/2021   11:24 AM 05/25/2020    8:41 AM 05/19/2019    2:57 PM  Fall Risk   Falls in the past year? 0 0 0 0 0  Number falls in past yr: 0 0 0    Injury with Fall? 0 0 0    Risk for fall due to : No Fall Risks;Medication side effect No Fall Risks Medication side effect Medication side effect   Follow up Falls prevention discussed;Education provided;Falls evaluation completed Falls evaluation completed Falls evaluation completed;Education provided;Falls prevention discussed Falls evaluation completed;Education provided;Falls prevention discussed Falls evaluation completed;Education provided;Falls prevention discussed    FALL RISK PREVENTION PERTAINING TO THE HOME:  Any stairs in or around the home? Yes  If so, are there any without handrails? No  Home free of loose throw rugs in walkways, pet beds, electrical cords, etc? Yes  Adequate lighting in your home to reduce risk of falls? Yes   ASSISTIVE DEVICES UTILIZED TO PREVENT FALLS:  Life alert? No  Use of a cane, walker or w/c? No  Grab bars in the bathroom? No  Shower chair or bench in shower? No  Elevated toilet seat or a handicapped toilet? No   TIMED UP AND GO:  Was the test performed? Yes .  Length of time to ambulate 10 feet: 5 sec.   Gait steady and fast without use of assistive device  Cognitive Function:        06/12/2022   10:09 AM 05/31/2021   11:25 AM 05/25/2020     8:42 AM 05/19/2019    2:59 PM 04/30/2018    8:57 AM  6CIT Screen  What Year? 0 points 0 points 0 points 0 points 0 points  What month? 0 points 0 points 0 points 0 points 0 points  What time? 0 points 0 points 0 points 0 points 0 points  Count back from 20 0 points 0 points 0 points 0 points 0 points  Months in reverse 0 points 0 points 0 points 0 points 0 points  Repeat phrase 2 points 2 points 2 points 0 points 0 points  Total Score 2 points 2 points 2 points 0 points 0 points    Immunizations Immunization History  Administered Date(s) Administered   Fluad Quad(high Dose 65+) 09/30/2018, 09/23/2019   Influenza Split 10/14/2017   Influenza, High Dose Seasonal PF 10/26/2020   Influenza-Unspecified 10/22/2021   Moderna Covid-19 Vaccine Bivalent Booster 19yrs & up 11/01/2020, 10/22/2021   Moderna Sars-Covid-2 Vaccination 02/25/2019, 03/22/2019, 11/16/2019, 05/16/2020   Pneumococcal Polysaccharide-23 06/05/2012    TDAP status: Up to date  Flu Vaccine status: Up to date  Pneumococcal vaccine status: Up to date  Covid-19 vaccine status: Completed vaccines  Qualifies for Shingles Vaccine? Yes   Zostavax completed No   Shingrix Completed?: No.    Education has been provided regarding the importance of this vaccine. Patient has been advised to call insurance company to determine out of pocket expense if they have not yet received this vaccine. Advised may also receive vaccine at local pharmacy or Health Dept. Verbalized acceptance and understanding.  Screening Tests Health Maintenance  Topic Date Due   DTaP/Tdap/Td (1 - Tdap) Never done   Zoster Vaccines- Shingrix (1 of 2) Never done   COVID-19 Vaccine (7 - 2023-24 season) 12/17/2021   Medicare Annual Wellness (AWV)  06/01/2022   Pneumonia Vaccine 56+ Years old (2 of 2 - PCV) 03/01/2023 (Originally 06/05/2013)   INFLUENZA VACCINE  08/15/2022   Colonoscopy  02/03/2023   DEXA SCAN  Completed   Hepatitis C Screening  Completed   HPV  VACCINES  Aged Out    Health Maintenance  Health Maintenance Due  Topic Date Due   DTaP/Tdap/Td (1 - Tdap) Never done   Zoster Vaccines- Shingrix (1 of 2) Never done   COVID-19 Vaccine (7 - 2023-24 season) 12/17/2021   Medicare Annual Wellness (AWV)  06/01/2022    Colorectal cancer screening: Type of screening: Colonoscopy. Completed 02/02/2013. Repeat every 5 years  Mammogram status: Completed 05/15/2022. Repeat every year  Bone Density status: Completed 03/19/2016.  Lung Cancer Screening: (Low Dose CT Chest recommended if Age 28-80 years, 30 pack-year currently smoking OR have quit w/in 15years.) does not qualify.   Lung Cancer Screening Referral: no  Additional Screening:  Hepatitis C Screening: does qualify; Completed 03/01/2016  Vision Screening: Recommended annual ophthalmology exams for early detection of glaucoma and other disorders of the eye. Is the patient up to date with their annual eye exam?  Yes  Who is the provider or what is the name of the office in which the patient attends annual eye exams? [facility] If pt is not established with a provider, would they like to be referred to a provider to establish care? No .   Dental Screening: Recommended annual dental exams for proper oral hygiene  Community Resource Referral / Chronic Care Management: CRR required this visit?  No   CCM required this visit?  No      Plan:     I have personally reviewed and noted the following in the patient's chart:   Medical and social history Use of alcohol, tobacco or illicit drugs  Current medications and supplements including opioid prescriptions. Patient is not currently taking opioid prescriptions. Functional ability and status Nutritional status Physical activity Advanced directives List of other physicians Hospitalizations, surgeries, and ER visits in previous 12 months Vitals Screenings to include cognitive, depression, and falls Referrals and appointments  In  addition, I have reviewed and discussed with patient certain preventive protocols, quality metrics, and best practice recommendations. A written personalized care plan for preventive services as well as general preventive health recommendations were provided to patient.     Barb Merino, LPN   1/61/0960   Nurse Notes: none

## 2022-06-12 NOTE — Patient Instructions (Signed)
Joanne Reese , Thank you for taking time to come for your Medicare Wellness Visit. I appreciate your ongoing commitment to your health goals. Please review the following plan we discussed and let me know if I can assist you in the future.   These are the goals we discussed:  Goals      Patient Stated     none     Patient Stated     05/25/2020, wants to weigh 175 pounds     Patient Stated     05/31/2021, no goals     Patient Stated     06/12/2022, no goals     Weight (lb) < 200 lb (90.7 kg)     05/19/2019, wants to lose 20 pounds        This is a list of the screening recommended for you and due dates:  Health Maintenance  Topic Date Due   COVID-19 Vaccine (7 - 2023-24 season) 12/17/2021   Zoster (Shingles) Vaccine (1 of 2) 09/12/2022*   Pneumonia Vaccine (2 of 2 - PCV) 03/01/2023*   Flu Shot  08/15/2022   DTaP/Tdap/Td vaccine (2 - Td or Tdap) 11/27/2022   Colon Cancer Screening  02/03/2023   Medicare Annual Wellness Visit  06/12/2023   DEXA scan (bone density measurement)  Completed   Hepatitis C Screening  Completed   HPV Vaccine  Aged Out  *Topic was postponed. The date shown is not the original due date.    Advanced directives: Please bring a copy of your POA (Power of Attorney) and/or Living Will to your next appointment.   Conditions/risks identified: none  Next appointment: Follow up in one year for your annual wellness visit    Preventive Care 65 Years and Older, Female Preventive care refers to lifestyle choices and visits with your health care provider that can promote health and wellness. What does preventive care include? A yearly physical exam. This is also called an annual well check. Dental exams once or twice a year. Routine eye exams. Ask your health care provider how often you should have your eyes checked. Personal lifestyle choices, including: Daily care of your teeth and gums. Regular physical activity. Eating a healthy diet. Avoiding tobacco and  drug use. Limiting alcohol use. Practicing safe sex. Taking low-dose aspirin every day. Taking vitamin and mineral supplements as recommended by your health care provider. What happens during an annual well check? The services and screenings done by your health care provider during your annual well check will depend on your age, overall health, lifestyle risk factors, and family history of disease. Counseling  Your health care provider may ask you questions about your: Alcohol use. Tobacco use. Drug use. Emotional well-being. Home and relationship well-being. Sexual activity. Eating habits. History of falls. Memory and ability to understand (cognition). Work and work Astronomer. Reproductive health. Screening  You may have the following tests or measurements: Height, weight, and BMI. Blood pressure. Lipid and cholesterol levels. These may be checked every 5 years, or more frequently if you are over 32 years old. Skin check. Lung cancer screening. You may have this screening every year starting at age 57 if you have a 30-pack-year history of smoking and currently smoke or have quit within the past 15 years. Fecal occult blood test (FOBT) of the stool. You may have this test every year starting at age 88. Flexible sigmoidoscopy or colonoscopy. You may have a sigmoidoscopy every 5 years or a colonoscopy every 10 years starting at age 19. Hepatitis  C blood test. Hepatitis B blood test. Sexually transmitted disease (STD) testing. Diabetes screening. This is done by checking your blood sugar (glucose) after you have not eaten for a while (fasting). You may have this done every 1-3 years. Bone density scan. This is done to screen for osteoporosis. You may have this done starting at age 47. Mammogram. This may be done every 1-2 years. Talk to your health care provider about how often you should have regular mammograms. Talk with your health care provider about your test results, treatment  options, and if necessary, the need for more tests. Vaccines  Your health care provider may recommend certain vaccines, such as: Influenza vaccine. This is recommended every year. Tetanus, diphtheria, and acellular pertussis (Tdap, Td) vaccine. You may need a Td booster every 10 years. Zoster vaccine. You may need this after age 12. Pneumococcal 13-valent conjugate (PCV13) vaccine. One dose is recommended after age 59. Pneumococcal polysaccharide (PPSV23) vaccine. One dose is recommended after age 42. Talk to your health care provider about which screenings and vaccines you need and how often you need them. This information is not intended to replace advice given to you by your health care provider. Make sure you discuss any questions you have with your health care provider. Document Released: 01/27/2015 Document Revised: 09/20/2015 Document Reviewed: 11/01/2014 Elsevier Interactive Patient Education  2017 [employer].  Fall Prevention in the Home Falls can cause injuries. They can happen to people of all ages. There are many things you can do to make your home safe and to help prevent falls. What can I do on the outside of my home? Regularly fix the edges of walkways and driveways and fix any cracks. Remove anything that might make you trip as you walk through a door, such as a raised step or threshold. Trim any bushes or trees on the path to your home. Use bright outdoor lighting. Clear any walking paths of anything that might make someone trip, such as rocks or tools. Regularly check to see if handrails are loose or broken. Make sure that both sides of any steps have handrails. Any raised decks and porches should have guardrails on the edges. Have any leaves, snow, or ice cleared regularly. Use sand or salt on walking paths during winter. Clean up any spills in your garage right away. This includes oil or grease spills. What can I do in the bathroom? Use night lights. Install grab  bars by the toilet and in the tub and shower. Do not use towel bars as grab bars. Use non-skid mats or decals in the tub or shower. If you need to sit down in the shower, use a plastic, non-slip stool. Keep the floor dry. Clean up any water that spills on the floor as soon as it happens. Remove soap buildup in the tub or shower regularly. Attach bath mats securely with double-sided non-slip rug tape. Do not have throw rugs and other things on the floor that can make you trip. What can I do in the bedroom? Use night lights. Make sure that you have a light by your bed that is easy to reach. Do not use any sheets or blankets that are too big for your bed. They should not hang down onto the floor. Have a firm chair that has side arms. You can use this for support while you get dressed. Do not have throw rugs and other things on the floor that can make you trip. What can I do in the  kitchen? Clean up any spills right away. Avoid walking on wet floors. Keep items that you use a lot in easy-to-reach places. If you need to reach something above you, use a strong step stool that has a grab bar. Keep electrical cords out of the way. Do not use floor polish or wax that makes floors slippery. If you must use wax, use non-skid floor wax. Do not have throw rugs and other things on the floor that can make you trip. What can I do with my stairs? Do not leave any items on the stairs. Make sure that there are handrails on both sides of the stairs and use them. Fix handrails that are broken or loose. Make sure that handrails are as long as the stairways. Check any carpeting to make sure that it is firmly attached to the stairs. Fix any carpet that is loose or worn. Avoid having throw rugs at the top or bottom of the stairs. If you do have throw rugs, attach them to the floor with carpet tape. Make sure that you have a light switch at the top of the stairs and the bottom of the stairs. If you do not have them,  ask someone to add them for you. What else can I do to help prevent falls? Wear shoes that: Do not have high heels. Have rubber bottoms. Are comfortable and fit you well. Are closed at the toe. Do not wear sandals. If you use a stepladder: Make sure that it is fully opened. Do not climb a closed stepladder. Make sure that both sides of the stepladder are locked into place. Ask someone to hold it for you, if possible. Clearly mark and make sure that you can see: Any grab bars or handrails. First and last steps. Where the edge of each step is. Use tools that help you move around (mobility aids) if they are needed. These include: Canes. Walkers. Scooters. Crutches. Turn on the lights when you go into a dark area. Replace any light bulbs as soon as they burn out. Set up your furniture so you have a clear path. Avoid moving your furniture around. If any of your floors are uneven, fix them. If there are any pets around you, be aware of where they are. Review your medicines with your doctor. Some medicines can make you feel dizzy. This can increase your chance of falling. Ask your doctor what other things that you can do to help prevent falls. This information is not intended to replace advice given to you by your health care provider. Make sure you discuss any questions you have with your health care provider. Document Released: 10/27/2008 Document Revised: 06/08/2015 Document Reviewed: 02/04/2014 Elsevier Interactive Patient Education  2017 [employer].

## 2022-06-12 NOTE — Progress Notes (Signed)
I,Victoria T Hamilton,acting as a scribe for Gwynneth Aliment, MD.,have documented all relevant documentation on the behalf of Gwynneth Aliment, MD,as directed by  Gwynneth Aliment, MD while in the presence of Gwynneth Aliment, MD.    Subjective:     Patient ID: Joanne Reese , female    DOB: 1945/07/12 , 77 y.o.   MRN: [id number]   Chief Complaint  Patient presents with   Hypertension   Hyperlipidemia   Hypothyroidism    HPI  Patient presents today for a  BP, chol, and thyroid check. Patient reports compliance with medications. She denies having any headaches, chest pain and shortness of breath.     She is also scheduled for AWV with [facility] Advisor today.    Hypertension This is a chronic problem. The current episode started more than 1 year ago. The problem has been rapidly improving since onset. The problem is controlled. Risk factors for coronary artery disease include dyslipidemia, post-menopausal state and sedentary lifestyle. Past treatments include calcium channel blockers. The current treatment provides moderate improvement.  Hyperlipidemia This is a chronic problem. The problem is controlled. Exacerbating diseases include hypothyroidism. She has no history of diabetes. Current antihyperlipidemic treatment includes statins. The current treatment provides moderate improvement of lipids.     Past Medical History:  Diagnosis Date   Arthritis    Depression    GERD (gastroesophageal reflux disease)    occ   Hypothyroidism      Family History  Problem Relation Age of Onset   Emphysema Father      Current Outpatient Medications:    acyclovir (ZOVIRAX) 400 MG tablet, Take 400 mg by mouth as needed (for outbreaks). 1 tablet BID as needed., Disp: , Rfl:    acyclovir cream (ZOVIRAX) 5 %, Apply 1 application topically as needed., Disp: , Rfl:    amLODipine (NORVASC) 2.5 MG tablet, Take 1 tablet (2.5 mg total) by mouth daily., Disp: 90 tablet, Rfl: 1   aspirin EC 81 MG  tablet, Take 81 mg by mouth daily. Swallow whole., Disp: , Rfl:    atorvastatin (LIPITOR) 40 MG tablet, TAKE 1 TABLET BY MOUTH EVERY DAY, Disp: 90 tablet, Rfl: 1   Biotin 19147 MCG TABS, Take by mouth. daily, Disp: , Rfl:    cefdinir (OMNICEF) 300 MG capsule, , Disp: , Rfl:    Cholecalciferol (VITAMIN D3) 50 MCG (2000 UT) capsule, Take 1 capsule by mouth daily. , Disp: , Rfl:    levothyroxine (SYNTHROID) 112 MCG tablet, Take 1 tablet (112 mcg total) by mouth daily before breakfast. (Patient taking differently: Take 112 mcg by mouth daily before breakfast. 1 pill Monday - Saturday skip sunday), Disp: 90 tablet, Rfl: 1   Allergies  Allergen Reactions   Nitrofurantoin Anaphylaxis   Pneumococcal Vaccines Other (See Comments)    LOCAL REACTION Redness & Pain around injection site  Pneumoxvac 23 (active) per patient report     Review of Systems  Constitutional: Negative.   Respiratory: Negative.    Cardiovascular: Negative.   Gastrointestinal: Negative.   Musculoskeletal: Negative.   Neurological: Negative.   Psychiatric/Behavioral: Negative.       Today's Vitals   06/12/22 1012  BP: 124/72  Pulse: 88  Temp: 97.7 F (36.5 C)  SpO2: 96%  Weight: 194 lb (88 kg)  Height: 5\' 1"  (1.549 m)   Wt Readings from Last 3 Encounters:  06/12/22 194 lb (88 kg)  06/12/22 194 lb (88 kg)  02/28/22 192 lb 3.2 oz (  87.2 kg)    Body mass index is 36.66 kg/m.  The 10-year ASCVD risk score (Arnett DK, et al., 2019) is: 19.6%   Values used to calculate the score:     Age: 53 years     Sex: Female     Is Non-Hispanic African American: No     Diabetic: No     Tobacco smoker: No     Systolic Blood Pressure: 124 mmHg     Is BP treated: Yes     HDL Cholesterol: 58 mg/dL     Total Cholesterol: 166 mg/dL ++ Objective:  Physical Exam Vitals and nursing note reviewed.  Constitutional:      Appearance: Normal appearance. She is obese.  HENT:     Head: Normocephalic and atraumatic.  Eyes:      Extraocular Movements: Extraocular movements intact.  Cardiovascular:     Rate and Rhythm: Normal rate and regular rhythm.     Heart sounds: Normal heart sounds.  Pulmonary:     Effort: Pulmonary effort is normal.     Breath sounds: Normal breath sounds.  Musculoskeletal:     Cervical back: Normal range of motion.  Skin:    General: Skin is warm.  Neurological:     General: No focal deficit present.     Mental Status: She is alert.  Psychiatric:        Mood and Affect: Mood normal.        Behavior: Behavior normal.         Assessment And Plan:     1. Hypertensive heart disease without heart failure Comments: Chronic, controlled. She will c/w amlodipine 2.5mg  daily. Advised to follow low sodium diet.  She will f/u in 4-6 months. - BMP8+eGFR  2. Atherosclerosis of aorta (HCC) Comments: Chronic, LDL goal < 70.  She agrees to c/w statin and ASA therapy.  3. Pure hypercholesterolemia Comments: We discussed her dx of Ao Atherosclerosis, LDL goal < 70. She will c/w atorvastatin 40mg  for now. Encouraged to walk 150 min/week and increase fiber intake.  4. Primary hypothyroidism Comments: Chronic, currently on levothyroxine daily M-Sat, skipping Sundays.  Last TSH 1.5 in February 2024.  5. Other abnormal glucose Comments: Previous labs reviewed, her a1c has been elevated in the past. I will recheck an a1c today. Encouraged to decrease intake of sugary beverages/foods. - Hemoglobin A1c  6. Class 2 obesity due to excess calories without serious comorbidity with body mass index (BMI) of 36.0 to 36.9 in adult She is encouraged to strive for BMI less than 30 to decrease cardiac risk. Advised to aim for at least 150 minutes of exercise per week.   Return if symptoms worsen or fail to improve, for cancel 08/29/22 appt w/ RS, 1 year AWV w thn.  Patient was given opportunity to ask questions. Patient verbalized understanding of the plan and was able to repeat key elements of the plan.  All questions were answered to their satisfaction.   I, Gwynneth Aliment, MD, have reviewed all documentation for this visit. The documentation on 06/12/22 for the exam, diagnosis, procedures, and orders are all accurate and complete.   IF YOU HAVE BEEN REFERRED TO A SPECIALIST, IT MAY TAKE 1-2 WEEKS TO SCHEDULE/PROCESS THE REFERRAL. IF YOU HAVE NOT HEARD FROM US/SPECIALIST IN TWO WEEKS, PLEASE GIVE [place] A CALL AT [phone] X [phone].   THE PATIENT IS ENCOURAGED TO PRACTICE SOCIAL DISTANCING DUE TO THE COVID-19 PANDEMIC.

## 2022-06-16 DIAGNOSIS — I119 Hypertensive heart disease without heart failure: Secondary | ICD-10-CM | POA: Insufficient documentation

## 2022-07-02 ENCOUNTER — Other Ambulatory Visit: Payer: Self-pay

## 2022-07-02 MED ORDER — DAPAGLIFLOZIN PROPANEDIOL 10 MG PO TABS: 10.0000 mg | ORAL_TABLET | Freq: Every day | ORAL | 2 refills | Status: DC

## 2022-07-10 LAB — HM DIABETES EYE EXAM

## 2022-07-17 ENCOUNTER — Telehealth: Payer: Self-pay

## 2022-07-17 ENCOUNTER — Ambulatory Visit (INDEPENDENT_AMBULATORY_CARE_PROVIDER_SITE_OTHER): Payer: Medicare Other | Admitting: Internal Medicine

## 2022-07-17 ENCOUNTER — Encounter: Payer: Self-pay | Admitting: Internal Medicine

## 2022-07-17 VITALS — BP 118/80 | HR 79 | Temp 97.9°F | Ht 61.0 in | Wt 186.4 lb

## 2022-07-17 DIAGNOSIS — E785 Hyperlipidemia, unspecified: Secondary | ICD-10-CM

## 2022-07-17 DIAGNOSIS — E1169 Type 2 diabetes mellitus with other specified complication: Secondary | ICD-10-CM | POA: Diagnosis not present

## 2022-07-17 DIAGNOSIS — Z79899 Other long term (current) drug therapy: Secondary | ICD-10-CM

## 2022-07-17 NOTE — Progress Notes (Signed)
I,Victoria T Deloria Lair, CMA,acting as a Neurosurgeon for Gwynneth Aliment, MD.,have documented all relevant documentation on the behalf of Gwynneth Aliment, MD,as directed by  Gwynneth Aliment, MD while in the presence of Gwynneth Aliment, MD.  Subjective:  Patient ID: Joanne Reese , female    DOB: November 29, 1945 , 77 y.o.   MRN: [id number]  Chief Complaint  Patient presents with   Diabetes    HPI  Patient presents today for Farxiga follow up. She was started this after her last visit, has new onset diabetes. A1c is >6.5%.  She reports compliance with medication and denies having any side effects.  She has since started to increase her daily activity as well.    She would like to discuss how this medication will drastically help her, instead of changing her diet & going from there.      Past Medical History:  Diagnosis Date   Arthritis    Depression    GERD (gastroesophageal reflux disease)    occ   Hypothyroidism      Family History  Problem Relation Age of Onset   Emphysema Father      Current Outpatient Medications:    acyclovir (ZOVIRAX) 400 MG tablet, Take 400 mg by mouth as needed (for outbreaks). 1 tablet BID as needed., Disp: , Rfl:    acyclovir cream (ZOVIRAX) 5 %, Apply 1 application topically as needed., Disp: , Rfl:    amLODipine (NORVASC) 2.5 MG tablet, Take 1 tablet (2.5 mg total) by mouth daily., Disp: 90 tablet, Rfl: 1   aspirin EC 81 MG tablet, Take 81 mg by mouth daily. Swallow whole., Disp: , Rfl:    atorvastatin (LIPITOR) 40 MG tablet, TAKE 1 TABLET BY MOUTH EVERY DAY, Disp: 90 tablet, Rfl: 1   Biotin 40981 MCG TABS, Take by mouth. daily, Disp: , Rfl:    cefdinir (OMNICEF) 300 MG capsule, , Disp: , Rfl:    Cholecalciferol (VITAMIN D3) 50 MCG (2000 UT) capsule, Take 1 capsule by mouth daily. , Disp: , Rfl:    dapagliflozin propanediol (FARXIGA) 10 MG TABS tablet, Take 1 tablet (10 mg total) by mouth daily., Disp: 30 tablet, Rfl: 2   levothyroxine (SYNTHROID) 112  MCG tablet, Take 1 tablet (112 mcg total) by mouth daily before breakfast. (Patient taking differently: Take 112 mcg by mouth daily before breakfast. 1 pill Monday - Saturday skip sunday), Disp: 90 tablet, Rfl: 1   Allergies  Allergen Reactions   Nitrofurantoin Anaphylaxis   Pneumococcal Vaccines Other (See Comments)    LOCAL REACTION Redness & Pain around injection site  Pneumoxvac 23 (active) per patient report     Review of Systems  Constitutional: Negative.   Respiratory: Negative.    Cardiovascular: Negative.   Neurological: Negative.   Psychiatric/Behavioral: Negative.       Today's Vitals   07/17/22 1128  BP: 118/80  Pulse: 79  Temp: 97.9 F (36.6 C)  SpO2: 98%  Weight: 186 lb 6.4 oz (84.6 kg)  Height: 5\' 1"  (1.549 m)   Body mass index is 35.22 kg/m.  Wt Readings from Last 3 Encounters:  07/17/22 186 lb 6.4 oz (84.6 kg)  06/12/22 194 lb (88 kg)  06/12/22 194 lb (88 kg)     Objective:  Physical Exam Vitals and nursing note reviewed.  Constitutional:      Appearance: Normal appearance.  HENT:     Head: Normocephalic and atraumatic.  Eyes:     Extraocular Movements: Extraocular movements intact.  Cardiovascular:     Rate and Rhythm: Normal rate and regular rhythm.     Heart sounds: Normal heart sounds.  Pulmonary:     Effort: Pulmonary effort is normal.     Breath sounds: Normal breath sounds.  Musculoskeletal:     Cervical back: Normal range of motion.  Skin:    General: Skin is warm.  Neurological:     General: No focal deficit present.     Mental Status: She is alert.  Psychiatric:        Mood and Affect: Mood normal.        Behavior: Behavior normal.         Assessment And Plan:  Dyslipidemia associated with type 2 diabetes mellitus (HCC) Assessment & Plan: Newly diagnosed. She has tolerated [place] without any issues. I will check f/u BMP today. I will also start PA process for insurance coverage. She will rto in 2-3 months for her next  A1c check. She was congratulated on her lifestyle changes.   Orders: -     BMP8+EGFR  Drug therapy     Return if symptoms worsen or fail to improve.  Patient was given opportunity to ask questions. Patient verbalized understanding of the plan and was able to repeat key elements of the plan. All questions were answered to their satisfaction.   I, Gwynneth Aliment, MD, have reviewed all documentation for this visit. The documentation on 07/19/22 for the exam, diagnosis, procedures, and orders are all accurate and complete.   IF YOU HAVE BEEN REFERRED TO A SPECIALIST, IT MAY TAKE 1-2 WEEKS TO SCHEDULE/PROCESS THE REFERRAL. IF YOU HAVE NOT HEARD FROM US/SPECIALIST IN TWO WEEKS, PLEASE GIVE [place] A CALL AT [phone].   THE PATIENT IS ENCOURAGED TO PRACTICE SOCIAL DISTANCING DUE TO THE COVID-19 PANDEMIC.

## 2022-07-17 NOTE — Patient Instructions (Signed)
Blood Glucose Monitoring, Adult To manage your diabetes, you will need to keep track of your blood sugar (glucose). Check your blood glucose as often as told. Keep a record of your results over time. This can help you: Know when to adjust your diabetes management plan with your health care provider. See how food, exercise, illness, and medicines affect your blood glucose. Know what your blood glucose is at any time. Your provider will set specific goals for your blood glucose levels. In many cases, these goals may be: Before meals (preprandial): 80-130 mg/dL (2.1-3.0 mmol/L). After meals (postprandial): below 180 mg/dL (10 mmol/L). A1C level: less than 7%. Supplies needed: Blood glucose meter. Test strips for your meter. Each meter has its own strips. You must use the strips that came with your meter. A needle to prick your finger (lancet). Do not use a lancet more than once. A device that holds the lancet (lancing device). A journal or logbook to write down your results. How to check your blood glucose Checking your blood glucose  Wash your hands with soap and water for at least 20 seconds. Prick the side of your finger with the lancet. Do not prick the tip of your finger. Do not use the same finger more than once. Gently rub the finger until a small drop of blood appears. Follow the instructions that came with the meter about how to insert the test strip, apply blood to the strip, and use the meter. Write down your result and any notes. Using alternative sites Some meters let you use other areas of your body (alternative sites) to test your blood. The most common places are the forearm, the thigh, and the palm of your hand. Alternative sites may not be as accurate as your fingers. The result you get may also be delayed. Use the finger only, and do not use alternative sites, if: You think you have low blood glucose (hypoglycemia). You sometimes do not know that your blood glucose is  getting low (hypoglycemia unawareness). General tips and recommendations Blood glucose log  Write down the result each time you check your blood glucose. Note anything that may be affecting your blood glucose. This can help you and your provider: Look for patterns over time. Adjust your management plan as needed. Check if your meter has an app or lets you download your records to a computer. Most meters keep a record of glucose readings in the meter. If you have type 1 diabetes: You may need to check your blood glucose 4 or more times a day. Check your blood glucose as often as told by your provider. This may include: Before each meal and snack. Two hours after a meal. Before bedtime. If you have symptoms of hypoglycemia. After treating your hypoglycemia. Before doing things that have a risk of injury, such as driving or using machinery. Before and after exercise. Between 2:00 a.m. and 3:00 a.m., as told. You may need to check your blood glucose more often, such as up to 6-10 times a day, if: You have diabetes that is not well controlled. You are ill. You have a history of severe hypoglycemia. You have hypoglycemia unawareness. If you have type 2 diabetes: You may need to check your blood glucose 2 or more times a day. Check your blood glucose as often as told by your provider. This may include: Before and after exercise. Before doing things that have a risk of injury, such as driving or using machinery. You may need to check  your blood glucose more often if: Your medicine is being adjusted. Your diabetes is not well controlled. You are ill. General tips Make sure you always have your supplies with you. After you use a few boxes of test strips, adjust (calibrate) your blood glucose meter. Follow the instructions that came with your meter. If you have questions or need help, all blood glucose meters have a 24-hour hotline phone number that you can call. Also contact your provider  with any questions or concerns. Where to find more information The American Diabetes Association: diabetes.org The Association of Diabetes Care & Education Specialists: diabeteseducator.org Contact a health care provider if: Your blood glucose is at or above 240 mg/dL (14.7 mmol/L) for 2 days in a row. You have been sick or have had a fever for 2 days or longer and are not getting better. You have any of these problems for more than 6 hours: You cannot eat or drink. You have nausea or vomiting. You have diarrhea. Get help right away if: Your blood glucose is lower than 54 mg/dL (3 mmol/L). You become confused, or you have trouble thinking clearly. You have trouble breathing. You have moderate to high ketone levels in your pee (urine). These symptoms may be an emergency. Get help right away. Call 911. Do not wait to see if the symptoms will go away. Do not drive yourself to the hospital. This information is not intended to replace advice given to you by your health care provider. Make sure you discuss any questions you have with your health care provider. Document Revised: 11/16/2021 Document Reviewed: 11/16/2021 Elsevier Patient Education  2024 [employer].

## 2022-07-17 NOTE — Telephone Encounter (Signed)
PA sent, waiting for determination.

## 2022-07-18 LAB — BMP8+EGFR
BUN/Creatinine Ratio: 13 (ref 12–28)
BUN: 11 mg/dL (ref 8–27)
CO2: 22 mmol/L (ref 20–29)
Calcium: 9.4 mg/dL (ref 8.7–10.3)
Chloride: 107 mmol/L — ABNORMAL HIGH (ref 96–106)
Creatinine, Ser: 0.86 mg/dL (ref 0.57–1.00)
Glucose: 89 mg/dL (ref 70–99)
Potassium: 4.4 mmol/L (ref 3.5–5.2)
Sodium: 143 mmol/L (ref 134–144)
eGFR: 70 mL/min/{1.73_m2} (ref >59)

## 2022-07-19 DIAGNOSIS — E1169 Type 2 diabetes mellitus with other specified complication: Secondary | ICD-10-CM | POA: Insufficient documentation

## 2022-07-19 NOTE — Assessment & Plan Note (Signed)
Newly diagnosed. She has tolerated [place] without any issues. I will check f/u BMP today. I will also start PA process for insurance coverage. She will rto in 2-3 months for her next A1c check. She was congratulated on her lifestyle changes.

## 2022-08-01 ENCOUNTER — Other Ambulatory Visit: Payer: Self-pay | Admitting: Internal Medicine

## 2022-08-06 ENCOUNTER — Telehealth: Payer: Self-pay

## 2022-08-07 ENCOUNTER — Ambulatory Visit: Payer: Self-pay | Admitting: Family Medicine

## 2022-08-08 ENCOUNTER — Encounter: Payer: Self-pay | Admitting: Family Medicine

## 2022-08-08 ENCOUNTER — Ambulatory Visit (INDEPENDENT_AMBULATORY_CARE_PROVIDER_SITE_OTHER): Payer: Medicare Other | Admitting: Family Medicine

## 2022-08-08 VITALS — BP 120/84 | HR 82 | Temp 98.1°F | Ht 61.0 in | Wt 186.0 lb

## 2022-08-08 DIAGNOSIS — Z6835 Body mass index (BMI) 35.0-35.9, adult: Secondary | ICD-10-CM

## 2022-08-08 DIAGNOSIS — R300 Dysuria: Secondary | ICD-10-CM | POA: Diagnosis not present

## 2022-08-08 DIAGNOSIS — E6609 Other obesity due to excess calories: Secondary | ICD-10-CM | POA: Diagnosis not present

## 2022-08-08 DIAGNOSIS — N898 Other specified noninflammatory disorders of vagina: Secondary | ICD-10-CM | POA: Diagnosis not present

## 2022-08-08 DIAGNOSIS — R8290 Unspecified abnormal findings in urine: Secondary | ICD-10-CM | POA: Diagnosis not present

## 2022-08-08 LAB — POCT URINALYSIS DIPSTICK
Bilirubin, UA: NEGATIVE
Blood, UA: NEGATIVE
Glucose, UA: POSITIVE — AB
Ketones, UA: NEGATIVE
Leukocytes, UA: NEGATIVE
Nitrite, UA: NEGATIVE
Protein, UA: NEGATIVE
Spec Grav, UA: 1.015 (ref 1.010–1.025)
Urobilinogen, UA: 0.2 E.U./dL
pH, UA: 5 (ref 5.0–8.0)

## 2022-08-08 MED ORDER — CLOTRIMAZOLE 1 % VA CREA
1.0000 | TOPICAL_CREAM | Freq: Every day | VAGINAL | 0 refills | Status: DC
Start: 2022-08-08 — End: 2022-08-12

## 2022-08-08 NOTE — Progress Notes (Signed)
I,Jameka J Llittleton, CMA,acting as a Neurosurgeon for [employer], NP.,have documented all relevant documentation on the behalf of Shervon Kerwin, NP,as directed by  Kodey Xue Moshe Salisbury, NP while in the presence of Lavonda Thal, NP.  Subjective:  Patient ID: Joanne Reese , female    DOB: [birth year] , 77 y.o.   MRN: [id number]  Chief Complaint  Patient presents with   Dysuria    HPI  Patient presents today for a possible yeast infection. Patient states she has been experiencing slight pain during urination ,itching around the vaginal area for the past 7 days. She denies pain, fever, discharge or any smells.  Dysuria  This is a new problem. The current episode started in the past 7 days. The problem occurs every urination. The problem has been gradually worsening. The quality of the pain is described as burning. The pain is moderate. There has been no fever. Pertinent negatives include no discharge, flank pain, frequency, hesitancy or urgency. She has tried nothing for the symptoms. The treatment provided no relief.     Past Medical History:  Diagnosis Date   Arthritis    Depression    GERD (gastroesophageal reflux disease)    occ   Hypothyroidism      Family History  Problem Relation Age of Onset   Emphysema Father      Current Outpatient Medications:    acyclovir (ZOVIRAX) 400 MG tablet, Take 400 mg by mouth as needed (for outbreaks). 1 tablet BID as needed., Disp: , Rfl:    acyclovir cream (ZOVIRAX) 5 %, Apply 1 application topically as needed., Disp: , Rfl:    amLODipine (NORVASC) 2.5 MG tablet, Take 1 tablet (2.5 mg total) by mouth daily., Disp: 90 tablet, Rfl: 1   aspirin EC 81 MG tablet, Take 81 mg by mouth daily. Swallow whole., Disp: , Rfl:    atorvastatin (LIPITOR) 40 MG tablet, TAKE 1 TABLET BY MOUTH EVERY DAY, Disp: 90 tablet, Rfl: 1   Biotin 44034 MCG TABS, Take by mouth. daily, Disp: , Rfl:    cefdinir (OMNICEF) 300 MG capsule, , Disp: , Rfl:    Cholecalciferol (VITAMIN D3)  50 MCG (2000 UT) capsule, Take 1 capsule by mouth daily. , Disp: , Rfl:    clotrimazole (GYNE-LOTRIMIN) 1 % vaginal cream, Place 1 Applicatorful vaginally at bedtime., Disp: 45 g, Rfl: 0   dapagliflozin propanediol (FARXIGA) 10 MG TABS tablet, Take 1 tablet (10 mg total) by mouth daily., Disp: 30 tablet, Rfl: 2   levothyroxine (SYNTHROID) 112 MCG tablet, Take 1 tablet (112 mcg total) by mouth daily before breakfast. (Patient taking differently: Take 112 mcg by mouth daily before breakfast. 1 pill Monday - Saturday skip sunday), Disp: 90 tablet, Rfl: 1   Allergies  Allergen Reactions   Nitrofurantoin Anaphylaxis   Pneumococcal Vaccines Other (See Comments)    LOCAL REACTION Redness & Pain around injection site  Pneumoxvac 23 (active) per patient report     Review of Systems  Constitutional: Negative.   Gastrointestinal: Negative.   Endocrine: Negative.   Genitourinary:  Positive for dysuria. Negative for flank pain, frequency, hesitancy, pelvic pain, urgency and vaginal discharge.     Today's Vitals   08/08/22 0831  BP: 120/84  Pulse: 82  Temp: 98.1 F (36.7 C)  Weight: 186 lb (84.4 kg)  Height: 5\' 1"  (1.549 m)  PainSc: 0-No pain   Body mass index is 35.14 kg/m.  Wt Readings from Last 3 Encounters:  08/08/22 186 lb (84.4 kg)  07/17/22  186 lb 6.4 oz (84.6 kg)  06/12/22 194 lb (88 kg)     Objective:  Physical Exam Abdominal:     General: Abdomen is flat.     Palpations: Abdomen is soft.     Tenderness: There is no right CVA tenderness or left CVA tenderness.  Genitourinary:    Vagina: No vaginal discharge.  Skin:    General: Skin is warm and dry.  Neurological:     Mental Status: She is alert and oriented to person, place, and time.         Assessment And Plan:  Dysuria -     POCT urinalysis dipstick  Abnormal urine -     Urine Culture  Vaginal itching -     Clotrimazole; Place 1 Applicatorful vaginally at bedtime.  Dispense: 45 g; Refill: 0  Class 2  obesity due to excess calories with body mass index (BMI) of 35.0 to 35.9 in adult, unspecified whether serious comorbidity present     Return if symptoms worsen or fail to improve, for Keep scheduled appt.  Patient was given opportunity to ask questions. Patient verbalized understanding of the plan and was able to repeat key elements of the plan. All questions were answered to their satisfaction.  Pelham Hennick Moshe Salisbury, NP  I, Sierria Bruney Moshe Salisbury, NP, have reviewed all documentation for this visit. The documentation on 08/08/22 for the exam, diagnosis, procedures, and orders are all accurate and complete.   IF YOU HAVE BEEN REFERRED TO A SPECIALIST, IT MAY TAKE 1-2 WEEKS TO SCHEDULE/PROCESS THE REFERRAL. IF YOU HAVE NOT HEARD FROM US/SPECIALIST IN TWO WEEKS, PLEASE GIVE [place] A CALL AT [phone].   THE PATIENT IS ENCOURAGED TO PRACTICE SOCIAL DISTANCING DUE TO THE COVID-19 PANDEMIC.

## 2022-08-09 ENCOUNTER — Other Ambulatory Visit: Payer: Self-pay | Admitting: Family Medicine

## 2022-08-09 DIAGNOSIS — N898 Other specified noninflammatory disorders of vagina: Secondary | ICD-10-CM

## 2022-08-13 NOTE — Telephone Encounter (Signed)
error 

## 2022-08-22 ENCOUNTER — Encounter: Payer: Self-pay | Admitting: Internal Medicine

## 2022-08-22 ENCOUNTER — Ambulatory Visit (INDEPENDENT_AMBULATORY_CARE_PROVIDER_SITE_OTHER): Payer: Medicare Other | Admitting: Internal Medicine

## 2022-08-22 VITALS — BP 110/72 | HR 78 | Temp 98.1°F | Ht 61.0 in | Wt 186.0 lb

## 2022-08-22 DIAGNOSIS — E1169 Type 2 diabetes mellitus with other specified complication: Secondary | ICD-10-CM | POA: Diagnosis not present

## 2022-08-22 DIAGNOSIS — I700 Atherosclerosis of aorta: Secondary | ICD-10-CM | POA: Diagnosis not present

## 2022-08-22 DIAGNOSIS — I119 Hypertensive heart disease without heart failure: Secondary | ICD-10-CM

## 2022-08-22 DIAGNOSIS — E785 Hyperlipidemia, unspecified: Secondary | ICD-10-CM

## 2022-08-22 DIAGNOSIS — Z0000 Encounter for general adult medical examination without abnormal findings: Secondary | ICD-10-CM

## 2022-08-22 DIAGNOSIS — Z6835 Body mass index (BMI) 35.0-35.9, adult: Secondary | ICD-10-CM

## 2022-08-22 DIAGNOSIS — E039 Hypothyroidism, unspecified: Secondary | ICD-10-CM

## 2022-08-22 NOTE — Patient Instructions (Signed)

## 2022-08-22 NOTE — Progress Notes (Unsigned)
I,Victoria T Deloria Lair, CMA,acting as a Neurosurgeon for Gwynneth Aliment, MD.,have documented all relevant documentation on the behalf of Gwynneth Aliment, MD,as directed by  Gwynneth Aliment, MD while in the presence of Gwynneth Aliment, MD.  Subjective:    Patient ID: Joanne Reese , female    DOB: 10-17-1945 , 77 y.o.   MRN: [id number]  Chief Complaint  Patient presents with   Annual Exam   Diabetes   Hypertension    HPI  Patient presents today for a physical. She reports compliance with meds. She denies headaches, chest pain and shortness of breath. She denies having any specific questions or concerns.   Hypertension This is a chronic problem. The current episode started more than 1 year ago. The problem has been rapidly improving since onset. The problem is controlled. Risk factors for coronary artery disease include dyslipidemia and post-menopausal state. Past treatments include calcium channel blockers. The current treatment provides moderate improvement.  Diabetes She presents for her follow-up diabetic visit. She has type 2 diabetes mellitus. The initial diagnosis of diabetes was made 3 months ago. Her disease course has been stable. There are no hypoglycemic associated symptoms. There are no diabetic associated symptoms. There are no hypoglycemic complications. Symptoms are stable. Risk factors for coronary artery disease include diabetes mellitus, dyslipidemia, sedentary lifestyle, post-menopausal and hypertension. Current diabetic treatment includes oral agent (monotherapy). She participates in exercise intermittently. An ACE inhibitor/angiotensin II receptor blocker is not being taken.     Past Medical History:  Diagnosis Date   Arthritis    Depression    GERD (gastroesophageal reflux disease)    occ   Hypothyroidism      Family History  Problem Relation Age of Onset   Emphysema Father      Current Outpatient Medications:    acyclovir (ZOVIRAX) 400 MG tablet, Take 400 mg  by mouth as needed (for outbreaks). 1 tablet BID as needed., Disp: , Rfl:    acyclovir cream (ZOVIRAX) 5 %, Apply 1 application topically as needed., Disp: , Rfl:    amLODipine (NORVASC) 2.5 MG tablet, Take 1 tablet (2.5 mg total) by mouth daily., Disp: 90 tablet, Rfl: 1   aspirin EC 81 MG tablet, Take 81 mg by mouth daily. Swallow whole., Disp: , Rfl:    atorvastatin (LIPITOR) 40 MG tablet, TAKE 1 TABLET BY MOUTH EVERY DAY, Disp: 90 tablet, Rfl: 1   Biotin 40981 MCG TABS, Take by mouth. daily, Disp: , Rfl:    cefdinir (OMNICEF) 300 MG capsule, , Disp: , Rfl:    Cholecalciferol (VITAMIN D3) 50 MCG (2000 UT) capsule, Take 1 capsule by mouth daily. , Disp: , Rfl:    clotrimazole (GYNE-LOTRIMIN) 1 % vaginal cream, PLACE 1 APPLICATORFUL VAGINALLY AT BEDTIME., Disp: 45 g, Rfl: 0   dapagliflozin propanediol (FARXIGA) 10 MG TABS tablet, Take 1 tablet (10 mg total) by mouth daily., Disp: 30 tablet, Rfl: 2   levothyroxine (SYNTHROID) 112 MCG tablet, Take 1 tablet (112 mcg total) by mouth daily before breakfast. (Patient taking differently: Take 112 mcg by mouth daily before breakfast. 1 pill Monday - Saturday skip sunday), Disp: 90 tablet, Rfl: 1   Allergies  Allergen Reactions   Nitrofurantoin Anaphylaxis   Pneumococcal Vaccines Other (See Comments)    LOCAL REACTION Redness & Pain around injection site  Pneumoxvac 23 (active) per patient report      The patient states she uses post menopausal status for birth control. No LMP recorded (lmp unknown). Patient  is postmenopausal.. Negative for Dysmenorrhea. Negative for: breast discharge, breast lump(s), breast pain and breast self exam. Associated symptoms include abnormal vaginal bleeding. Pertinent negatives include abnormal bleeding (hematology), anxiety, decreased libido, depression, difficulty falling sleep, dyspareunia, history of infertility, nocturia, sexual dysfunction, sleep disturbances, urinary incontinence, urinary urgency, vaginal discharge  and vaginal itching. Diet regular.The patient states her exercise level is  intermittent.  . The patient's tobacco use is:  Social History   Tobacco Use  Smoking Status Never  Smokeless Tobacco Never  . She has been exposed to passive smoke. The patient's alcohol use is:  Social History   Substance and Sexual Activity  Alcohol Use Not Currently   Comment: occasional    Review of Systems  Constitutional: Negative.   HENT: Negative.    Eyes: Negative.   Respiratory: Negative.    Cardiovascular: Negative.   Gastrointestinal: Negative.   Endocrine: Negative.   Genitourinary: Negative.   Musculoskeletal: Negative.   Skin: Negative.   Allergic/Immunologic: Negative.   Neurological: Negative.   Hematological: Negative.   Psychiatric/Behavioral: Negative.       Today's Vitals   08/22/22 0934  BP: 110/72  Pulse: 78  Temp: 98.1 F (36.7 C)  SpO2: 98%  Weight: 186 lb (84.4 kg)  Height: 5\' 1"  (1.549 m)   Body mass index is 35.14 kg/m.  Wt Readings from Last 3 Encounters:  08/22/22 186 lb (84.4 kg)  08/08/22 186 lb (84.4 kg)  07/17/22 186 lb 6.4 oz (84.6 kg)     Objective:  Physical Exam Vitals and nursing note reviewed.  Constitutional:      Appearance: Normal appearance.  HENT:     Head: Normocephalic and atraumatic.     Right Ear: Tympanic membrane, ear canal and external ear normal.     Left Ear: Tympanic membrane, ear canal and external ear normal.     Nose: Nose normal.     Mouth/Throat:     Mouth: Mucous membranes are moist.     Pharynx: Oropharynx is clear.  Eyes:     Extraocular Movements: Extraocular movements intact.     Conjunctiva/sclera: Conjunctivae normal.     Pupils: Pupils are equal, round, and reactive to light.  Cardiovascular:     Rate and Rhythm: Normal rate and regular rhythm.     Pulses: Normal pulses.     Heart sounds: Normal heart sounds.  Pulmonary:     Effort: Pulmonary effort is normal.     Breath sounds: Normal breath sounds.   Chest:  Breasts:    Tanner Score is 5.     Right: Normal.     Left: Normal.  Abdominal:     General: Abdomen is flat. Bowel sounds are normal.     Palpations: Abdomen is soft.  Genitourinary:    Comments: deferred Musculoskeletal:        General: Normal range of motion.     Cervical back: Normal range of motion and neck supple.  Skin:    General: Skin is warm and dry.  Neurological:     General: No focal deficit present.     Mental Status: She is alert and oriented to person, place, and time.  Psychiatric:        Mood and Affect: Mood normal.        Behavior: Behavior normal.         Assessment And Plan:     Encounter for general adult medical examination w/o abnormal findings Assessment & Plan: A full exam was performed.  Importance of monthly self breast exams was discussed with the patient.  PATIENT IS ADVISED TO GET 30-45 MINUTES REGULAR EXERCISE NO LESS THAN FOUR TO FIVE DAYS PER WEEK - BOTH WEIGHTBEARING EXERCISES AND AEROBIC ARE RECOMMENDED.  PATIENT IS ADVISED TO FOLLOW A HEALTHY DIET WITH AT LEAST SIX FRUITS/VEGGIES PER DAY, DECREASE INTAKE OF RED MEAT, AND TO INCREASE FISH INTAKE TO TWO DAYS PER WEEK.  MEATS/FISH SHOULD NOT BE FRIED, BAKED OR BROILED IS PREFERABLE.  IT IS ALSO IMPORTANT TO CUT BACK ON YOUR SUGAR INTAKE. PLEASE AVOID ANYTHING WITH ADDED SUGAR, CORN SYRUP OR OTHER SWEETENERS. IF YOU MUST USE A SWEETENER, YOU CAN TRY STEVIA. IT IS ALSO IMPORTANT TO AVOID ARTIFICIALLY SWEETENERS AND DIET BEVERAGES. LASTLY, I SUGGEST WEARING SPF 50 SUNSCREEN ON EXPOSED PARTS AND ESPECIALLY WHEN IN THE DIRECT SUNLIGHT FOR AN EXTENDED PERIOD OF TIME.  PLEASE AVOID FAST FOOD RESTAURANTS AND INCREASE YOUR WATER INTAKE.    Hypertensive heart disease without heart failure Assessment & Plan: Chronic, controlled. EKG performed, NSR w/o acute changes.  She will c/w amlodipine 2.5mg  daily. She is advised to follow low sodium diet.    Orders: -     POCT urinalysis dipstick -      Microalbumin / creatinine urine ratio -     EKG 12-Lead -     CBC -     AMB Referral to Pharmacy Medication Management  Atherosclerosis of aorta [facility]) Assessment & Plan: Chronic, LDL goal is less than 70.  She will continue with atorvastatin 40mg  daily. She is encouraged to follow a heart healthy lifestyle.   Orders: -     AMB Referral to Pharmacy Medication Management  Dyslipidemia associated with type 2 diabetes mellitus (HCC) Assessment & Plan: Newly diagnosed. Diabetic foot exam was performed.  I DISCUSSED WITH THE PATIENT AT LENGTH REGARDING THE GOALS OF GLYCEMIC CONTROL AND POSSIBLE LONG-TERM COMPLICATIONS.  I  ALSO STRESSED THE IMPORTANCE OF COMPLIANCE WITH HOME GLUCOSE MONITORING, DIETARY RESTRICTIONS INCLUDING AVOIDANCE OF SUGARY DRINKS/PROCESSED FOODS,  ALONG WITH REGULAR EXERCISE.  I  ALSO STRESSED THE IMPORTANCE OF ANNUAL EYE EXAMS, SELF FOOT CARE AND COMPLIANCE WITH OFFICE VISITS.   Orders: -     POCT urinalysis dipstick -     Microalbumin / creatinine urine ratio -     EKG 12-Lead -     Hemoglobin A1c -     Hepatic function panel -     AMB Referral to Pharmacy Medication Management  Primary hypothyroidism Assessment & Plan: Chronic, she will continue with Synthroid daily M-Sat. I will check thyroid panel and adjust meds as needed.   Orders: -     TSH + free T4  Class 2 severe obesity due to excess calories with serious comorbidity and body mass index (BMI) of 35.0 to 35.9 in adult [facility]) Assessment & Plan: She is encouraged to strive for BMI less than 30 to decrease cardiac risk. Advised to aim for at least 150 minutes of exercise per week.    She is encouraged to strive for BMI less than 30 to decrease cardiac risk. Advised to aim for at least 150 minutes of exercise per week.    Return for 1 year physical, 4 month DM. Patient was given opportunity to ask questions. Patient verbalized understanding of the plan and was able to repeat key elements of the  plan. All questions were answered to their satisfaction.   I, Gwynneth Aliment, MD, have reviewed all documentation for this visit. The documentation on  08/22/22 for the exam, diagnosis, procedures, and orders are all accurate and complete.

## 2022-08-29 ENCOUNTER — Other Ambulatory Visit (HOSPITAL_COMMUNITY): Payer: Self-pay

## 2022-08-29 ENCOUNTER — Other Ambulatory Visit: Payer: 59 | Admitting: Pharmacist

## 2022-08-29 ENCOUNTER — Encounter: Payer: Self-pay | Admitting: Internal Medicine

## 2022-08-29 ENCOUNTER — Ambulatory Visit: Payer: Medicare Other | Admitting: Internal Medicine

## 2022-08-29 NOTE — Progress Notes (Signed)
08/29/2022 Name: Joanne Reese MRN: [id number] DOB: August 02, 1945  Chief Complaint  Patient presents with   Medication Management   Diabetes    Joanne Reese is a 77 y.o. year old female who presented for a telephone visit.   They were referred to the pharmacist by their PCP for assistance in managing diabetes and medication access.    Subjective:  Care Team: Primary Care Provider: Dorothyann Peng, MD ; Next Scheduled Visit: 08/28/22  Medication Access/Adherence  Current Pharmacy:  [id number] - [place], [place] - [address] [place] [address] Phone: [phone] Fax: [phone]  [facility] - [place], [place] - [address] [place] [address] Phone: [phone] Fax: [phone]   Patient reports affordability concerns with their medications: Yes  Patient reports access/transportation concerns to their pharmacy: No  Patient reports adherence concerns with their medications:  No     Diabetes:  Current medications: Farxiga 10 mg daily prescribed   Current medication access support: reports they continued to receive rejections after completion of PAs  Hyperlipidemia/ASCVD Risk Reduction  Current lipid lowering medications: atorvastatin 40 mg daily  Antiplatelet regimen: aspirin 81 mg daily   Objective:  Lab Results  Component Value Date   HGBA1C 5.9 (H) 08/22/2022    Lab Results  Component Value Date   CREATININE 0.86 07/17/2022   BUN 11 07/17/2022   NA 143 07/17/2022   K 4.4 07/17/2022   CL 107 (H) 07/17/2022   CO2 22 07/17/2022    Lab Results  Component Value Date   CHOL 166 04/25/2022   HDL 58 04/25/2022   LDLCALC 89 04/25/2022   TRIG 108 04/25/2022   CHOLHDL 2.9 04/25/2022    Medications Reviewed Today     Reviewed by Joanne Reese, RPH-CPP (Pharmacist) on 08/29/22 at 1009  Med List Status: <None>   Medication Order Taking? Sig Documenting Provider Last Dose Status  Informant  acyclovir (ZOVIRAX) 400 MG tablet [id number] No Take 400 mg by mouth as needed (for outbreaks). 1 tablet BID as needed. [provider] Taking Active Self  acyclovir cream (ZOVIRAX) 5 % [id number] No Apply 1 application topically as needed. [provider] Taking Active   amLODipine (NORVASC) 2.5 MG tablet [id number] No Take 1 tablet (2.5 mg total) by mouth daily. Dorothyann Peng, MD Taking Active   aspirin EC 81 MG tablet [id number] No Take 81 mg by mouth daily. Swallow whole. [provider] Taking Active   atorvastatin (LIPITOR) 40 MG tablet [id number] No TAKE 1 TABLET BY MOUTH EVERY DAY Dorothyann Peng, MD Taking Active   Biotin 70623 MCG TABS [id number] No Take by mouth. daily [provider] Taking Active   cefdinir (OMNICEF) 300 MG capsule [id number] No  [provider] Taking Active   Cholecalciferol (VITAMIN D3) 50 MCG (2000 UT) capsule [id number] No Take 1 capsule by mouth daily.  [provider] Taking Active Self  clotrimazole (GYNE-LOTRIMIN) 1 % vaginal cream [id number] No PLACE 1 APPLICATORFUL VAGINALLY AT BEDTIME. Ellender Hose, NP Taking Active   dapagliflozin propanediol (FARXIGA) 10 MG TABS tablet [id number] No Take 1 tablet (10 mg total) by mouth daily. Dorothyann Peng, MD Taking Active   levothyroxine (SYNTHROID) 112 MCG tablet [id number] No Take 1 tablet (112 mcg total) by mouth daily before breakfast.  Patient taking differently: Take 112 mcg by mouth daily before breakfast. 1 pill Monday - Saturday skip Gayland Curry, MD Taking Active  Assessment/Plan:   Diabetes: - Currently controlled but unable to access therapy - Collaborated with pharmacy team to complete test claim. Patient's Medicare insurance prefers [facility], copay will be $177.78 per 30 day supply. Discussed with patient, she is unable to afford this. She is over income for SGLT2 patient assistance. She is not interested in adding  GLP1 at this time. Recommend to complete home supply of Marcelline Deist and stop; focus on nutrition and lifestyle modifications, follow up with labs as scheduled in November. Patient verbalizes understanding.    Hyperlipidemia/ASCVD Risk Reduction: - Currently uncontrolled, goal LDL <70 - Recommend to increase atorvastatin to 80 mg daily. Patient would like to follow labs in November before making changes. Continue current regimen at this time.   Follow Up Plan: PCP in November as scheduled  Catie Eppie Gibson, PharmD, BCACP, CPP Clinical Pharmacist [facility] [phone]

## 2022-08-31 DIAGNOSIS — Z0000 Encounter for general adult medical examination without abnormal findings: Secondary | ICD-10-CM | POA: Insufficient documentation

## 2022-08-31 NOTE — Assessment & Plan Note (Signed)
Newly diagnosed. Diabetic foot exam was performed.  I DISCUSSED WITH THE PATIENT AT LENGTH REGARDING THE GOALS OF GLYCEMIC CONTROL AND POSSIBLE LONG-TERM COMPLICATIONS.  I  ALSO STRESSED THE IMPORTANCE OF COMPLIANCE WITH HOME GLUCOSE MONITORING, DIETARY RESTRICTIONS INCLUDING AVOIDANCE OF SUGARY DRINKS/PROCESSED FOODS,  ALONG WITH REGULAR EXERCISE.  I  ALSO STRESSED THE IMPORTANCE OF ANNUAL EYE EXAMS, SELF FOOT CARE AND COMPLIANCE WITH OFFICE VISITS.

## 2022-08-31 NOTE — Assessment & Plan Note (Signed)

## 2022-08-31 NOTE — Assessment & Plan Note (Signed)
She is encouraged to strive for BMI less than 30 to decrease cardiac risk. Advised to aim for at least 150 minutes of exercise per week.  

## 2022-08-31 NOTE — Assessment & Plan Note (Signed)
Chronic, LDL goal is less than 70.  She will continue with atorvastatin 40mg  daily. She is encouraged to follow a heart healthy lifestyle.

## 2022-08-31 NOTE — Assessment & Plan Note (Signed)
Chronic, she will continue with Synthroid daily M-Sat. I will check thyroid panel and adjust meds as needed.

## 2022-08-31 NOTE — Assessment & Plan Note (Addendum)
Chronic, controlled. EKG performed, NSR w/o acute changes.  She will c/w amlodipine 2.5mg  daily. She is advised to follow low sodium diet.

## 2022-09-02 NOTE — Patient Instructions (Signed)
Joanne Reese,   It was great talking to you today!  I recommend you continue your samples of Farxiga, but you can stop when you finish that medication. We can see how your labs look in November.   If your next LDL (bad cholesterol) is >70, I recommend we increase atorvastatin to 80 mg at that time.   Please reach out with any questions!  Joanne Reese Clearance Coots, PharmD [phone]

## 2022-10-23 ENCOUNTER — Ambulatory Visit: Payer: Medicare Other | Admitting: Family Medicine

## 2022-10-23 ENCOUNTER — Encounter: Payer: Self-pay | Admitting: Family Medicine

## 2022-10-23 VITALS — BP 120/78 | Temp 98.1°F | Ht 61.0 in | Wt 180.0 lb

## 2022-10-23 DIAGNOSIS — R42 Dizziness and giddiness: Secondary | ICD-10-CM | POA: Diagnosis not present

## 2022-10-23 DIAGNOSIS — E1169 Type 2 diabetes mellitus with other specified complication: Secondary | ICD-10-CM | POA: Diagnosis not present

## 2022-10-23 DIAGNOSIS — M79651 Pain in right thigh: Secondary | ICD-10-CM

## 2022-10-23 DIAGNOSIS — E785 Hyperlipidemia, unspecified: Secondary | ICD-10-CM | POA: Diagnosis not present

## 2022-10-23 NOTE — Progress Notes (Unsigned)
I,Jameka J Llittleton, CMA,acting as a Neurosurgeon for [employer], NP.,have documented all relevant documentation on the behalf of Ellender Hose, NP,as directed by  Ellender Hose, NP while in the presence of Ellender Hose, NP.  Subjective:  Patient ID: Joanne Reese , female    DOB: 05/02/1945 , 77 y.o.   MRN: [id number]  Chief Complaint  Patient presents with  . Follow-up    HPI  Patient presents today for a urgent care follow up. She went to the urgent care on 10/03/22 because she started feeling dizzy and felt like she was going to faint so she ended up going to urgent care. At the urgent care, she had CT head to rule out stroke, Patient also complains of pain in her upper thigh on her right leg. She is reports it can be very painful and she thinks it may be due to her blood pressure medicine.     Past Medical History:  Diagnosis Date  . Arthritis   . Depression   . GERD (gastroesophageal reflux disease)    occ  . Hypothyroidism      Family History  Problem Relation Age of Onset  . Emphysema Father      Current Outpatient Medications:  .  acyclovir (ZOVIRAX) 400 MG tablet, Take 400 mg by mouth as needed (for outbreaks). 1 tablet BID as needed., Disp: , Rfl:  .  acyclovir cream (ZOVIRAX) 5 %, Apply 1 application topically as needed., Disp: , Rfl:  .  amLODipine (NORVASC) 2.5 MG tablet, Take 1 tablet (2.5 mg total) by mouth daily., Disp: 90 tablet, Rfl: 1 .  aspirin EC 81 MG tablet, Take 81 mg by mouth daily. Swallow whole., Disp: , Rfl:  .  atorvastatin (LIPITOR) 40 MG tablet, TAKE 1 TABLET BY MOUTH EVERY DAY, Disp: 90 tablet, Rfl: 1 .  Biotin 08657 MCG TABS, Take by mouth. daily, Disp: , Rfl:  .  cefdinir (OMNICEF) 300 MG capsule, , Disp: , Rfl:  .  Cholecalciferol (VITAMIN D3) 50 MCG (2000 UT) capsule, Take 1 capsule by mouth daily. , Disp: , Rfl:  .  clotrimazole (GYNE-LOTRIMIN) 1 % vaginal cream, PLACE 1 APPLICATORFUL VAGINALLY AT BEDTIME., Disp: 45 g, Rfl: 0 .  dapagliflozin  propanediol (FARXIGA) 10 MG TABS tablet, Take 1 tablet (10 mg total) by mouth daily., Disp: 30 tablet, Rfl: 2 .  levothyroxine (SYNTHROID) 112 MCG tablet, Take 1 tablet (112 mcg total) by mouth daily before breakfast. (Patient taking differently: Take 112 mcg by mouth daily before breakfast.  m-f one tab and 1/2 tab on sat. PER RS 09/03/2022), Disp: 90 tablet, Rfl: 1   Allergies  Allergen Reactions  . Nitrofurantoin Anaphylaxis  . Pneumococcal Vaccines Other (See Comments)    LOCAL REACTION Redness & Pain around injection site  Pneumoxvac 23 (active) per patient report     Review of Systems  Constitutional: Negative.   HENT: Negative.    Respiratory: Negative.    Musculoskeletal:  Positive for myalgias.     Today's Vitals   10/23/22 0951  BP: 120/78  Temp: 98.1 F (36.7 C)  Weight: 180 lb (81.6 kg)  Height: 5\' 1"  (1.549 m)  PainSc: 0-No pain   Body mass index is 34.01 kg/m.  Wt Readings from Last 3 Encounters:  10/23/22 180 lb (81.6 kg)  08/22/22 186 lb (84.4 kg)  08/08/22 186 lb (84.4 kg)    The 10-year ASCVD risk score (Arnett DK, et al., 2019) is: 32.6%   Values used to  calculate the score:     Age: 50 years     Sex: Female     Is Non-Hispanic African American: No     Diabetic: Yes     Tobacco smoker: No     Systolic Blood Pressure: 120 mmHg     Is BP treated: Yes     HDL Cholesterol: 58 mg/dL     Total Cholesterol: 166 mg/dL  Objective:  Physical Exam HENT:     Head: Normocephalic.  Cardiovascular:     Rate and Rhythm: Normal rate and regular rhythm.  Skin:    General: Skin is warm and dry.  Neurological:     General: No focal deficit present.     Mental Status: She is alert and oriented to person, place, and time.  Psychiatric:        Mood and Affect: Mood normal.        Behavior: Behavior normal.        Assessment And Plan:  Dyslipidemia associated with type 2 diabetes mellitus (HCC)  Right thigh pain  Dizziness, nonspecific -     CT HEAD WO  CONTRAST ( ); Future    No follow-ups on file.  Patient was given opportunity to ask questions. Patient verbalized understanding of the plan and was able to repeat key elements of the plan. All questions were answered to their satisfaction.    I, Ellender Hose, NP, have reviewed all documentation for this visit. The documentation on 10/23/22 for the exam, diagnosis, procedures, and orders are all accurate and complete.   IF YOU HAVE BEEN REFERRED TO A SPECIALIST, IT MAY TAKE 1-2 WEEKS TO SCHEDULE/PROCESS THE REFERRAL. IF YOU HAVE NOT HEARD FROM US/SPECIALIST IN TWO WEEKS, PLEASE GIVE [place] A CALL AT [phone] X [phone].

## 2022-10-30 DIAGNOSIS — M79651 Pain in right thigh: Secondary | ICD-10-CM | POA: Insufficient documentation

## 2022-10-30 DIAGNOSIS — R42 Dizziness and giddiness: Secondary | ICD-10-CM | POA: Insufficient documentation

## 2022-10-30 NOTE — Assessment & Plan Note (Signed)
Ct head orderd

## 2022-10-30 NOTE — Assessment & Plan Note (Signed)
Use Tylenol as needed

## 2022-10-30 NOTE — Assessment & Plan Note (Signed)
Low fat diet advised and continue Lipitor 40mg  every day.

## 2022-10-31 ENCOUNTER — Other Ambulatory Visit: Payer: Self-pay | Admitting: Internal Medicine

## 2022-11-13 ENCOUNTER — Ambulatory Visit
Admission: RE | Admit: 2022-11-13 | Discharge: 2022-11-13 | Disposition: A | Discharge status: Home / Self Care | Payer: Medicare Other | Source: Ambulatory Visit | Attending: Family Medicine | Admitting: Family Medicine

## 2022-11-13 DIAGNOSIS — R42 Dizziness and giddiness: Secondary | ICD-10-CM

## 2023-01-02 ENCOUNTER — Telehealth: Payer: Self-pay | Admitting: *Deleted

## 2023-01-02 ENCOUNTER — Ambulatory Visit: Payer: Medicare Other | Admitting: Internal Medicine

## 2023-01-02 ENCOUNTER — Encounter: Payer: Self-pay | Admitting: Internal Medicine

## 2023-01-02 VITALS — BP 120/76 | HR 90 | Temp 98.3°F | Ht 61.0 in | Wt 179.8 lb

## 2023-01-02 DIAGNOSIS — E6609 Other obesity due to excess calories: Secondary | ICD-10-CM

## 2023-01-02 DIAGNOSIS — I119 Hypertensive heart disease without heart failure: Secondary | ICD-10-CM

## 2023-01-02 DIAGNOSIS — E039 Hypothyroidism, unspecified: Secondary | ICD-10-CM | POA: Diagnosis not present

## 2023-01-02 DIAGNOSIS — E1169 Type 2 diabetes mellitus with other specified complication: Secondary | ICD-10-CM

## 2023-01-02 DIAGNOSIS — E66811 Obesity, class 1: Secondary | ICD-10-CM

## 2023-01-02 DIAGNOSIS — E785 Hyperlipidemia, unspecified: Secondary | ICD-10-CM | POA: Diagnosis not present

## 2023-01-02 DIAGNOSIS — T50Z95A Adverse effect of other vaccines and biological substances, initial encounter: Secondary | ICD-10-CM

## 2023-01-02 DIAGNOSIS — Z5986 Financial insecurity: Secondary | ICD-10-CM

## 2023-01-02 DIAGNOSIS — I700 Atherosclerosis of aorta: Secondary | ICD-10-CM

## 2023-01-02 DIAGNOSIS — L539 Erythematous condition, unspecified: Secondary | ICD-10-CM

## 2023-01-02 DIAGNOSIS — Z6833 Body mass index (BMI) 33.0-33.9, adult: Secondary | ICD-10-CM

## 2023-01-02 NOTE — Progress Notes (Signed)
Complex Care Management  Outreach Note  01/02/2023 Name: Joanne Reese MRN: [id number] DOB: 1945/10/29   Complex Care Management Outreach Attempts: An unsuccessful telephone outreach was attempted today to offer the patient information about available complex care management services.  Follow Up Plan:  Additional outreach attempts will be made to offer the patient complex care management information and services.   Encounter Outcome:  No Answer  Gwenevere Ghazi  Care Coordination Care Guide  Direct Dial: [phone]

## 2023-01-02 NOTE — Patient Instructions (Signed)

## 2023-01-02 NOTE — Progress Notes (Signed)
Complex Care Management Note   01/02/2023 Name: LILAC DESIR MRN: [id number] DOB: November 04, 1945  [name] is a 77 y.o. year old female who sees Dorothyann Peng, MD for primary care. I reached out to [employer] by phone today to offer complex care management services.  Ms. Bellinghausen was given information about Complex Care Management services today including:   The Complex Care Management services include support from the care team which includes your Nurse Coordinator, Clinical Social Worker, or Pharmacist.  The Complex Care Management team is here to help remove barriers to the health concerns and goals most important to you. Complex Care Management services are voluntary, and the patient may decline or stop services at any time by request to their care team member.   Complex Care Management Consent Status: Patient agreed to services and verbal consent obtained.   Follow up plan:  Telephone appointment with complex care management team member scheduled for:  02/19/23 and 02/26/23  Encounter Outcome:  Patient Scheduled  [facility]  Care Coordination Care Guide  Direct Dial: [phone]

## 2023-01-02 NOTE — Progress Notes (Unsigned)
I,Victoria T Deloria Lair, CMA,acting as a Neurosurgeon for Gwynneth Aliment, MD.,have documented all relevant documentation on the behalf of Gwynneth Aliment, MD,as directed by  Gwynneth Aliment, MD while in the presence of Gwynneth Aliment, MD.  Subjective:  Patient ID: Joanne Reese , female    DOB: 14-Feb-1945 , 77 y.o.   MRN: [id number]  Chief Complaint  Patient presents with  . Diabetes    HPI  Patient presents today for Farxiga follow up. She was started this after her last visit, has new onset diabetes. A1c is >6.5%.  She reports compliance with medication and denies having any side effects.  She has since started to increase her daily activity as well. However, she does not wish to take meds, prefers to work on diet/exercise.   While here today she would like her left arm looked at. She states after receiving covid & flu shots on 12/17 her arm appears red & warm to the touch.   She states she will call GI to get her colonoscopy scheduled.      Past Medical History:  Diagnosis Date  . Arthritis   . Depression   . GERD (gastroesophageal reflux disease)    occ  . Hypothyroidism      Family History  Problem Relation Age of Onset  . Emphysema Father      Current Outpatient Medications:  .  acyclovir (ZOVIRAX) 400 MG tablet, Take 400 mg by mouth as needed (for outbreaks). 1 tablet BID as needed., Disp: , Rfl:  .  acyclovir cream (ZOVIRAX) 5 %, Apply 1 application topically as needed., Disp: , Rfl:  .  amLODipine (NORVASC) 2.5 MG tablet, TAKE 1 TABLET BY MOUTH EVERY DAY, Disp: 90 tablet, Rfl: 1 .  aspirin EC 81 MG tablet, Take 81 mg by mouth daily. Swallow whole., Disp: , Rfl:  .  atorvastatin (LIPITOR) 40 MG tablet, TAKE 1 TABLET BY MOUTH EVERY DAY, Disp: 90 tablet, Rfl: 1 .  Biotin 28413 MCG TABS, Take by mouth. daily, Disp: , Rfl:  .  cefdinir (OMNICEF) 300 MG capsule, , Disp: , Rfl:  .  Cholecalciferol (VITAMIN D3) 50 MCG (2000 UT) capsule, Take 1 capsule by mouth daily. , Disp:  , Rfl:  .  clotrimazole (GYNE-LOTRIMIN) 1 % vaginal cream, PLACE 1 APPLICATORFUL VAGINALLY AT BEDTIME., Disp: 45 g, Rfl: 0 .  dapagliflozin propanediol (FARXIGA) 10 MG TABS tablet, Take 1 tablet (10 mg total) by mouth daily., Disp: 30 tablet, Rfl: 2 .  levothyroxine (SYNTHROID) 112 MCG tablet, One tab po daily M-F and 1/2 tablet on Saturdays and Sundays, Disp: 90 tablet, Rfl: 1   Allergies  Allergen Reactions  . Nitrofurantoin Anaphylaxis  . Pneumococcal Vaccines Other (See Comments)    LOCAL REACTION Redness & Pain around injection site  Pneumoxvac 23 (active) per patient report     Review of Systems  Constitutional: Negative.   Respiratory: Negative.    Cardiovascular: Negative.   Gastrointestinal: Negative.   Skin:  Positive for rash.  Neurological: Negative.   Psychiatric/Behavioral: Negative.       Today's Vitals   01/02/23 0925  BP: 120/76  Pulse: 90  Temp: 98.3 F (36.8 C)  SpO2: 98%  Weight: 179 lb 12.8 oz (81.6 kg)  Height: 5\' 1"  (1.549 m)   Body mass index is 33.97 kg/m.  Wt Readings from Last 3 Encounters:  01/02/23 179 lb 12.8 oz (81.6 kg)  10/23/22 180 lb (81.6 kg)  08/22/22 186 lb (84.4 kg)  Objective:  Physical Exam Vitals and nursing note reviewed.  Constitutional:      Appearance: Normal appearance.  HENT:     Head: Normocephalic and atraumatic.  Eyes:     Extraocular Movements: Extraocular movements intact.  Cardiovascular:     Rate and Rhythm: Normal rate and regular rhythm.     Heart sounds: Normal heart sounds.  Pulmonary:     Effort: Pulmonary effort is normal.     Breath sounds: Normal breath sounds.  Musculoskeletal:     Cervical back: Normal range of motion.  Skin:    General: Skin is warm.     Findings: Erythema present.     Comments: Erythematous/warm are on left arm, mid upr arm  Neurological:     General: No focal deficit present.     Mental Status: She is alert.  Psychiatric:        Mood and Affect: Mood normal.         Behavior: Behavior normal.        Assessment And Plan:  Dyslipidemia associated with type 2 diabetes mellitus (HCC) Assessment & Plan: Chronic, she does not wish to continue with [place]. Prefers to work on diet/exercise. She will f/u in 3 months. I will check labs as below.   Orders: -     CMP14+EGFR -     Lipid panel -     Hemoglobin A1c -     AMB Referral VBCI Care Management  Hypertensive heart disease without heart failure Assessment & Plan: Chronic, controlled. She is advised to follow low sodium diet.  She will continue with amlodipine 2.5mg  daily.     Orders: -     AMB Referral VBCI Care Management  Atherosclerosis of aorta [facility]) Assessment & Plan: Chronic, LDL goal is less than 70.  She will continue with atorvastatin 40mg  daily. She is encouraged to follow a heart healthy lifestyle.   Orders: -     AMB Referral VBCI Care Management  Primary hypothyroidism Assessment & Plan: Chronic, she will continue with Synthroid daily M-Sat. I will check thyroid panel and adjust meds as needed.   Orders: -     TSH  Vaccine reaction, initial encounter Assessment & Plan: Left upr arm with lateral area of erythema/warmth. Area is much lower than where vaccines should have been given. She will try otc Benadryl cream and apply to affected area at least twice daily. She will let me know if her sx persist/worsen.   Class 1 obesity due to excess calories with serious comorbidity and body mass index (BMI) of 33.0 to 33.9 in adult Assessment & Plan: Chronic, she was congratulated on her 7lbs weight loss since August 2024. She is encouraged to aim for at least 150 minutes of exercise per week.    Financially insecure Assessment & Plan: She agrees to [employer] referral for SW services.    She is encouraged to strive for BMI less than 30 to decrease cardiac risk. Advised to aim for at least 150 minutes of exercise per week.    Return in about 3 months (around 04/02/2023), or  dm check.  Patient was given opportunity to ask questions. Patient verbalized understanding of the plan and was able to repeat key elements of the plan. All questions were answered to their satisfaction.    I, Gwynneth Aliment, MD, have reviewed all documentation for this visit. The documentation on 01/02/23 for the exam, diagnosis, procedures, and orders are all accurate and complete.   IF YOU  HAVE BEEN REFERRED TO A SPECIALIST, IT MAY TAKE 1-2 WEEKS TO SCHEDULE/PROCESS THE REFERRAL. IF YOU HAVE NOT HEARD FROM US/SPECIALIST IN TWO WEEKS, PLEASE GIVE [place] A CALL AT [phone].   THE PATIENT IS ENCOURAGED TO PRACTICE SOCIAL DISTANCING DUE TO THE COVID-19 PANDEMIC.

## 2023-01-03 LAB — CMP14+EGFR
ALT: 24 [IU]/L (ref 0–32)
AST: 20 [IU]/L (ref 0–40)
Albumin: 4.1 g/dL (ref 3.8–4.8)
Alkaline Phosphatase: 115 [IU]/L (ref 44–121)
BUN/Creatinine Ratio: 14 (ref 12–28)
BUN: 12 mg/dL (ref 8–27)
Bilirubin Total: 0.8 mg/dL (ref 0.0–1.2)
CO2: 23 mmol/L (ref 20–29)
Calcium: 8.7 mg/dL (ref 8.7–10.3)
Chloride: 106 mmol/L (ref 96–106)
Creatinine, Ser: 0.87 mg/dL (ref 0.57–1.00)
Globulin, Total: 2.6 g/dL (ref 1.5–4.5)
Glucose: 103 mg/dL — ABNORMAL HIGH (ref 70–99)
Potassium: 4.3 mmol/L (ref 3.5–5.2)
Sodium: 143 mmol/L (ref 134–144)
Total Protein: 6.7 g/dL (ref 6.0–8.5)
eGFR: 69 mL/min/{1.73_m2} (ref >59)

## 2023-01-03 LAB — LIPID PANEL
Chol/HDL Ratio: 2.8 {ratio} (ref 0.0–4.4)
Cholesterol, Total: 143 mg/dL (ref 100–199)
HDL: 51 mg/dL (ref >39)
LDL Chol Calc (NIH): 74 mg/dL (ref 0–99)
Triglycerides: 94 mg/dL (ref 0–149)
VLDL Cholesterol Cal: 18 mg/dL (ref 5–40)

## 2023-01-03 LAB — HEMOGLOBIN A1C
Est. average glucose Bld gHb Est-mCnc: 128 mg/dL
Hgb A1c MFr Bld: 6.1 % — ABNORMAL HIGH (ref 4.8–5.6)

## 2023-01-03 LAB — TSH: TSH: 5.56 u[IU]/mL — ABNORMAL HIGH (ref 0.450–4.500)

## 2023-01-04 ENCOUNTER — Encounter: Payer: Self-pay | Admitting: Internal Medicine

## 2023-01-04 ENCOUNTER — Other Ambulatory Visit: Payer: Self-pay | Admitting: Internal Medicine

## 2023-01-04 MED ORDER — LEVOTHYROXINE SODIUM 112 MCG PO TABS: ORAL_TABLET | ORAL | 1 refills | Status: DC

## 2023-01-13 ENCOUNTER — Telehealth: Payer: Self-pay

## 2023-01-13 DIAGNOSIS — T50Z95A Adverse effect of other vaccines and biological substances, initial encounter: Secondary | ICD-10-CM | POA: Insufficient documentation

## 2023-01-13 DIAGNOSIS — Z5986 Financial insecurity: Secondary | ICD-10-CM | POA: Insufficient documentation

## 2023-01-13 NOTE — Telephone Encounter (Signed)
Patient during her visit she expressed she wanted to stop [place]. She prefers to work on diet/exercise. Patient aware we will recheck A1c in 3 months to see her progress without meds. Patient states she will give the office a call when she does stop medication. She is currently undecided.

## 2023-01-13 NOTE — Assessment & Plan Note (Signed)
Chronic, LDL goal is less than 70.  She will continue with atorvastatin 40mg  daily. She is encouraged to follow a heart healthy lifestyle.

## 2023-01-13 NOTE — Assessment & Plan Note (Signed)
Left upr arm with lateral area of erythema/warmth. Area is much lower than where vaccines should have been given. She will try otc Benadryl cream and apply to affected area at least twice daily. She will let me know if her sx persist/worsen.

## 2023-01-13 NOTE — Assessment & Plan Note (Signed)
Chronic, she does not wish to continue with [place]. Prefers to work on diet/exercise. She will f/u in 3 months. I will check labs as below.

## 2023-01-13 NOTE — Assessment & Plan Note (Addendum)
She agrees to [employer] referral for SW services.

## 2023-01-13 NOTE — Assessment & Plan Note (Signed)
Chronic, she will continue with Synthroid daily M-Sat. I will check thyroid panel and adjust meds as needed.

## 2023-01-13 NOTE — Assessment & Plan Note (Signed)
Chronic, controlled. She is advised to follow low sodium diet.  She will continue with amlodipine 2.5mg  daily.

## 2023-01-13 NOTE — Assessment & Plan Note (Signed)
Chronic, she was congratulated on her 7lbs weight loss since August 2024. She is encouraged to aim for at least 150 minutes of exercise per week.

## 2023-02-19 ENCOUNTER — Ambulatory Visit: Payer: Self-pay | Admitting: Licensed Clinical Social Worker

## 2023-02-20 NOTE — Patient Outreach (Signed)
  Care Coordination   Initial Visit Note   02/19/2023 Name: Joanne Reese MRN: [id number] DOB: [birth year]  Joanne Reese is a 78 y.o. year old female who sees Jarold Medici, MD for primary care. I spoke with  Joanne Reese by phone today.  What matters to the patients health and wellness today?  Patient agreed to r/s appt to complete initial assessment due to having to run errands.     Goals Addressed   None     SDOH assessments and interventions completed:  No     Care Coordination Interventions:  Yes, provided  Interventions Today    Flowsheet Row Most Recent Value  Chronic Disease   Chronic disease during today's visit Diabetes  General Interventions   General Interventions Discussed/Reviewed General Interventions Discussed  [LCSW introduced self and explained role in complex care management services]       Follow up plan: Follow up call scheduled for 1 week    Encounter Outcome:  Patient Visit Completed   Rolin Kerns, LCSW [place]  [facility], [facility] Clinical Social Worker Direct Dial: [phone]  Fax: [phone] Website: [email or web address] 10:34 AM

## 2023-02-20 NOTE — Patient Instructions (Signed)
 Visit Information  Thank you for taking time to visit with me today. Please don't hesitate to contact me if I can be of assistance to you.   Following are the goals we discussed today:   Goals Addressed   None     Our next appointment is by telephone on 2/12 at 10 AM  Please call the care guide team at [phone] if you need to cancel or reschedule your appointment.   If you are experiencing a Mental Health or Behavioral Health Crisis or need someone to talk to, please call the Suicide and Crisis Lifeline: 988 call 911   Patient verbalizes understanding of instructions and care plan provided today and agrees to view in MyChart. Active MyChart status and patient understanding of how to access instructions and care plan via MyChart confirmed with patient.     Joanne Reese [facility]  [facility], [facility] Clinical Social Worker Direct Dial: [phone]  Fax: [phone] Website: [email or web address] 10:34 AM

## 2023-02-24 ENCOUNTER — Other Ambulatory Visit (HOSPITAL_COMMUNITY): Payer: Self-pay

## 2023-02-24 ENCOUNTER — Other Ambulatory Visit: Payer: Self-pay

## 2023-02-24 NOTE — Progress Notes (Signed)
 02/24/2023 Name: Joanne Reese MRN: [id number] DOB: 1945/04/06  No chief complaint on file.   Joanne Reese is a 78 y.o. year old female who presented for a telephone visit.   They were referred to the pharmacist by their PCP for assistance in managing diabetes, hypertension, and hyperlipidemia.    Subjective:  Care Team: Primary Care Provider: Dorothyann Peng, MD ; Next Scheduled Visit: 04/22/2023  Medication Access/Adherence  Current Pharmacy:  [id number] - [place], [place] - [address] [place] [address] Phone: [phone] Fax: [phone]  [facility] - [place], [place] - [address] [place] [address] Phone: [phone] Fax: [phone]   Patient reports affordability concerns with their medications: Yes   Patient reports access/transportation concerns to their pharmacy: No  Patient reports adherence concerns with their medications:  No     Diabetes:  Current medications: Farxiga 10 mg daily  Patient reports she has less than a month of Farxiga samples at home. Cannot afford $180 copay for Jardiance through her insurance. Does not check her BG at home, but does have testing supplies on hand.  Patient denies hypoglycemic s/sx including dizziness, shakiness, sweating. Patient denies hyperglycemic symptoms including polyuria, polydipsia, polyphagia, nocturia, neuropathy, blurred vision.  Hypertension:  Current medications: amlodipine 2.5 mg daily  Patient confirms adherence.  Hyperlipidemia/ASCVD Risk Reduction  Current lipid lowering medications: atorvastatin 40 mg daily  Patient confirms adherence.  Objective:  Lab Results  Component Value Date   HGBA1C 6.1 (H) 01/02/2023    Lab Results  Component Value Date   CREATININE 0.87 01/02/2023   BUN 12 01/02/2023   NA 143 01/02/2023   K 4.3 01/02/2023   CL 106 01/02/2023   CO2 23 01/02/2023    Lab Results  Component Value Date   CHOL  143 01/02/2023   HDL 51 01/02/2023   LDLCALC 74 01/02/2023   TRIG 94 01/02/2023   CHOLHDL 2.8 01/02/2023    Medications Reviewed Today     Reviewed by Joanne Reese, RPH (Pharmacist) on 02/24/23 at 1648  Med List Status: <None>   Medication Order Taking? Sig Documenting Provider Last Dose Status Informant  acyclovir (ZOVIRAX) 400 MG tablet [id number]  Take 400 mg by mouth as needed (for outbreaks). 1 tablet BID as needed. [provider]  Active Self  acyclovir cream (ZOVIRAX) 5 % [id number]  Apply 1 application topically as needed. [provider]  Active   amLODipine (NORVASC) 2.5 MG tablet [id number] Yes TAKE 1 TABLET BY MOUTH EVERY DAY Joanne Peng, MD Taking Active   aspirin EC 81 MG tablet [id number]  Take 81 mg by mouth daily. Swallow whole. [provider]  Active   atorvastatin (LIPITOR) 40 MG tablet [id number] Yes TAKE 1 TABLET BY MOUTH EVERY DAY Joanne Peng, MD Taking Active   Biotin 76283 MCG TABS [id number]  Take by mouth. daily [provider]  Active   cefdinir (OMNICEF) 300 MG capsule [id number]   [provider]  Active   Cholecalciferol (VITAMIN D3) 50 MCG (2000 UT) capsule [id number]  Take 1 capsule by mouth daily.  [provider]  Active Self  clotrimazole (GYNE-LOTRIMIN) 1 % vaginal cream [id number]  PLACE 1 APPLICATORFUL VAGINALLY AT BEDTIME. Joanne Hose, NP  Active   dapagliflozin propanediol (FARXIGA) 10 MG TABS tablet [id number] Yes Take 1 tablet (10 mg total) by mouth daily. Joanne Peng, MD Taking Active   levothyroxine (SYNTHROID) 112 MCG tablet [id number]  One tab po daily  M-F and 1/2 tablet on Saturdays and Sundays Joanne Peng, MD  Active               Assessment/Plan:   Diabetes: - Currently controlled based on last A1c 6.1%, below goal <7%.  - Recommend to finish current supply of Farxiga then stop.  Patient not able to afford [place] which is preferred by her insurance with $183 copay.  Previously noted patient does not meet income criteria for Jardiance or Farxiga PAP and not interested in GLP-1s. Given well controlled A1c (never >6.5%), counseled her to monitor BG when stopping [place]. If FBG consistently >130, would recommend to start metformin. - Reviewed long term cardiovascular and renal outcomes of uncontrolled blood sugar - Reviewed goal A1c, goal fasting, and goal 2 hour post prandial glucose - Recommend to check fasting blood glucose once daily.   Hypertension: - Currently controlled based on last office visit reading - Recommend to continue amlodipine 2.5 mg daily    Hyperlipidemia/ASCVD Risk Reduction: - Currently close to goal <70 mg/dL with LDL 74 mg/dL on 43/32/95.  - Reviewed long term complications of uncontrolled cholesterol - Recommend to continue atorvastatin 40 mg daily    Follow Up Plan: PharmD on 04/07/23 and PCP on 04/22/23  Joanne Reese, PharmD PGY-1 Pharmacy Resident

## 2023-02-26 ENCOUNTER — Ambulatory Visit: Payer: Self-pay | Admitting: Licensed Clinical Social Worker

## 2023-02-26 ENCOUNTER — Other Ambulatory Visit: Payer: Medicare Other

## 2023-02-26 NOTE — Patient Outreach (Signed)
  Care Coordination   Initial Visit Note   02/26/2023 Name: Joanne Reese MRN: [id number] DOB: 02/16/1945  Joanne Reese is a 78 y.o. year old female who sees Joanne Peng, MD for primary care. I spoke with  Joanne Bear Dhillon by phone today.  What matters to the patients health and wellness today?  Symptom Management and Supportive Resources    Goals Addressed             This Visit's Progress    Obtain Supportive Resources-LCSW   On track    Activities and task to complete in order to accomplish goals.   Keep all upcoming appointments discussed today Continue with compliance of taking medication prescribed by Doctor Implement healthy coping skills discussed to assist with management of symptoms Follow up with the bank to inquire about options to assist with financial strain         SDOH assessments and interventions completed:  Yes  SDOH Interventions Today    Flowsheet Row Most Recent Value  SDOH Interventions   Food Insecurity Interventions Intervention Not Indicated  Housing Interventions Intervention Not Indicated  Transportation Interventions Intervention Not Indicated  Utilities Interventions Intervention Not Indicated        Care Coordination Interventions:  Yes, provided  Interventions Today    Flowsheet Row Most Recent Value  Chronic Disease   Chronic disease during today's visit Other  [Financial Strain]  General Interventions   General Interventions Discussed/Reviewed General Interventions Discussed, Doctor Visits, Community Resources  [employer] discussed local community agencies that may be able to assist with home improvements]  Doctor Visits Discussed/Reviewed Doctor Visits Discussed  Mental Health Interventions   Mental Health Discussed/Reviewed Mental Health Discussed, Coping Strategies  [Strategies to assist with stress management discussed]  Safety Interventions   Safety Discussed/Reviewed Safety Discussed       Follow up plan:  Follow up call scheduled for 4 weeks    Encounter Outcome:  Patient Visit Completed   Jenel Lucks, LCSW [place]  [facility], [facility] Clinical Social Worker Direct Dial: [phone]  Fax: [phone] Website: [email or web address] 10:19 AM

## 2023-02-26 NOTE — Patient Instructions (Signed)
Visit Information  Thank you for taking time to visit with me today. Please don't hesitate to contact me if I can be of assistance to you.   Following are the goals we discussed today:   Goals Addressed             This Visit's Progress    Obtain Supportive Resources-LCSW   On track    Activities and task to complete in order to accomplish goals.   Keep all upcoming appointments discussed today Continue with compliance of taking medication prescribed by Doctor Implement healthy coping skills discussed to assist with management of symptoms Follow up with the bank to inquire about options to assist with financial strain         Our next appointment is by telephone on 3/11 at 10:30 AM  Please call the care guide team at [phone] if you need to cancel or reschedule your appointment.   If you are experiencing a Mental Health or Behavioral Health Crisis or need someone to talk to, please call the Suicide and Crisis Lifeline: 988 call 911   Patient verbalizes understanding of instructions and care plan provided today and agrees to view in MyChart. Active MyChart status and patient understanding of how to access instructions and care plan via MyChart confirmed with patient.     Windy Fast [facility]  [facility], [facility] Clinical Social Worker Direct Dial: [phone]  Fax: [phone] Website: [email or web address] 10:20 AM

## 2023-03-25 ENCOUNTER — Ambulatory Visit: Payer: Self-pay | Admitting: Licensed Clinical Social Worker

## 2023-03-25 NOTE — Patient Instructions (Signed)
 Visit Information  Thank you for taking time to visit with me today. Please don't hesitate to contact me if I can be of assistance to you.   Following are the goals we discussed today:   Goals Addressed             This Visit's Progress    Obtain Supportive Resources-LCSW   On track    Activities and task to complete in order to accomplish goals.   Keep all upcoming appointments discussed today Continue with compliance of taking medication prescribed by Doctor Implement healthy coping skills discussed to assist with management of symptoms          Our next appointment is by telephone on 4/29 at 10 AM  Please call the care guide team at [phone] if you need to cancel or reschedule your appointment.   If you are experiencing a Mental Health or Behavioral Health Crisis or need someone to talk to, please call the Suicide and Crisis Lifeline: 988 call 911   Patient verbalizes understanding of instructions and care plan provided today and agrees to view in MyChart. Active MyChart status and patient understanding of how to access instructions and care plan via MyChart confirmed with patient.     Windy Fast [facility]  [facility], [facility] Clinical Social Worker Direct Dial: [phone]  Fax: [phone] Website: [email or web address] 10:20 AM

## 2023-03-25 NOTE — Patient Outreach (Signed)
 Care Coordination   Follow Up Visit Note   03/25/2023 Name: Joanne Reese MRN: [id number] DOB: 06-28-1945  Joanne Reese is a 78 y.o. year old female who sees Dorothyann Peng, MD for primary care. I spoke with  Joanne Reese by phone today.  What matters to the patients health and wellness today?  Stress Management    Goals Addressed             This Visit's Progress    Obtain Supportive Resources-LCSW   On track    Activities and task to complete in order to accomplish goals.   Keep all upcoming appointments discussed today Continue with compliance of taking medication prescribed by Doctor Implement healthy coping skills discussed to assist with management of symptoms          SDOH assessments and interventions completed:  No     Care Coordination Interventions:  Yes, provided  Interventions Today    Flowsheet Row Most Recent Value  Chronic Disease   Chronic disease during today's visit Diabetes, Other  [Financially Insecure]  General Interventions   General Interventions Discussed/Reviewed General Interventions Reviewed, Doctor Visits  Doctor Visits Discussed/Reviewed Doctor Visits Reviewed  Mental Health Interventions   Mental Health Discussed/Reviewed Mental Health Reviewed, Coping Strategies, Anxiety  [Patient reports a decrease in stress noting she was successful in securing loan with bank. Pt identified stress due to political climate. Validation and Encouragement provided. Self-care strategies discussed]       Follow up plan: Follow up call scheduled for 4-6 weeks    Encounter Outcome:  Patient Visit Completed   Jenel Lucks, LCSW [place]  [facility], [facility] Clinical Social Worker Direct Dial: [phone]  Fax: [phone] Website: [email or web address] 10:20 AM

## 2023-04-07 ENCOUNTER — Other Ambulatory Visit: Payer: Self-pay | Admitting: Pharmacist

## 2023-04-07 DIAGNOSIS — R7309 Other abnormal glucose: Secondary | ICD-10-CM

## 2023-04-07 NOTE — Progress Notes (Signed)
 04/07/2023 Name: Joanne Reese MRN: [id number] DOB: 04-Jun-1945  Chief Complaint  Patient presents with   Medication Management    Prediabetes     Joanne Reese is a 78 y.o. year old female who presented for a telephone visit.   They were referred to the pharmacist by their PCP for assistance in managing prediabetes.    Subjective:  Care Team: Primary Care Provider: Dorothyann Peng, MD ; Next Scheduled Visit: 04/22/2023  Medication Access/Adherence  Current Pharmacy:  [id number] - [place], [place] - [address] [place] [address] Phone: [phone] Fax: [phone]  [facility] - [place], [place] - [address] [place] [address] Phone: [phone] Fax: [phone]   Patient reports affordability concerns with their medications: Yes  Marcelline Deist --She was receiving samples but decided to discontinue the medication on her own.  Patient reports access/transportation concerns to their pharmacy: No  Patient reports adherence concerns with their medications:  No   --Patient reported that she decided to stop taking [place] due to cost and to see how her blood sugars would do without medication    Diabetes:  Current medications:  Farxiga 10 mg 1 tablet weekly--Patient is not taking this  Current glucose readings: Is not checking her blood sugars--said she will read up on how to do it and then test her blood sugars.  She was just getting over the flu when we spoke and said she did not want to try to do it while we were speaking.  Patient denies hypoglycemic s/sx including  dizziness, shakiness, sweating. Patient denies hyperglycemic symptoms including  polyuria, polydipsia, polyphagia, nocturia, neuropathy, blurred vision.  Current meal patterns:  - Breakfast: eggs - Lunch piece of fruit - Supper chicken, fish - Snacks fruit - Drinks water, sugar free sparkling water, diet soda, unsweet ice tea  Current  physical activity: walking--has not in two weeks since she had the flu recently also needs a knee replacement.  Objective:  Lab Results  Component Value Date   HGBA1C 6.1 (H) 01/02/2023    Lab Results  Component Value Date   CREATININE 0.87 01/02/2023   BUN 12 01/02/2023   NA 143 01/02/2023   K 4.3 01/02/2023   CL 106 01/02/2023   CO2 23 01/02/2023    Lab Results  Component Value Date   CHOL 143 01/02/2023   HDL 51 01/02/2023   LDLCALC 74 01/02/2023   TRIG 94 01/02/2023   CHOLHDL 2.8 01/02/2023    Medications Reviewed Today     Reviewed by Beecher Mcardle, RPH (Pharmacist) on 04/07/23 at 1347  Med List Status: <None>   Medication Order Taking? Sig Documenting Provider Last Dose Status Informant  acyclovir (ZOVIRAX) 400 MG tablet [id number] Yes Take 400 mg by mouth as needed (for outbreaks). 1 tablet BID as needed. [provider] Taking Active Self  acyclovir cream (ZOVIRAX) 5 % [id number] Yes Apply 1 application topically as needed. [provider] Taking Active   amLODipine (NORVASC) 2.5 MG tablet [id number] Yes TAKE 1 TABLET BY MOUTH EVERY DAY Dorothyann Peng, MD Taking Active   aspirin EC 81 MG tablet [id number] Yes Take 81 mg by mouth daily. Swallow whole. [provider] Taking Active   atorvastatin (LIPITOR) 40 MG tablet [id number] Yes TAKE 1 TABLET BY MOUTH EVERY DAY Dorothyann Peng, MD Taking Active   Biotin 85462 MCG TABS [id number] Yes Take by mouth. daily [provider] Taking Active   Cholecalciferol (VITAMIN D3) 50 MCG (  2000 UT) capsule [id number] Yes Take 1 capsule by mouth daily.  [provider] Taking Active Self  clotrimazole (GYNE-LOTRIMIN) 1 % vaginal cream [id number] Yes PLACE 1 APPLICATORFUL VAGINALLY AT BEDTIME. Ellender Hose, NP Taking Active   dapagliflozin propanediol (FARXIGA) 10 MG TABS tablet [id number] No Take 1 tablet (10 mg total) by mouth daily.  Patient not taking: Reported on 04/07/2023   Dorothyann Peng, MD  Not Taking Active   levothyroxine (SYNTHROID) 112 MCG tablet [id number] Yes One tab po daily M-F and 1/2 tablet on Saturdays and Sundays Dorothyann Peng, MD Taking Active   promethazine-dextromethorphan (PROMETHAZINE-DM) 6.25-15 MG/5ML syrup [id number] No Take 5 mLs by mouth every 6 (six) hours as needed.  Patient not taking: Reported on 04/07/2023   [provider] Not Taking Active               Assessment/Plan:   Diabetes: - Currently controlled A1c 6.1%--Prediabetes? - Reviewed long term cardiovascular and renal outcomes of uncontrolled blood sugar - Reviewed goal A1c, goal fasting, and goal 2 hour post prandial glucose - - Recommend to check glucose at least once daily so she will have a better idea of what her blood sugars are .  - Meets financial criteria for Farxiga patient assistance program through [employer]. If patient decides to continue therapy with Farxiga--will collaborate with provider, CPhT, and patient to pursue assistance  Hypertension: - Currently controlled 120/76 - Recommend to continue current therapy    Hyperlipidemia/ASCVD Risk Reduction: - Currently controlled.  - Recommend to continue current therapy  Follow Up Plan:    Call patient back in 2 weeks to see if she was able to operate her blood glucose meter.  Can trouble shoot the meter she has or get her a new one if necessary.   Beecher Mcardle, PharmD, BCACP Clinical Pharmacist [phone]

## 2023-04-22 ENCOUNTER — Ambulatory Visit: Payer: Medicare Other | Admitting: Internal Medicine

## 2023-04-24 ENCOUNTER — Ambulatory Visit (INDEPENDENT_AMBULATORY_CARE_PROVIDER_SITE_OTHER): Admitting: Internal Medicine

## 2023-04-24 VITALS — BP 128/78 | HR 85 | Temp 98.2°F | Ht 61.0 in | Wt 190.6 lb

## 2023-04-24 DIAGNOSIS — Z6836 Body mass index (BMI) 36.0-36.9, adult: Secondary | ICD-10-CM

## 2023-04-24 DIAGNOSIS — I700 Atherosclerosis of aorta: Secondary | ICD-10-CM | POA: Diagnosis not present

## 2023-04-24 DIAGNOSIS — S20361A Insect bite (nonvenomous) of right front wall of thorax, initial encounter: Secondary | ICD-10-CM | POA: Insufficient documentation

## 2023-04-24 DIAGNOSIS — E7800 Pure hypercholesterolemia, unspecified: Secondary | ICD-10-CM | POA: Diagnosis not present

## 2023-04-24 DIAGNOSIS — R7303 Prediabetes: Secondary | ICD-10-CM

## 2023-04-24 DIAGNOSIS — I119 Hypertensive heart disease without heart failure: Secondary | ICD-10-CM

## 2023-04-24 DIAGNOSIS — E66812 Obesity, class 2: Secondary | ICD-10-CM | POA: Insufficient documentation

## 2023-04-24 DIAGNOSIS — E2839 Other primary ovarian failure: Secondary | ICD-10-CM

## 2023-04-24 DIAGNOSIS — E039 Hypothyroidism, unspecified: Secondary | ICD-10-CM

## 2023-04-24 DIAGNOSIS — G5602 Carpal tunnel syndrome, left upper limb: Secondary | ICD-10-CM

## 2023-04-24 DIAGNOSIS — W57XXXA Bitten or stung by nonvenomous insect and other nonvenomous arthropods, initial encounter: Secondary | ICD-10-CM

## 2023-04-24 NOTE — Progress Notes (Signed)
 I,Victoria T Basil Lim, CMA,acting as a Neurosurgeon for Smiley Dung, MD.,have documented all relevant documentation on the behalf of Smiley Dung, MD,as directed by  Smiley Dung, MD while in the presence of Smiley Dung, MD.  Subjective:  Patient ID: Joanne Reese , female    DOB: December 06, 1945 , 78 y.o.   MRN: [id number]  Chief Complaint  Patient presents with   Diabetes    Patient presents today for bp, diabetes & cholesterol follow up. She reports compliance with medications. Denies headache, chest pain & sob. She reports stopping Farxiga  in February. Wanting to take the natural route.    Hypertension   Hyperlipidemia    HPI Discussed the use of AI scribe software for clinical note transcription with the patient, who gave verbal consent to proceed.  History of Present Illness Joanne Reese is a 78 year old female who presents for follow-up and lab work.  She discontinued Farxiga  on March 27th due to insurance issues. Routine lab work is scheduled today, including kidney function and A1c, which are typically checked every three to four months. She also requests a thyroid  panel due to feeling tired, which is usually done every six months.  She has a history of carpal tunnel syndrome in both hands, with the left hand being more severely affected. She experiences severe pain and numbness, particularly at night, which disrupts her sleep. She uses wrist splints at night and has undergone a nerve conduction study. Surgery is planned for the left hand, likely in June after her trip. She reports difficulty with tasks involving twisting motions, such as opening jars, and notes that the condition began with intense itching in her hand.  She experienced a severe flu around [place]. [place] despite having received a flu shot. She suspects she contracted the flu from a friend during a trip to [facility]. Initially, she thought she had COVID due to symptoms of coughing and feeling  unwell, but urgent care testing confirmed it was the flu.  She mentions a red spot on her skin, initially thought to be a mosquito bite, but it has persisted for a few days without itching. She has not applied any treatment to it yet.     Hypertension This is a chronic problem. The current episode started more than 1 year ago. The problem has been rapidly improving since onset. The problem is controlled. Risk factors for coronary artery disease include dyslipidemia, post-menopausal state and sedentary lifestyle. Past treatments include calcium  channel blockers. The current treatment provides moderate improvement.  Hyperlipidemia This is a chronic problem. The problem is controlled. Exacerbating diseases include hypothyroidism. She has no history of diabetes. Current antihyperlipidemic treatment includes statins. The current treatment provides moderate improvement of lipids.     Past Medical History:  Diagnosis Date   Arthritis    Depression    GERD (gastroesophageal reflux disease)    occ   Hypothyroidism      Family History  Problem Relation Age of Onset   Emphysema Father      Current Outpatient Medications:    acyclovir (ZOVIRAX) 400 MG tablet, Take 400 mg by mouth as needed (for outbreaks). 1 tablet BID as needed., Disp: , Rfl:    acyclovir cream (ZOVIRAX) 5 %, Apply 1 application topically as needed., Disp: , Rfl:    amLODipine  (NORVASC ) 2.5 MG tablet, TAKE 1 TABLET BY MOUTH EVERY DAY, Disp: 90 tablet, Rfl: 1   aspirin  EC 81 MG tablet, Take 81 mg by  mouth daily. Swallow whole., Disp: , Rfl:    atorvastatin  (LIPITOR) 40 MG tablet, TAKE 1 TABLET BY MOUTH EVERY DAY, Disp: 90 tablet, Rfl: 1   Biotin 10000 MCG TABS, Take by mouth. daily, Disp: , Rfl:    Cholecalciferol (VITAMIN D3) 50 MCG (2000 UT) capsule, Take 1 capsule by mouth daily. , Disp: , Rfl:    clotrimazole  (GYNE-LOTRIMIN ) 1 % vaginal cream, PLACE 1 APPLICATORFUL VAGINALLY AT BEDTIME., Disp: 45 g, Rfl: 0    levothyroxine  (SYNTHROID ) 112 MCG tablet, One tab po daily M-F and 1/2 tablet on Saturdays and Sundays, Disp: 90 tablet, Rfl: 1   Allergies  Allergen Reactions   Nitrofurantoin Anaphylaxis   Pneumococcal Vaccines Other (See Comments)    LOCAL REACTION Redness & Pain around injection site  Pneumoxvac 23 (active) per patient report     Review of Systems  Constitutional: Negative.   Respiratory: Negative.    Cardiovascular: Negative.   Gastrointestinal: Negative.   Neurological: Negative.   Psychiatric/Behavioral: Negative.       Today's Vitals   04/24/23 0919  BP: 128/78  Pulse: 85  Temp: 98.2 F (36.8 C)  SpO2: 98%  Weight: 190 lb 9.6 oz (86.5 kg)  Height: 5\' 1"  (1.549 m)   Body mass index is 36.01 kg/m.  Wt Readings from Last 3 Encounters:  04/24/23 190 lb 9.6 oz (86.5 kg)  01/02/23 179 lb 12.8 oz (81.6 kg)  10/23/22 180 lb (81.6 kg)     Objective:  Physical Exam Vitals and nursing note reviewed.  Constitutional:      Appearance: Normal appearance.  HENT:     Head: Normocephalic and atraumatic.  Eyes:     Extraocular Movements: Extraocular movements intact.  Cardiovascular:     Rate and Rhythm: Normal rate and regular rhythm.     Heart sounds: Normal heart sounds.  Pulmonary:     Effort: Pulmonary effort is normal.     Breath sounds: Normal breath sounds.  Musculoskeletal:     Cervical back: Normal range of motion.  Skin:    General: Skin is warm.     Comments: Round erythematous papule right anterior chest  Neurological:     General: No focal deficit present.     Mental Status: She is alert.  Psychiatric:        Mood and Affect: Mood normal.        Behavior: Behavior normal.         Assessment And Plan:  Hypertensive heart disease without heart failure Assessment & Plan: Chronic, controlled. She is advised to follow low sodium diet.  She will continue with amlodipine  2.5mg  daily.     Orders: -     CMP14+EGFR -     Lipid  panel  Atherosclerosis of aorta (HCC) Assessment & Plan: Chronic, LDL goal is less than 70.  She will continue with atorvastatin  40mg  daily. She is encouraged to follow a heart healthy lifestyle.   Orders: -     Lipid panel  Prediabetes Assessment & Plan: She has had one A1c 6.5 or higher, she does not wish to take Farxiga  due to insurance issues. Will consider another medication if she has another A1c greater than 6.5.  - Encouraged to exercise regularly and follow a diet free of refined carbs  Orders: -     CMP14+EGFR -     Hemoglobin A1c  Pure hypercholesterolemia Assessment & Plan: Chronic, she will continue with atorvastatin  40mg  daily. Encouraged to follow heart healthy lifestyle.  Orders: -     CMP14+EGFR -     Lipid panel  Primary hypothyroidism Assessment & Plan: Chronic, she will continue with Synthroid  112mcg daily M-Sat. I will check thyroid  panel and adjust meds as needed.   Orders: -     TSH + free T4  Insect bite of right front wall of thorax, initial encounter Assessment & Plan: Red spot suspected insect bite, possibly spider. No itching or other symptoms. - Apply OTC hydrocortisone cream twice daily.   Carpal tunnel syndrome of left wrist Assessment & Plan: Severe left hand carpal tunnel syndrome with numbness and pain. Surgery planned post-June. Right hand less severe. Discussed symptom progression if untreated. - Ensure records from [facility] for surgical clearance. - Schedule left hand surgery when ready.   Estrogen deficiency Assessment & Plan: She agrees to bone density. She would like this to be scheduled at [facility] in [place].   Class 2 severe obesity due to excess calories with serious comorbidity and body mass index (BMI) of 36.0 to 36.9 in adult [facility]) Assessment & Plan: She is encouraged to strive for BMI less than 30 to decrease cardiac risk. Advised to aim for at least 150 minutes of exercise per  week.     Return if symptoms worsen or fail to improve, for cancel June appt w/ RS, make AWV phone.  Patient was given opportunity to ask questions. Patient verbalized understanding of the plan and was able to repeat key elements of the plan. All questions were answered to their satisfaction.    I, Smiley Dung, MD, have reviewed all documentation for this visit. The documentation on 04/24/23 for the exam, diagnosis, procedures, and orders are all accurate and complete.   IF YOU HAVE BEEN REFERRED TO A SPECIALIST, IT MAY TAKE 1-2 WEEKS TO SCHEDULE/PROCESS THE REFERRAL. IF YOU HAVE NOT HEARD FROM US /SPECIALIST IN TWO WEEKS, PLEASE GIVE US  A CALL AT [phone].   THE PATIENT IS ENCOURAGED TO PRACTICE SOCIAL DISTANCING DUE TO THE COVID-19 PANDEMIC.

## 2023-04-24 NOTE — Patient Instructions (Signed)

## 2023-04-25 ENCOUNTER — Encounter: Payer: Self-pay | Admitting: Internal Medicine

## 2023-04-25 LAB — CMP14+EGFR
ALT: 22 IU/L (ref 0–32)
AST: 21 IU/L (ref 0–40)
Albumin: 4.1 g/dL (ref 3.8–4.8)
Alkaline Phosphatase: 123 IU/L — ABNORMAL HIGH (ref 44–121)
BUN/Creatinine Ratio: 24 (ref 12–28)
BUN: 18 mg/dL (ref 8–27)
Bilirubin Total: 0.6 mg/dL (ref 0.0–1.2)
CO2: 23 mmol/L (ref 20–29)
Calcium: 8.9 mg/dL (ref 8.7–10.3)
Chloride: 105 mmol/L (ref 96–106)
Creatinine, Ser: 0.76 mg/dL (ref 0.57–1.00)
Globulin, Total: 2.4 g/dL (ref 1.5–4.5)
Glucose: 109 mg/dL — ABNORMAL HIGH (ref 70–99)
Potassium: 4.4 mmol/L (ref 3.5–5.2)
Sodium: 142 mmol/L (ref 134–144)
Total Protein: 6.5 g/dL (ref 6.0–8.5)
eGFR: 81 mL/min/{1.73_m2} (ref >59)

## 2023-04-25 LAB — LIPID PANEL
Chol/HDL Ratio: 3 ratio (ref 0.0–4.4)
Cholesterol, Total: 164 mg/dL (ref 100–199)
HDL: 55 mg/dL (ref >39)
LDL Chol Calc (NIH): 94 mg/dL (ref 0–99)
Triglycerides: 81 mg/dL (ref 0–149)
VLDL Cholesterol Cal: 15 mg/dL (ref 5–40)

## 2023-04-25 LAB — TSH+FREE T4
Free T4: 1.47 ng/dL (ref 0.82–1.77)
TSH: 2.26 u[IU]/mL (ref 0.450–4.500)

## 2023-04-25 LAB — HEMOGLOBIN A1C
Est. average glucose Bld gHb Est-mCnc: 128 mg/dL
Hgb A1c MFr Bld: 6.1 % — ABNORMAL HIGH (ref 4.8–5.6)

## 2023-04-30 ENCOUNTER — Telehealth: Payer: Self-pay | Admitting: Pharmacist

## 2023-04-30 DIAGNOSIS — E1169 Type 2 diabetes mellitus with other specified complication: Secondary | ICD-10-CM

## 2023-04-30 NOTE — Progress Notes (Signed)
   04/30/2023 Name: Joanne Reese MRN: [id number] DOB: 1945-06-06  Chief Complaint  Patient presents with   Medication Management    Diabetes     Joanne Reese is a 78 y.o. year old female who presented for a telephone visit.   They were referred to the pharmacist by their PCP for assistance in managing diabetes.    Subjective:  Care Team: Primary Care Provider: Dorothyann Peng, MD ; Next Scheduled Visit: 08/28/23  Medication Access/Adherence  Current Pharmacy:  [id number] - [place], [place] - [address] [place] [address] Phone: [phone] Fax: [phone]  [facility] - [place], [place] - [address] [place] [address] Phone: [phone] Fax: [phone]   Objective:  Lab Results  Component Value Date   HGBA1C 6.1 (H) 04/24/2023    Lab Results  Component Value Date   CREATININE 0.76 04/24/2023   BUN 18 04/24/2023   NA 142 04/24/2023   K 4.4 04/24/2023   CL 105 04/24/2023   CO2 23 04/24/2023    Lab Results  Component Value Date   CHOL 164 04/24/2023   HDL 55 04/24/2023   LDLCALC 94 04/24/2023   TRIG 81 04/24/2023   CHOLHDL 3.0 04/24/2023    Medications Reviewed Today     Reviewed by Beecher Mcardle, RPH (Pharmacist) on 04/30/23 at 1037  Med List Status: <None>   Medication Order Taking? Sig Documenting Provider Last Dose Status Informant  acyclovir (ZOVIRAX) 400 MG tablet [id number] Yes Take 400 mg by mouth as needed (for outbreaks). 1 tablet BID as needed. [provider] Taking Active Self  acyclovir cream (ZOVIRAX) 5 % [id number] Yes Apply 1 application topically as needed. [provider] Taking Active   amLODipine (NORVASC) 2.5 MG tablet [id number] Yes TAKE 1 TABLET BY MOUTH EVERY DAY Dorothyann Peng, MD Taking Active   aspirin EC 81 MG tablet [id number] Yes Take 81 mg by mouth daily. Swallow whole. [provider] Taking Active   atorvastatin (LIPITOR) 40 MG  tablet [id number] Yes TAKE 1 TABLET BY MOUTH EVERY DAY Dorothyann Peng, MD Taking Active   Biotin 62376 MCG TABS [id number] Yes Take by mouth. daily [provider] Taking Active   Cholecalciferol (VITAMIN D3) 50 MCG (2000 UT) capsule [id number] Yes Take 1 capsule by mouth daily.  [provider] Taking Active Self  clotrimazole (GYNE-LOTRIMIN) 1 % vaginal cream [id number] Yes PLACE 1 APPLICATORFUL VAGINALLY AT BEDTIME. Ellender Hose, NP Taking Active   levothyroxine (SYNTHROID) 112 MCG tablet [id number] Yes One tab po daily M-F and 1/2 tablet on Saturdays and Sundays Dorothyann Peng, MD Taking Active               Assessment/Plan:   Diabetes: Spoke with Patient. She reported not taking [place] since late January/early February.  Said she is not checking her blood sugars and that they checked in the office. Her A1c is 6.1%.    - Currently controlled 6.1% (Prediabetes) NOT TAKING FARXIGA as of 02/25 - Recommend to continue lifestyle modifications  -Patient seen by PCP on 04/25/23 A1c 6.1% LDL -94  Follow Up Plan:    Will close Pharmacy case.  Will gladly reopen upon request or Patient need.    Beecher Mcardle, PharmD, BCACP Clinical Pharmacist [phone]  .

## 2023-05-02 NOTE — Assessment & Plan Note (Signed)
Chronic, she will continue with Synthroid daily M-Sat. I will check thyroid panel and adjust meds as needed.

## 2023-05-02 NOTE — Assessment & Plan Note (Signed)
 She agrees to bone density. She would like this to be scheduled at [facility] in [place].

## 2023-05-02 NOTE — Assessment & Plan Note (Signed)
 She has had one A1c 6.5 or higher, she does not wish to take Farxiga  due to insurance issues. Will consider another medication if she has another A1c greater than 6.5.  - Encouraged to exercise regularly and follow a diet free of refined carbs

## 2023-05-02 NOTE — Assessment & Plan Note (Signed)
 Chronic, controlled. She is advised to follow low sodium diet.  She will continue with amlodipine 2.5mg  daily.

## 2023-05-02 NOTE — Assessment & Plan Note (Signed)
 Chronic, she will continue with atorvastatin  40mg  daily. Encouraged to follow heart healthy lifestyle.

## 2023-05-02 NOTE — Assessment & Plan Note (Signed)
Chronic, LDL goal is less than 70.  She will continue with atorvastatin 40mg  daily. She is encouraged to follow a heart healthy lifestyle.

## 2023-05-02 NOTE — Assessment & Plan Note (Signed)
 Severe left hand carpal tunnel syndrome with numbness and pain. Surgery planned post-June. Right hand less severe. Discussed symptom progression if untreated. - Ensure records from [facility] for surgical clearance. - Schedule left hand surgery when ready.

## 2023-05-02 NOTE — Assessment & Plan Note (Signed)
 Red spot suspected insect bite, possibly spider. No itching or other symptoms. - Apply OTC hydrocortisone cream twice daily.

## 2023-05-02 NOTE — Assessment & Plan Note (Signed)
 She is encouraged to strive for BMI less than 30 to decrease cardiac risk. Advised to aim for at least 150 minutes of exercise per week.

## 2023-05-03 ENCOUNTER — Other Ambulatory Visit: Payer: Self-pay | Admitting: Internal Medicine

## 2023-05-06 ENCOUNTER — Other Ambulatory Visit: Payer: Self-pay | Admitting: Internal Medicine

## 2023-05-13 ENCOUNTER — Ambulatory Visit: Payer: Self-pay | Admitting: Licensed Clinical Social Worker

## 2023-05-14 NOTE — Patient Outreach (Signed)
 Complex Care Management   Visit Note  05/13/2023  Name:  Joanne Reese MRN: [id number] DOB: 1945/12/24  Situation: Referral received for Complex Care Management related to  Stress Management  I obtained verbal consent from Patient.  Visit completed with pt  on the phone  Background:   Past Medical History:  Diagnosis Date   Arthritis    Depression    GERD (gastroesophageal reflux disease)    occ   Hypothyroidism     Assessment: Patient Reported Symptoms:  Cognitive        Neurological      HEENT        Cardiovascular      Respiratory      Endocrine      Gastrointestinal        Genitourinary      Integumentary      Musculoskeletal          Psychosocial              04/24/2023    9:19 AM  Depression screen PHQ 2/9  Decreased Interest 0  Down, Depressed, Hopeless 0  PHQ - 2 Score 0  Altered sleeping 0  Tired, decreased energy 0  Change in appetite 0  Feeling bad or failure about yourself  0  Trouble concentrating 0  Moving slowly or fidgety/restless 0  Suicidal thoughts 0  PHQ-9 Score 0  Difficult doing work/chores Not difficult at all    There were no vitals filed for this visit.  Medications Reviewed Today   Medications were not reviewed in this encounter     Recommendation:   Contact PCP if additional needs arise  Follow Up Plan:   Closing From:  Complex Care Management  Alease Hunter, LCSW [place]  [facility], [facility] Clinical Social Worker Direct Dial: [phone]  Fax: [phone] Website: [email or web address] 3:12 PM

## 2023-05-14 NOTE — Patient Instructions (Signed)
 Visit Information  Thank you for taking time to visit with me today. Please don't hesitate to contact me if I can be of assistance to you before our next scheduled appointment.   Closing From: Complex Care Management.  Please call the care guide team at [phone] if you need to cancel, schedule, or reschedule an appointment.   Please call the Suicide and Crisis Lifeline: 988 go to [facility] [address] [place] [phone]) call 911 if you are experiencing a Mental Health or Behavioral Health Crisis or need someone to talk to.  Alease Hunter, LCSW [place]  [facility], [facility] Clinical Social Worker Direct Dial: [phone]  Fax: [phone] Website: [email or web address] 3:13 PM

## 2023-06-18 ENCOUNTER — Ambulatory Visit: Payer: Medicare Other

## 2023-06-18 ENCOUNTER — Ambulatory Visit: Payer: Self-pay | Admitting: Internal Medicine

## 2023-06-18 DIAGNOSIS — Z0000 Encounter for general adult medical examination without abnormal findings: Secondary | ICD-10-CM | POA: Diagnosis not present

## 2023-06-18 NOTE — Progress Notes (Signed)
 Subjective:   Joanne Reese is a 78 y.o. who presents for a Medicare Wellness preventive visit.  As a reminder, Annual Wellness Visits don't include a physical exam, and some assessments may be limited, especially if this visit is performed virtually. We may recommend an in-person follow-up visit with your provider if needed.  Visit Complete: Virtual I connected with  Joanne Reese on 06/18/23 by a audio enabled telemedicine application and verified that I am speaking with the correct person using two identifiers.  Patient Location: Home  Provider Location: Office/Clinic  I discussed the limitations of evaluation and management by telemedicine. The patient expressed understanding and agreed to proceed.  Vital Signs: Because this visit was a virtual/telehealth visit, some criteria may be missing or patient reported. Any vitals not documented were not able to be obtained and vitals that have been documented are patient reported.  VideoError- Librarian, academic were attempted between this provider and patient, however failed, due to patient having technical difficulties OR patient did not have access to video capability.  We continued and completed visit with audio only.   Persons Participating in Visit: Patient.  AWV Questionnaire: Yes: Patient Medicare AWV questionnaire was completed by the patient on 06/18/2023; I have confirmed that all information answered by patient is correct and no changes since this date.  Cardiac Risk Factors include: advanced age (>9men, >23 women);diabetes mellitus;hypertension     Objective:     Today's Vitals   There is no height or weight on file to calculate BMI.     06/18/2023   10:00 AM 06/12/2022   10:07 AM 05/31/2021   11:24 AM 05/25/2020    8:41 AM 05/19/2019    2:57 PM 04/30/2018    8:54 AM 02/10/2017    8:39 AM  Advanced Directives  Does Patient Have a Medical Advance Directive? Yes Yes Yes Yes Yes Yes No   Type of Estate agent of [place];Living will Healthcare Power of [place];Living will Healthcare Power of [place];Living will Healthcare Power of [place];Living will Healthcare Power of [place];Living will Healthcare Power of [place];Living will   Copy of Healthcare Power of Attorney in Chart? No - copy requested No - copy requested No - copy requested No - copy requested No - copy requested No - copy requested   Would patient like information on creating a medical advance directive?       No - Patient declined    Current Medications (verified) Outpatient Encounter Medications as of 06/18/2023  Medication Sig   acyclovir (ZOVIRAX) 400 MG tablet Take 400 mg by mouth as needed (for outbreaks). 1 tablet BID as needed.   acyclovir cream (ZOVIRAX) 5 % Apply 1 application topically as needed.   amLODipine  (NORVASC ) 2.5 MG tablet TAKE 1 TABLET BY MOUTH EVERY DAY   aspirin  EC 81 MG tablet Take 81 mg by mouth daily. Swallow whole.   atorvastatin  (LIPITOR) 40 MG tablet TAKE 1 TABLET BY MOUTH EVERY DAY   Cholecalciferol (VITAMIN D3) 50 MCG (2000 UT) capsule Take 1 capsule by mouth daily.    clotrimazole  (GYNE-LOTRIMIN ) 1 % vaginal cream PLACE 1 APPLICATORFUL VAGINALLY AT BEDTIME.   levothyroxine  (SYNTHROID ) 112 MCG tablet One tab po daily M-F and 1/2 tablet on Saturdays and Sundays   Biotin [address] MCG TABS Take by mouth. daily   No facility-administered encounter medications on file as of 06/18/2023.    Allergies (verified) Nitrofurantoin and Pneumococcal vaccines   History: Past Medical History:  Diagnosis Date  Arthritis    Depression    GERD (gastroesophageal reflux disease)    occ   Hypothyroidism    Past Surgical History:  Procedure Laterality Date   CYST EXCISION     ovary  30+ yrs   PARTIAL KNEE ARTHROPLASTY Right 02/10/2017   Procedure: RIGHT UNICOMPARTMENTAL KNEE;  Surgeon: Christie Cox, MD;  Location: [facility] OR;  Service: Orthopedics;  Laterality: Right;    THYROID  LOBECTOMY     Family History  Problem Relation Age of Onset   Emphysema Father    Social History   Socioeconomic History   Marital status: Widowed    Spouse name: Not on file   Number of children: 4   Years of education: Not on file   Highest education level: 12th grade  Occupational History   Occupation: retired  Tobacco Use   Smoking status: Never   Smokeless tobacco: Never  Vaping Use   Vaping status: Never Used  Substance and Sexual Activity   Alcohol use: Not Currently    Comment: occasional   Drug use: No   Sexual activity: Not Currently  Other Topics Concern   Not on file  Social History Narrative   Not on file   Social Drivers of Health   Financial Resource Strain: Low Risk  (06/18/2023)   Overall Financial Resource Strain (CARDIA)    Difficulty of Paying Living Expenses: Not hard at all  Recent Concern: Financial Resource Strain - Medium Risk (04/24/2023)   Overall Financial Resource Strain (CARDIA)    Difficulty of Paying Living Expenses: Somewhat hard  Food Insecurity: No Food Insecurity (06/18/2023)   Hunger Vital Sign    Worried About Running Out of Food in the Last Year: Never true    Ran Out of Food in the Last Year: Never true  Recent Concern: Food Insecurity - Food Insecurity Present (04/24/2023)   Hunger Vital Sign    Worried About Running Out of Food in the Last Year: Sometimes true    Ran Out of Food in the Last Year: Never true  Transportation Needs: No Transportation Needs (06/18/2023)   PRAPARE - Administrator, Civil Service (Medical): No    Lack of Transportation (Non-Medical): No  Physical Activity: Inactive (06/18/2023)   Exercise Vital Sign    Days of Exercise per Week: 0 days    Minutes of Exercise per Session: 0 min  Stress: No Stress Concern Present (06/18/2023)   [employer] of Occupational Health - Occupational Stress Questionnaire    Feeling of Stress : Only a little  Recent Concern: Stress - Stress Concern  Present (04/24/2023)   [employer] of Occupational Health - Occupational Stress Questionnaire    Feeling of Stress : To some extent  Social Connections: Moderately Isolated (06/18/2023)   Social Connection and Isolation Panel [NHANES]    Frequency of Communication with Friends and Family: Once a week    Frequency of Social Gatherings with Friends and Family: Three times a week    Attends Religious Services: More than 4 times per year    Active Member of Clubs or Organizations: No    Attends Banker Meetings: Never    Marital Status: Widowed    Tobacco Counseling Counseling given: Not Answered    Clinical Intake:  Pre-visit preparation completed: Yes  Pain : No/denies pain     Nutritional Risks: None Diabetes: Yes CBG done?: No Did pt. bring in CBG monitor from home?: No  Lab Results  Component Value Date  HGBA1C 6.1 (H) 04/24/2023   HGBA1C 6.1 (H) 01/02/2023   HGBA1C 5.9 (H) 08/22/2022     How often do you need to have someone help you when you read instructions, pamphlets, or other written materials from your doctor or pharmacy?: 1 - Never  Interpreter Needed?: No  Information entered by :: NAllen LPN   Activities of Daily Living     06/18/2023    9:54 AM  In your present state of health, do you have any difficulty performing the following activities:  Hearing? 0  Vision? 0  Difficulty concentrating or making decisions? 0  Walking or climbing stairs? 1  Comment bad knee  Dressing or bathing? 0  Doing errands, shopping? 0  Preparing Food and eating ? N  Using the Toilet? N  In the past six months, have you accidently leaked urine? N  Do you have problems with loss of bowel control? N  Managing your Medications? N  Managing your Finances? N  Housekeeping or managing your Housekeeping? N    Patient Care Team: Cleave Curling, MD as PCP - General (Internal Medicine) Devin Foerster, MD as Consulting Physician (Ophthalmology) Jannelle Memory, RPH-CPP (Pharmacist) Adriana Albany, LCSW as [facility] Care Management (Licensed Clinical Social Worker) Geronimo Krabbe, [facility] as Pharmacist (Pharmacist)  I have updated your Care Teams any recent Medical Services you may have received from other providers in the past year.     Assessment:    This is a routine wellness examination for Joanne Reese.  Hearing/Vision screen Hearing Screening - Comments:: Denies hearing issues Vision Screening - Comments:: Regular eye exams, [facility]   Goals Addressed             This Visit's Progress    Patient Stated       06/18/2023, wants to start walking again       Depression Screen     06/18/2023   10:01 AM 04/24/2023    9:19 AM 01/02/2023    9:28 AM 10/23/2022    9:52 AM 06/12/2022   10:08 AM 02/28/2022    9:23 AM 08/09/2021    2:44 PM  PHQ 2/9 Scores  PHQ - 2 Score 1 0 0 0 2 0 0  PHQ- 9 Score 1 0 0 0 3      Fall Risk     06/18/2023    9:54 AM 04/24/2023    9:19 AM 01/02/2023    9:28 AM 06/12/2022   10:07 AM 02/28/2022    9:23 AM  Fall Risk   Falls in the past year? 0 0 0 0 0  Number falls in past yr: 0 0 0 0 0  Injury with Fall? 0 0 0 0 0  Risk for fall due to : Medication side effect No Fall Risks No Fall Risks No Fall Risks;Medication side effect No Fall Risks  Follow up Falls prevention discussed;Falls evaluation completed Falls evaluation completed Falls evaluation completed Falls prevention discussed;Education provided;Falls evaluation completed Falls evaluation completed    MEDICARE RISK AT HOME:  Medicare Risk at Home Any stairs in or around the home?: (Patient-Rptd) Yes If so, are there any without handrails?: (Patient-Rptd) Yes Home free of loose throw rugs in walkways, pet beds, electrical cords, etc?: (Patient-Rptd) No Adequate lighting in your home to reduce risk of falls?: (Patient-Rptd) Yes Life alert?: (Patient-Rptd) No Use of a cane, walker or w/c?: (Patient-Rptd) No Grab bars in the bathroom?:  (Patient-Rptd) No Shower chair or bench in shower?: (Patient-Rptd)  No Elevated toilet seat or a handicapped toilet?: (Patient-Rptd) No  TIMED UP AND GO:  Was the test performed?  No  Cognitive Function: 6CIT completed        06/18/2023   10:01 AM 06/12/2022   10:09 AM 05/31/2021   11:25 AM 05/25/2020    8:42 AM 05/19/2019    2:59 PM  6CIT Screen  What Year? 0 points 0 points 0 points 0 points 0 points  What month? 0 points 0 points 0 points 0 points 0 points  What time? 0 points 0 points 0 points 0 points 0 points  Count back from 20 0 points 0 points 0 points 0 points 0 points  Months in reverse 0 points 0 points 0 points 0 points 0 points  Repeat phrase 0 points 2 points 2 points 2 points 0 points  Total Score 0 points 2 points 2 points 2 points 0 points    Immunizations Immunization History  Administered Date(s) Administered   Fluad Quad(high Dose 65+) 09/30/2018, 09/23/2019   Influenza Split 10/14/2017   Influenza, High Dose Seasonal PF 10/26/2020, 10/22/2021   Influenza-Unspecified 10/22/2021   Moderna Covid-19 Fall Seasonal Vaccine 90yrs & older 12/31/2022   Moderna Covid-19 Vaccine Bivalent Booster 7yrs & up 11/01/2020, 10/22/2021   Moderna Sars-Covid-2 Vaccination 02/25/2019, 03/22/2019, 11/16/2019, 05/16/2020   Pneumococcal Polysaccharide-23 06/05/2012   Tdap 11/26/2012    Screening Tests Health Maintenance  Topic Date Due   Zoster Vaccines- Shingrix (1 of 2) 07/24/2023 (Originally 03/06/1964)   DTaP/Tdap/Td (2 - Td or Tdap) 01/02/2024 (Originally 11/27/2022)   Pneumonia Vaccine 32+ Years old (2 of 2 - PCV) 04/23/2024 (Originally 06/05/2013)   COVID-19 Vaccine (8 - Moderna risk 2024-25 season) 07/01/2023   OPHTHALMOLOGY EXAM  07/10/2023   INFLUENZA VACCINE  08/15/2023   Diabetic kidney evaluation - Urine ACR  08/22/2023   FOOT EXAM  08/22/2023   HEMOGLOBIN A1C  10/24/2023   Diabetic kidney evaluation - eGFR measurement  04/23/2024   Medicare Annual Wellness  (AWV)  06/17/2024   DEXA SCAN  Completed   Hepatitis C Screening  Completed   HPV VACCINES  Aged Out   Meningococcal B Vaccine  Aged Out   Colonoscopy  Discontinued    Health Maintenance  There are no preventive care reminders to display for this patient.  Health Maintenance Items Addressed: Mammogram is scheduled in next 3-6 weeks  Additional Screening:  Vision Screening: Recommended annual ophthalmology exams for early detection of glaucoma and other disorders of the eye. Would you like a referral to an eye doctor? No    Dental Screening: Recommended annual dental exams for proper oral hygiene  Community Resource Referral / Chronic Care Management: CRR required this visit?  No   CCM required this visit?  No   Plan:    I have personally reviewed and noted the following in the patient's chart:   Medical and social history Use of alcohol, tobacco or illicit drugs  Current medications and supplements including opioid prescriptions. Patient is not currently taking opioid prescriptions. Functional ability and status Nutritional status Physical activity Advanced directives List of other physicians Hospitalizations, surgeries, and ER visits in previous 12 months Vitals Screenings to include cognitive, depression, and falls Referrals and appointments  In addition, I have reviewed and discussed with patient certain preventive protocols, quality metrics, and best practice recommendations. A written personalized care plan for preventive services as well as general preventive health recommendations were provided to patient.   Areatha Beecham, LPN  06/18/2023   After Visit Summary: (MyChart) Due to this being a telephonic visit, the after visit summary with patients personalized plan was offered to patient via MyChart   Notes: Nothing significant to report at this time.

## 2023-06-18 NOTE — Patient Instructions (Signed)
 Joanne Reese , Thank you for taking time out of your busy schedule to complete your Annual Wellness Visit with me. I enjoyed our conversation and look forward to speaking with you again next year. I, as well as your care team,  appreciate your ongoing commitment to your health goals. Please review the following plan we discussed and let me know if I can assist you in the future. Your Game plan/ To Do List    Referrals: If you haven't heard from the office you've been referred to, please reach out to them at the phone provided.  N/a Follow up Visits: Next Medicare AWV with our clinical staff: 07/28/2024 at 10:00   Have you seen your provider in the last 6 months (3 months if uncontrolled diabetes)? Yes Next Office Visit with your provider: 08/28/2023 at 8:40  Clinician Recommendations:  Aim for 30 minutes of exercise or brisk walking, 6-8 glasses of water, and 5 servings of fruits and vegetables each day.       This is a list of the screening recommended for you and due dates:  Health Maintenance  Topic Date Due   Zoster (Shingles) Vaccine (1 of 2) 07/24/2023*   DTaP/Tdap/Td vaccine (2 - Td or Tdap) 01/02/2024*   Pneumonia Vaccine (2 of 2 - PCV) 04/23/2024*   COVID-19 Vaccine (8 - Moderna risk 2024-25 season) 07/01/2023   Eye exam for diabetics  07/10/2023   Flu Shot  08/15/2023   Yearly kidney health urinalysis for diabetes  08/22/2023   Complete foot exam   08/22/2023   Hemoglobin A1C  10/24/2023   Yearly kidney function blood test for diabetes  04/23/2024   Medicare Annual Wellness Visit  06/17/2024   DEXA scan (bone density measurement)  Completed   Hepatitis C Screening  Completed   HPV Vaccine  Aged Out   Meningitis B Vaccine  Aged Out   Colon Cancer Screening  Discontinued  *Topic was postponed. The date shown is not the original due date.    Advanced directives: (Copy Requested) Please bring a copy of your health care power of attorney and living will to the office to be added  to your chart at your convenience. You can mail to [facility] [address] [place], [place] [address] or email to [email or web address] Advance Care Planning is important because it:  [x]  Makes sure you receive the medical care that is consistent with your values, goals, and preferences  [x]  It provides guidance to your family and loved ones and reduces their decisional burden about whether or not they are making the right decisions based on your wishes.  Follow the link provided in your after visit summary or read over the paperwork we have mailed to you to help you started getting your Advance Directives in place. If you need assistance in completing these, please reach out to us  so that we can help you!  See attachments for Preventive Care and Fall Prevention Tips.

## 2023-07-02 LAB — HM DEXA SCAN

## 2023-07-07 ENCOUNTER — Encounter: Payer: Self-pay | Admitting: Internal Medicine

## 2023-07-07 ENCOUNTER — Ambulatory Visit: Payer: Self-pay | Admitting: Internal Medicine

## 2023-07-24 LAB — OPHTHALMOLOGY REPORT-SCANNED

## 2023-08-13 LAB — HM MAMMOGRAPHY

## 2023-08-15 HISTORY — PX: CARPAL TUNNEL RELEASE: SHX101

## 2023-08-28 ENCOUNTER — Encounter: Payer: Self-pay | Admitting: Internal Medicine

## 2023-08-28 ENCOUNTER — Ambulatory Visit: Payer: Self-pay | Admitting: Internal Medicine

## 2023-08-28 VITALS — BP 128/80 | HR 83 | Temp 98.3°F | Ht 61.0 in | Wt 197.6 lb

## 2023-08-28 DIAGNOSIS — Z0000 Encounter for general adult medical examination without abnormal findings: Secondary | ICD-10-CM

## 2023-08-28 DIAGNOSIS — E7800 Pure hypercholesterolemia, unspecified: Secondary | ICD-10-CM

## 2023-08-28 DIAGNOSIS — I700 Atherosclerosis of aorta: Secondary | ICD-10-CM

## 2023-08-28 DIAGNOSIS — I119 Hypertensive heart disease without heart failure: Secondary | ICD-10-CM | POA: Diagnosis not present

## 2023-08-28 DIAGNOSIS — R7303 Prediabetes: Secondary | ICD-10-CM | POA: Diagnosis not present

## 2023-08-28 DIAGNOSIS — M8580 Other specified disorders of bone density and structure, unspecified site: Secondary | ICD-10-CM

## 2023-08-28 DIAGNOSIS — E66812 Obesity, class 2: Secondary | ICD-10-CM

## 2023-08-28 DIAGNOSIS — Z6837 Body mass index (BMI) 37.0-37.9, adult: Secondary | ICD-10-CM

## 2023-08-28 DIAGNOSIS — E039 Hypothyroidism, unspecified: Secondary | ICD-10-CM

## 2023-08-28 LAB — POCT URINALYSIS DIP (CLINITEK)
Bilirubin, UA: NEGATIVE
Blood, UA: NEGATIVE
Glucose, UA: NEGATIVE mg/dL
Ketones, POC UA: NEGATIVE mg/dL
Nitrite, UA: NEGATIVE
POC PROTEIN,UA: NEGATIVE
Spec Grav, UA: 1.02 (ref 1.010–1.025)
Urobilinogen, UA: 0.2 U/dL
pH, UA: 6 (ref 5.0–8.0)

## 2023-08-28 MED ORDER — AMLODIPINE BESYLATE 2.5 MG PO TABS: 2.5000 mg | ORAL_TABLET | Freq: Every day | ORAL | 1 refills | Status: AC

## 2023-08-28 MED ORDER — ATORVASTATIN CALCIUM 40 MG PO TABS: 40.0000 mg | ORAL_TABLET | Freq: Every day | ORAL | 1 refills | Status: AC

## 2023-08-28 NOTE — Patient Instructions (Addendum)
 [place]   Health Maintenance, Female Adopting a healthy lifestyle and getting preventive care are important in promoting health and wellness. Ask your health care provider about: The right schedule for you to have regular tests and exams. Things you can do on your own to prevent diseases and keep yourself healthy. What should I know about diet, weight, and exercise? Eat a healthy diet  Eat a diet that includes plenty of vegetables, fruits, low-fat dairy products, and lean protein. Do not eat a lot of foods that are high in solid fats, added sugars, or sodium. Maintain a healthy weight Body mass index (BMI) is used to identify weight problems. It estimates body fat based on height and weight. Your health care provider can help determine your BMI and help you achieve or maintain a healthy weight. Get regular exercise Get regular exercise. This is one of the most important things you can do for your health. Most adults should: Exercise for at least 150 minutes each week. The exercise should increase your heart rate and make you sweat (moderate-intensity exercise). Do strengthening exercises at least twice a week. This is in addition to the moderate-intensity exercise. Spend less time sitting. Even light physical activity can be beneficial. Watch cholesterol and blood lipids Have your blood tested for lipids and cholesterol at 76 years of age, then have this test every 5 years. Have your cholesterol levels checked more often if: Your lipid or cholesterol levels are high. You are older than 76 years of age. You are at high risk for heart disease. What should I know about cancer screening? Depending on your health history and family history, you may need to have cancer screening at various ages. This may include screening for: Breast cancer. Cervical cancer. Colorectal cancer. Skin cancer. Lung cancer. What should I know about heart disease, diabetes, and high blood pressure? Blood  pressure and heart disease High blood pressure causes heart disease and increases the risk of stroke. This is more likely to develop in people who have high blood pressure readings or are overweight. Have your blood pressure checked: Every 3-5 years if you are 46-37 years of age. Every year if you are 44 years old or older. Diabetes Have regular diabetes screenings. This checks your fasting blood sugar level. Have the screening done: Once every three years after age 33 if you are at a normal weight and have a low risk for diabetes. More often and at a younger age if you are overweight or have a high risk for diabetes. What should I know about preventing infection? Hepatitis B If you have a higher risk for hepatitis B, you should be screened for this virus. Talk with your health care provider to find out if you are at risk for hepatitis B infection. Hepatitis C Testing is recommended for: Everyone born from [birth year] through 1963. Anyone with known risk factors for hepatitis C. Sexually transmitted infections (STIs) Get screened for STIs, including gonorrhea and chlamydia, if: You are sexually active and are younger than 76 years of age. You are older than 76 years of age and your health care provider tells you that you are at risk for this type of infection. Your sexual activity has changed since you were last screened, and you are at increased risk for chlamydia or gonorrhea. Ask your health care provider if you are at risk. Ask your health care provider about whether you are at high risk for HIV. Your health care provider may recommend a prescription medicine to  help prevent HIV infection. If you choose to take medicine to prevent HIV, you should first get tested for HIV. You should then be tested every 3 months for as long as you are taking the medicine. Pregnancy If you are about to stop having your period (premenopausal) and you may become pregnant, seek counseling before you get  pregnant. Take 400 to 800 micrograms (mcg) of folic acid every day if you become pregnant. Ask for birth control (contraception) if you want to prevent pregnancy. Osteoporosis and menopause Osteoporosis is a disease in which the bones lose minerals and strength with aging. This can result in bone fractures. If you are 52 years old or older, or if you are at risk for osteoporosis and fractures, ask your health care provider if you should: Be screened for bone loss. Take a calcium  or vitamin D supplement to lower your risk of fractures. Be given hormone replacement therapy (HRT) to treat symptoms of menopause. Follow these instructions at home: Alcohol use Do not drink alcohol if: Your health care provider tells you not to drink. You are pregnant, may be pregnant, or are planning to become pregnant. If you drink alcohol: Limit how much you have to: 0-1 drink a day. Know how much alcohol is in your drink. In the [place] one drink equals one 12 oz bottle of beer (355 mL), one 5 oz glass of wine (148 mL), or one 1 oz glass of hard liquor (44 mL). Lifestyle Do not use any products that contain nicotine or tobacco. These products include cigarettes, chewing tobacco, and vaping devices, such as e-cigarettes. If you need help quitting, ask your health care provider. Do not use street drugs. Do not share needles. Ask your health care provider for help if you need support or information about quitting drugs. General instructions Schedule regular health, dental, and eye exams. Stay current with your vaccines. Tell your health care provider if: You often feel depressed. You have ever been abused or do not feel safe at home. Summary Adopting a healthy lifestyle and getting preventive care are important in promoting health and wellness. Follow your health care provider's instructions about healthy diet, exercising, and getting tested or screened for diseases. Follow your health care provider's  instructions on monitoring your cholesterol and blood pressure. This information is not intended to replace advice given to you by your health care provider. Make sure you discuss any questions you have with your health care provider. Document Revised: 05/22/2020 Document Reviewed: 05/22/2020 Elsevier Patient Education  2024 [employer].

## 2023-08-28 NOTE — Progress Notes (Signed)
 xxI,Victoria ONEIDA Lemmings, CMA,acting as a Neurosurgeon for Joanne LOISE Slocumb, MD.,have documented all relevant documentation on the behalf of Joanne LOISE Slocumb, MD,as directed by  Joanne LOISE Slocumb, MD while in the presence of Joanne LOISE Slocumb, MD.  Subjective:  Patient ID: Joanne Reese , female    DOB: 01/23/1945 , 78 y.o.   MRN: [id number]  Chief Complaint  Patient presents with   Annual Exam    Patient presents today for annual exam. She reports compliance with medications. Denies headache, chest pain & sob. She asked for blood sugar to be taken: 128. Letter sent to Dr Octavia for DM eye exam.    Hypertension   Prediabetes   Hyperlipidemia   Hypothyroidism    HPI Discussed the use of AI scribe software for clinical note transcription with the patient, who gave verbal consent to proceed.  History of Present Illness [name] is a 79 year old female who presents for a routine physical and blood pressure check.  She is currently taking Norvasc  (amlodipine ) 2.5 mg daily, aspirin , atorvastatin  40 mg daily, and levothyroxine  Monday through Friday with a half dose on weekends. She has a surplus of levothyroxine  due to an additional prescription. No specific concerns are reported at this time.  She has a history of osteopenia and reports that a recent bone scan showed thinning of the bone. She does not engage in regular exercise due to the heat and finds treadmill walking unappealing. She wants to lose weight but notes the lack of safe appetite-suppressing medications.  She underwent carpal tunnel release surgery on her left hand on August 1st. Prior to the surgery, she experienced significant itching in her hands, which disrupted her sleep. After surgery, she was told by her surgeon that her hand looked really good and that she did not need to keep a bandage on all the time.  Her recent mammogram was conducted at a preferred facility due to its modernity and consistent radiologist  availability. She also notes a change in her thyroid  medication appearance due to a change in supplier.  In her social history, she plans to visit her daughter in [place]  in September and again in October to see her first great-grandchild, born on July 31st. She recently visited [address].   Hypertension This is a chronic problem. The current episode started more than 1 year ago. The problem has been rapidly improving since onset. The problem is controlled. Risk factors for coronary artery disease include dyslipidemia and post-menopausal state. Past treatments include calcium  channel blockers. The current treatment provides moderate improvement.     Past Medical History:  Diagnosis Date   Arthritis    Depression    GERD (gastroesophageal reflux disease)    occ   Hypothyroidism      Family History  Problem Relation Age of Onset   Emphysema Father      Current Outpatient Medications:    acyclovir (ZOVIRAX) 400 MG tablet, Take 400 mg by mouth as needed (for outbreaks). 1 tablet BID as needed., Disp: , Rfl:    acyclovir cream (ZOVIRAX) 5 %, Apply 1 application topically as needed., Disp: , Rfl:    aspirin  EC 81 MG tablet, Take 81 mg by mouth daily. Swallow whole., Disp: , Rfl:    Cholecalciferol (VITAMIN D3) 50 MCG (2000 UT) capsule, Take 1 capsule by mouth daily. , Disp: , Rfl:    clotrimazole  (GYNE-LOTRIMIN ) 1 % vaginal cream, PLACE 1 APPLICATORFUL VAGINALLY AT BEDTIME., Disp: 45 g,  Rfl: 0   levothyroxine  (SYNTHROID ) 112 MCG tablet, One tab po daily M-F and 1/2 tablet on Saturdays and Sundays, Disp: 90 tablet, Rfl: 1   amLODipine  (NORVASC ) 2.5 MG tablet, Take 1 tablet (2.5 mg total) by mouth daily., Disp: 90 tablet, Rfl: 1   atorvastatin  (LIPITOR) 40 MG tablet, Take 1 tablet (40 mg total) by mouth daily., Disp: 90 tablet, Rfl: 1   Allergies  Allergen Reactions   Nitrofurantoin Anaphylaxis   Pneumococcal Vaccines Other (See Comments)    LOCAL REACTION Redness & Pain  around injection site  Pneumoxvac 23 (active) per patient report     Review of Systems  Constitutional: Negative.   HENT: Negative.    Eyes: Negative.   Respiratory: Negative.    Cardiovascular: Negative.   Gastrointestinal: Negative.   Endocrine: Negative.   Genitourinary: Negative.   Musculoskeletal: Negative.   Skin: Negative.   Allergic/Immunologic: Negative.   Neurological: Negative.   Hematological: Negative.   Psychiatric/Behavioral: Negative.       Today's Vitals   08/28/23 0900  BP: 128/80  Pulse: 83  Temp: 98.3 F (36.8 C)  SpO2: 98%  Weight: 197 lb 9.6 oz (89.6 kg)  Height: 5' 1 (1.549 m)   Body mass index is 37.34 kg/m.  Wt Readings from Last 3 Encounters:  08/28/23 197 lb 9.6 oz (89.6 kg)  04/24/23 190 lb 9.6 oz (86.5 kg)  01/02/23 179 lb 12.8 oz (81.6 kg)     Objective:  Physical Exam Vitals and nursing note reviewed.  Constitutional:      Appearance: Normal appearance.  HENT:     Head: Normocephalic and atraumatic.     Right Ear: Tympanic membrane, ear canal and external ear normal.     Left Ear: Tympanic membrane, ear canal and external ear normal.     Nose: Nose normal.     Mouth/Throat:     Mouth: Mucous membranes are moist.     Pharynx: Oropharynx is clear.  Eyes:     Extraocular Movements: Extraocular movements intact.     Conjunctiva/sclera: Conjunctivae normal.     Pupils: Pupils are equal, round, and reactive to light.  Cardiovascular:     Rate and Rhythm: Normal rate and regular rhythm.     Pulses: Normal pulses.     Heart sounds: Normal heart sounds.  Pulmonary:     Effort: Pulmonary effort is normal.     Breath sounds: Normal breath sounds.  Chest:  Breasts:    Tanner Score is 5.     Right: Normal.     Left: Normal.  Abdominal:     General: Abdomen is flat. Bowel sounds are normal.     Palpations: Abdomen is soft.  Genitourinary:    Comments: deferred Musculoskeletal:        General: Normal range of motion.      Cervical back: Normal range of motion and neck supple.  Feet:     Right foot:     Protective Sensation: 5 sites tested.  5 sites sensed.     Left foot:     Protective Sensation: 5 sites tested.  5 sites sensed.  Skin:    General: Skin is warm and dry.     Comments: Tanned skin  Neurological:     General: No focal deficit present.     Mental Status: She is alert and oriented to person, place, and time.  Psychiatric:        Mood and Affect: Mood normal.  Behavior: Behavior normal.         Assessment And Plan:  Encounter for general adult medical examination w/o abnormal findings Assessment & Plan: A full exam was performed.  Importance of monthly self breast exams was discussed with the patient.  PATIENT IS ADVISED TO GET 30-45 MINUTES REGULAR EXERCISE NO LESS THAN FOUR TO FIVE DAYS PER WEEK - BOTH WEIGHTBEARING EXERCISES AND AEROBIC ARE RECOMMENDED.  PATIENT IS ADVISED TO FOLLOW A HEALTHY DIET WITH AT LEAST SIX FRUITS/VEGGIES PER DAY, DECREASE INTAKE OF RED MEAT, AND TO INCREASE FISH INTAKE TO TWO DAYS PER WEEK.  MEATS/FISH SHOULD NOT BE FRIED, BAKED OR BROILED IS PREFERABLE.  IT IS ALSO IMPORTANT TO CUT BACK ON YOUR SUGAR INTAKE. PLEASE AVOID ANYTHING WITH ADDED SUGAR, CORN SYRUP OR OTHER SWEETENERS. IF YOU MUST USE A SWEETENER, YOU CAN TRY STEVIA. IT IS ALSO IMPORTANT TO AVOID ARTIFICIALLY SWEETENERS AND DIET BEVERAGES. LASTLY, I SUGGEST WEARING SPF 50 SUNSCREEN ON EXPOSED PARTS AND ESPECIALLY WHEN IN THE DIRECT SUNLIGHT FOR AN EXTENDED PERIOD OF TIME.  PLEASE AVOID FAST FOOD RESTAURANTS AND INCREASE YOUR WATER INTAKE.    Hypertensive heart disease without heart failure Assessment & Plan: Chronic, controlled.  EKG performed, NSR w/o acute changes.   Blood pressure managed with amlodipine  2.5 mg daily. - Refill amlodipine  2.5 mg daily prescription at [facility] on [address]. - Follow low sodium diet.  - Follow up in six months.   Orders: -     CBC -     CMP14+EGFR -     Lipid  panel -     POCT URINALYSIS DIP (CLINITEK) -     Microalbumin / creatinine urine ratio -     EKG 12-Lead  Atherosclerosis of aorta (HCC) Assessment & Plan: Chronic, LDL goal is less than 70.  She will continue with atorvastatin  40mg  daily. She is encouraged to follow a heart healthy lifestyle.    Prediabetes -     CMP14+EGFR -     Hemoglobin A1c  Pure hypercholesterolemia Assessment & Plan: Managed with atorvastatin  40 mg daily. Efficacy to be assessed with upcoming lab work. - Refill atorvastatin  40 mg daily prescription. - Order liver and kidney function tests and cholesterol panel.  Orders: -     Lipid panel -     TSH  Primary hypothyroidism Assessment & Plan: Managed with levothyroxine , dosed Monday through Friday with a half dose on weekends. Awaiting lab results for potential adjustments. Surplus medication due to additional prescription from endocrinologist, Dr. Faythe. - Order thyroid  function tests. - Send thyroid  lab results to endocrinologist, Dr. Faythe.  Orders: -     TSH -     T4, free  Osteopenia, unspecified location Assessment & Plan: Confirmed by recent bone scan. She does not engage in regular exercise due to heat and finds treadmill walking unappealing. - Encourage indoor exercises to improve bone health.   Class 2 severe obesity due to excess calories with serious comorbidity and body mass index (BMI) of 37.0 to 37.9 in adult [facility]) Assessment & Plan: She is encouraged to strive for BMI less than 30 to decrease cardiac risk. Advised to aim for at least 150 minutes of exercise per week.    Other orders -     amLODIPine  Besylate; Take 1 tablet (2.5 mg total) by mouth daily.  Dispense: 90 tablet; Refill: 1 -     Atorvastatin  Calcium ; Take 1 tablet (40 mg total) by mouth daily.  Dispense: 90 tablet; Refill: 1  Return for 1 YEAR HM, 4 MONTH PREDM F/U.SABRA  Patient was given opportunity to ask questions. Patient verbalized understanding of the plan and was  able to repeat key elements of the plan. All questions were answered to their satisfaction.   I, Joanne LOISE Slocumb, MD, have reviewed all documentation for this visit. The documentation on 08/28/23 for the exam, diagnosis, procedures, and orders are all accurate and complete.   IF YOU HAVE BEEN REFERRED TO A SPECIALIST, IT MAY TAKE 1-2 WEEKS TO SCHEDULE/PROCESS THE REFERRAL. IF YOU HAVE NOT HEARD FROM US /SPECIALIST IN TWO WEEKS, PLEASE GIVE US  A CALL AT [phone].   THE PATIENT IS ENCOURAGED TO PRACTICE SOCIAL DISTANCING DUE TO THE COVID-19 PANDEMIC.

## 2023-08-29 ENCOUNTER — Ambulatory Visit: Payer: Self-pay | Admitting: Internal Medicine

## 2023-08-30 LAB — CMP14+EGFR
ALT: 28 IU/L (ref 0–32)
AST: 22 IU/L (ref 0–40)
Albumin: 4.3 g/dL (ref 3.8–4.8)
Alkaline Phosphatase: 120 IU/L (ref 44–121)
BUN/Creatinine Ratio: 11 — ABNORMAL LOW (ref 12–28)
BUN: 10 mg/dL (ref 8–27)
Bilirubin Total: 0.7 mg/dL (ref 0.0–1.2)
CO2: 24 mmol/L (ref 20–29)
Calcium: 9 mg/dL (ref 8.7–10.3)
Chloride: 104 mmol/L (ref 96–106)
Creatinine, Ser: 0.91 mg/dL (ref 0.57–1.00)
Globulin, Total: 2.7 g/dL (ref 1.5–4.5)
Glucose: 103 mg/dL — ABNORMAL HIGH (ref 70–99)
Potassium: 4.3 mmol/L (ref 3.5–5.2)
Sodium: 141 mmol/L (ref 134–144)
Total Protein: 7 g/dL (ref 6.0–8.5)
eGFR: 65 mL/min/1.73 (ref >59)

## 2023-08-30 LAB — TSH: TSH: 4.54 u[IU]/mL — ABNORMAL HIGH (ref 0.450–4.500)

## 2023-08-30 LAB — LIPID PANEL
Chol/HDL Ratio: 3.2 ratio (ref 0.0–4.4)
Cholesterol, Total: 159 mg/dL (ref 100–199)
HDL: 49 mg/dL (ref >39)
LDL Chol Calc (NIH): 84 mg/dL (ref 0–99)
Triglycerides: 148 mg/dL (ref 0–149)
VLDL Cholesterol Cal: 26 mg/dL (ref 5–40)

## 2023-08-30 LAB — CBC
Hematocrit: 46.4 % (ref 34.0–46.6)
Hemoglobin: 14.6 g/dL (ref 11.1–15.9)
MCH: 28.5 pg (ref 26.6–33.0)
MCHC: 31.5 g/dL (ref 31.5–35.7)
MCV: 90 fL (ref 79–97)
Platelets: 233 x10E3/uL (ref 150–450)
RBC: 5.13 x10E6/uL (ref 3.77–5.28)
RDW: 13.2 % (ref 11.7–15.4)
WBC: 6.8 x10E3/uL (ref 3.4–10.8)

## 2023-08-30 LAB — HEMOGLOBIN A1C
Est. average glucose Bld gHb Est-mCnc: 123 mg/dL
Hgb A1c MFr Bld: 5.9 % — ABNORMAL HIGH (ref 4.8–5.6)

## 2023-08-30 LAB — T4, FREE: Free T4: 1.47 ng/dL (ref 0.82–1.77)

## 2023-08-30 LAB — MICROALBUMIN / CREATININE URINE RATIO
Creatinine, Urine: 148.3 mg/dL
Microalb/Creat Ratio: 5 mg/g{creat} (ref 0–29)
Microalbumin, Urine: 7.2 ug/mL

## 2023-09-06 DIAGNOSIS — M8580 Other specified disorders of bone density and structure, unspecified site: Secondary | ICD-10-CM | POA: Insufficient documentation

## 2023-09-06 NOTE — Assessment & Plan Note (Signed)
 She is encouraged to strive for BMI less than 30 to decrease cardiac risk. Advised to aim for at least 150 minutes of exercise per week.

## 2023-09-06 NOTE — Assessment & Plan Note (Signed)

## 2023-09-06 NOTE — Assessment & Plan Note (Signed)
 Managed with atorvastatin  40 mg daily. Efficacy to be assessed with upcoming lab work. - Refill atorvastatin  40 mg daily prescription. - Order liver and kidney function tests and cholesterol panel.

## 2023-09-06 NOTE — Assessment & Plan Note (Signed)
Chronic, LDL goal is less than 70.  She will continue with atorvastatin 40mg  daily. She is encouraged to follow a heart healthy lifestyle.

## 2023-09-06 NOTE — Assessment & Plan Note (Signed)
 Confirmed by recent bone scan. She does not engage in regular exercise due to heat and finds treadmill walking unappealing. - Encourage indoor exercises to improve bone health.

## 2023-09-06 NOTE — Assessment & Plan Note (Signed)
 Managed with levothyroxine , dosed Monday through Friday with a half dose on weekends. Awaiting lab results for potential adjustments. Surplus medication due to additional prescription from endocrinologist, Dr. Faythe. - Order thyroid  function tests. - Send thyroid  lab results to endocrinologist, Dr. Faythe.

## 2023-09-06 NOTE — Assessment & Plan Note (Signed)
 Chronic, controlled.  EKG performed, NSR w/o acute changes.   Blood pressure managed with amlodipine  2.5 mg daily. - Refill amlodipine  2.5 mg daily [facility] at [facility] [address]. - Follow low sodium diet.  - Follow up in six months.

## 2023-09-10 ENCOUNTER — Ambulatory Visit (INDEPENDENT_AMBULATORY_CARE_PROVIDER_SITE_OTHER)

## 2023-09-10 ENCOUNTER — Ambulatory Visit: Admitting: Podiatry

## 2023-09-10 DIAGNOSIS — M79671 Pain in right foot: Secondary | ICD-10-CM

## 2023-09-10 DIAGNOSIS — M19071 Primary osteoarthritis, right ankle and foot: Secondary | ICD-10-CM | POA: Diagnosis not present

## 2023-09-10 DIAGNOSIS — M7751 Other enthesopathy of right foot: Secondary | ICD-10-CM

## 2023-09-10 DIAGNOSIS — M25571 Pain in right ankle and joints of right foot: Secondary | ICD-10-CM

## 2023-09-10 MED ORDER — TRIAMCINOLONE ACETONIDE 10 MG/ML IJ SUSP
10.0000 mg | Freq: Once | INTRAMUSCULAR | Status: AC
  Administered 2023-09-10: 10 mg

## 2023-09-10 NOTE — Progress Notes (Addendum)
 Patient presents complaining of pain on the right foot began to hurt her several days ago.  She noticed a swelling on top of the foot.  Never had any really severe pain in that area before.  Did not notice any redness or ecchymosis.  She does have osteopenia.  Physical exam:  General appearance: Pleasant, and in no acute distress. AOx3.  Vascular: Pedal pulses: DP 2/4 bilaterally, PT 2/4 bilaterally.  Mild edema lower legs bilaterally. Capillary fill time immediate bilaterally.  Neurological: Light touch intact feet bilaterally.  Normal Achilles reflex bilaterally.  No clonus or spasticity noted.   Dermatologic:   Skin normal temperature bilaterally.  Skin normal color, tone, and texture bilaterally.   Musculoskeletal: Tenderness to palpation over the second tarsometatarsal joint and with range of motion right.  This slight tenderness of the third tarsometatarsal joint right.  There is palpation in the sinus tarsi.  No tenderness range of motion subtalar joint.  Normal muscle strength lower extremity.  Radiographs: 3 views foot right: Severe joint space narrowing and osteophytic changes of 2nd and 3rd TMT's.  Some joint space narrowing at the first tarsometatarsal joint.  No assenting fractures or dislocations.  Plantar and posterior calcaneal spurs right.  Mild osteopenia.  No assenting bone tumors.  Diagnosis: 1.  Pain right foot. 2.  Arthralgia second tarsometatarsal joint right. 3.  Osteoarthritis midfoot right  Plan: -New patient office visit for evaluation and management level 3.  Modifier 25. - Discussed with the osteoarthritis in the midfoot leading to the joint pain that she is getting.  Most the pain seems to be at the 2 she got some in the sinus tarsi also.  Recommended good stable supportive shoes.  Gave her several recommendations.  Will also recommend some OTC foot orthoses bilaterally.  She declined these. -injected 3cc 2:1 mixture 0.5 cc Marcaine :Kenolog 10mg /31ml at  tarsometatarsal joint second right foot.   Return 2 weeks follow-up injection TMT 2 right

## 2023-09-25 ENCOUNTER — Telehealth: Payer: Self-pay | Admitting: Podiatry

## 2023-09-25 NOTE — Telephone Encounter (Signed)
 Ptc called and LVM to cancel 9/17 appt with Dr. Christine. She said she's feeling better. She said to call her back to let her know that I rcvd her message. I called her back to let her know that her appt has been cancelled

## 2023-10-01 ENCOUNTER — Ambulatory Visit: Admitting: Podiatry

## 2023-11-09 ENCOUNTER — Encounter: Payer: Self-pay | Admitting: Internal Medicine

## 2023-11-11 ENCOUNTER — Other Ambulatory Visit: Payer: Self-pay | Admitting: Internal Medicine

## 2023-11-18 ENCOUNTER — Encounter: Payer: Self-pay | Admitting: Internal Medicine

## 2023-11-19 ENCOUNTER — Other Ambulatory Visit: Payer: Self-pay

## 2023-11-19 ENCOUNTER — Telehealth: Payer: Self-pay

## 2023-11-19 DIAGNOSIS — Z23 Encounter for immunization: Secondary | ICD-10-CM

## 2023-11-19 MED ORDER — TETANUS-DIPHTH-ACELL PERTUSSIS 5-2-15.5 LF-MCG/0.5 IM SUSY: 0.5000 mL | PREFILLED_SYRINGE | Freq: Once | INTRAMUSCULAR | 0 refills | Status: AC

## 2023-11-19 MED ORDER — BOOSTRIX 5-2.5-18.5 LF-MCG/0.5 IM SUSY
0.5000 mL | PREFILLED_SYRINGE | Freq: Once | INTRAMUSCULAR | 0 refills | Status: DC
Start: 2023-11-19 — End: 2023-11-19

## 2023-11-19 NOTE — Telephone Encounter (Signed)
 Rx Benefits verified YL,RMA

## 2023-11-24 ENCOUNTER — Telehealth: Payer: Self-pay

## 2023-11-24 NOTE — Telephone Encounter (Unsigned)
 Copied from CRM [id number]. Topic: Clinical - Medical Advice >> Nov 24, 2023  5:57 PM Kevelyn M wrote: Reason for CRM: Status update for boostrix injection does not require authorization. Can be processed at pharmacy.  Call back for questions: [phone]

## 2023-12-18 ENCOUNTER — Encounter: Payer: Self-pay | Admitting: Internal Medicine

## 2024-01-01 ENCOUNTER — Encounter: Payer: Self-pay | Admitting: Internal Medicine

## 2024-01-01 ENCOUNTER — Ambulatory Visit: Payer: Self-pay | Admitting: Internal Medicine

## 2024-01-01 VITALS — BP 124/82 | HR 83 | Temp 98.3°F | Ht 61.0 in | Wt 200.6 lb

## 2024-01-01 DIAGNOSIS — R7303 Prediabetes: Secondary | ICD-10-CM

## 2024-01-01 DIAGNOSIS — I700 Atherosclerosis of aorta: Secondary | ICD-10-CM | POA: Diagnosis not present

## 2024-01-01 DIAGNOSIS — E039 Hypothyroidism, unspecified: Secondary | ICD-10-CM

## 2024-01-01 DIAGNOSIS — E7800 Pure hypercholesterolemia, unspecified: Secondary | ICD-10-CM

## 2024-01-01 DIAGNOSIS — I119 Hypertensive heart disease without heart failure: Secondary | ICD-10-CM

## 2024-01-01 NOTE — Progress Notes (Unsigned)
 I,Joanne Reese, CMA,acting as a neurosurgeon for Joanne LOISE Slocumb, MD.,have documented all relevant documentation on the behalf of Joanne LOISE Slocumb, MD,as directed by  Joanne LOISE Slocumb, MD while in the presence of Joanne LOISE Slocumb, MD.  Subjective:  Patient ID: Joanne Reese , female    DOB: August 21, 1945 , 78 y.o.   MRN: [id number]  Chief Complaint  Patient presents with   Prediabetes    Patient presents today for predm, bp & cholesterol follow up. She reports compliance with medications. Denies headache, chest pain & sob. She requests an handicap placard for left knee pain. She admits this issue is getting worse. She is extremely afraid of surgery at the moment.    Hypertension   Hyperlipidemia    HPI  Discussed the use of AI scribe software for clinical note transcription with the patient, who gave verbal consent to proceed.  History of Present Illness      Hypertension This is a chronic problem. The current episode started more than 1 year ago. The problem has been rapidly improving since onset. The problem is controlled. Risk factors for coronary artery disease include dyslipidemia, post-menopausal state and sedentary lifestyle. Past treatments include calcium  channel blockers. The current treatment provides moderate improvement.  Hyperlipidemia This is a chronic problem. The problem is controlled. Exacerbating diseases include hypothyroidism. She has no history of diabetes. Current antihyperlipidemic treatment includes statins. The current treatment provides moderate improvement of lipids.     Past Medical History:  Diagnosis Date   Arthritis    Depression    GERD (gastroesophageal reflux disease)    occ   Hypothyroidism      Family History  Problem Relation Age of Onset   Emphysema Father     Current Medications[1]   Allergies[2]   Review of Systems  Constitutional: Negative.   Respiratory: Negative.    Cardiovascular: Negative.   Gastrointestinal:  Negative.   Neurological: Negative.   Psychiatric/Behavioral: Negative.       Today's Vitals   01/01/24 1200  BP: 124/82  Pulse: 83  Temp: 98.3 F (36.8 C)  SpO2: 98%  Weight: 200 lb 9.6 oz (91 kg)  Height: 5' 1 (1.549 m)   Body mass index is 37.9 kg/m.  Wt Readings from Last 3 Encounters:  01/01/24 200 lb 9.6 oz (91 kg)  08/28/23 197 lb 9.6 oz (89.6 kg)  04/24/23 190 lb 9.6 oz (86.5 kg)    The 10-year ASCVD risk score (Arnett DK, et al., 2019) is: 37.6%   Values used to calculate the score:     Age: 68 years     Clinically relevant sex: Female     Is Non-Hispanic African American: No     Diabetic: Yes     Tobacco smoker: No     Systolic Blood Pressure: 124 mmHg     Is BP treated: Yes     HDL Cholesterol: 49 mg/dL     Total Cholesterol: 159 mg/dL  Objective:  Physical Exam Vitals and nursing note reviewed.  Constitutional:      Appearance: Normal appearance. She is obese.  HENT:     Head: Normocephalic and atraumatic.  Eyes:     Extraocular Movements: Extraocular movements intact.  Cardiovascular:     Rate and Rhythm: Normal rate and regular rhythm.     Heart sounds: Normal heart sounds.  Pulmonary:     Effort: Pulmonary effort is normal.     Breath sounds: Normal breath sounds.  Musculoskeletal:  Cervical back: Normal range of motion.  Skin:    General: Skin is warm.  Neurological:     General: No focal deficit present.     Mental Status: She is alert.  Psychiatric:        Mood and Affect: Mood normal.        Behavior: Behavior normal.         Assessment And Plan:   Assessment & Plan Prediabetes  Hypertensive heart disease without heart failure  Atherosclerosis of aorta  Pure hypercholesterolemia  Primary hypothyroidism  Morbid obesity due to excess calories (HCC)   Orders Placed This Encounter  Procedures   CMP14+EGFR   Hemoglobin A1c   TSH + free T4   Amb Ref to Medical Weight Management     Return for 4 month dm f/u.SABRA   Patient was given opportunity to ask questions. Patient verbalized understanding of the plan and was able to repeat key elements of the plan. All questions were answered to their satisfaction.    I, Joanne LOISE Slocumb, MD, have reviewed all documentation for this visit. The documentation on 01/01/2024 for the exam, diagnosis, procedures, and orders are all accurate and complete.   IF YOU HAVE BEEN REFERRED TO A SPECIALIST, IT MAY TAKE 1-2 WEEKS TO SCHEDULE/PROCESS THE REFERRAL. IF YOU HAVE NOT HEARD FROM US /SPECIALIST IN TWO WEEKS, PLEASE GIVE US  A CALL AT [phone].       [1]  Current Outpatient Medications:    acyclovir (ZOVIRAX) 400 MG tablet, Take 400 mg by mouth as needed (for outbreaks). 1 tablet BID as needed., Disp: , Rfl:    acyclovir cream (ZOVIRAX) 5 %, Apply 1 application topically as needed., Disp: , Rfl:    amLODipine  (NORVASC ) 2.5 MG tablet, Take 1 tablet (2.5 mg total) by mouth daily., Disp: 90 tablet, Rfl: 1   aspirin  EC 81 MG tablet, Take 81 mg by mouth daily. Swallow whole., Disp: , Rfl:    atorvastatin  (LIPITOR) 40 MG tablet, Take 1 tablet (40 mg total) by mouth daily., Disp: 90 tablet, Rfl: 1   Cholecalciferol (VITAMIN D3) 50 MCG (2000 UT) capsule, Take 1 capsule by mouth daily. , Disp: , Rfl:    clotrimazole  (GYNE-LOTRIMIN ) 1 % vaginal cream, PLACE 1 APPLICATORFUL VAGINALLY AT BEDTIME., Disp: 45 g, Rfl: 0   levothyroxine  (SYNTHROID ) 112 MCG tablet, One tab po daily M-F and 1/2 tablet on Saturdays and Sundays, Disp: 90 tablet, Rfl: 1 [2] Allergies Allergen Reactions   Nitrofurantoin Anaphylaxis   Pneumococcal Vaccines Other (See Comments)    LOCAL REACTION Redness & Pain around injection site  Pneumoxvac 23 (active) per patient report

## 2024-01-01 NOTE — Patient Instructions (Signed)
 Prediabetes: What to Know Prediabetes is when your blood sugar, also called glucose, is at a higher level than normal but not high enough for you to be diagnosed with type 2 diabetes (type 2 diabetes mellitus). Having prediabetes puts you at risk for getting type 2 diabetes. By making some healthy changes, you may be able to prevent or delay getting type 2 diabetes. This is important because type 2 diabetes can lead to serious problems. Some of these include: Heart disease. Stroke. Blindness. Kidney disease. Depression. Poor blood flow in the feet and legs. In very bad cases, this could lead to having a leg removed by surgery (amputation). What are the causes? The exact cause of prediabetes isn't known. It may result from insulin resistance. Insulin resistance happens when cells in the body don't respond properly to insulin that the body makes. This can cause too much sugar to build up in the blood. High blood sugar, also called hyperglycemia, can develop. What increases the risk? Having a family member with type 2 diabetes. Being older than 76 years of age. Having had a temporary form of diabetes during a pregnancy. This is called gestational diabetes. Having had polycystic ovary syndrome (PCOS). Being overweight or obese. Being inactive and not getting much exercise. Having a history of heart disease. This may include problems with cholesterol levels, high levels of blood fats, or high blood pressure. What are the signs or symptoms? You may have no symptoms. If you do have symptoms, they may include: Increased hunger. Increased thirst. Needing to pee more often. Changes in how you see, like blurry vision. Feeling tired. How is this diagnosed? Prediabetes can be diagnosed with blood tests that check your blood sugar. One or more of these tests may be done: A fasting blood glucose (FBG) test. You won't be allowed to eat (you will fast) for at least 8 hours before a blood sample is  taken. An A1C blood test, also called a hemoglobin A1C test. This test shows information about blood sugar levels over the past 2?3 months. An oral glucose tolerance test (OGTT). This test measures your blood sugar at two points in time: After you haven't eaten for a while. This is your baseline level. Two hours after you drink a beverage that has sugar in it. You may be diagnosed with prediabetes if: Your FBG is 100?125 mg/dL (1.6-1.0 mmol/L). Your A1C level is 5.7?6.4% (39-46 mmol/mol). Your OGTT result is 140?199 mg/dL (9.6-04 mmol/L). These blood tests may need to be done again to be sure of the diagnosis. How is this treated? Treatment may include making changes to your diet and lifestyle. These changes can help lower your blood sugar and keep you from getting type 2 diabetes. In some cases, medicine may be given to help lower your risk. Follow these instructions at home: Eating and drinking  Eat and drink as told. Follow a healthy meal plan. This includes eating lean proteins, whole grains, legumes, fresh fruits and vegetables, low-fat dairy products, and healthy fats. Meet with an expert in healthy eating called a dietitian. This person can help create a healthy eating plan that's right for you. Lifestyle Do moderate-intensity exercise. Do this for at least 30 minutes a day on 5 or more days each week, or as told by your health care provider. A mix of activities may be best. Good choices include brisk walking, swimming, biking, and weight lifting. Try to lose weight if your provider says it's OK. Losing 5-7% of your body weight can  help reverse insulin resistance. Do not drink alcohol if: Your provider tells you not to drink. You're pregnant, may be pregnant, or plan to become pregnant. If you drink alcohol: Limit how much you have to: 0-1 drink a day if you're female. 0-2 drinks a day if you're female. Know how much alcohol is in your drink. In the [place] one drink is one 12 oz  bottle of beer (355 mL), one 5 oz glass of wine (148 mL), or one 1 oz glass of hard liquor (44 mL). General instructions Take medicines only as told. You may be given medicines that help lower the risk of type 2 diabetes. Do not smoke, vape, or use nicotine or tobacco. Where to find more information American Diabetes Association: diabetes.org/about-diabetes/prediabetes Academy of Nutrition and Dietetics: eatright.org American Heart Association: Go to ThisJobs.cz. Click the search icon. Type "prediabetes" in the search box. Contact a health care provider if: You have any of these symptoms: Increased hunger. Peeing more often than usual. Increased thirst. Feeling tired. Changes in how you see, like blurry vision. Feeling like you may throw up. Throwing up. Get help right away if: You have shortness of breath. You feel confused. This information is not intended to replace advice given to you by your health care provider. Make sure you discuss any questions you have with your health care provider. Document Revised: 08/04/2022 Document Reviewed: 08/04/2022 Elsevier Patient Education  2024 [employer].

## 2024-01-02 LAB — HEMOGLOBIN A1C
Est. average glucose Bld gHb Est-mCnc: 126 mg/dL
Hgb A1c MFr Bld: 6 % — ABNORMAL HIGH (ref 4.8–5.6)

## 2024-01-02 LAB — CMP14+EGFR
ALT: 20 IU/L (ref 0–32)
AST: 22 IU/L (ref 0–40)
Albumin: 4.1 g/dL (ref 3.8–4.8)
Alkaline Phosphatase: 124 IU/L (ref 49–135)
BUN/Creatinine Ratio: 16 (ref 12–28)
BUN: 12 mg/dL (ref 8–27)
Bilirubin Total: 0.7 mg/dL (ref 0.0–1.2)
CO2: 23 mmol/L (ref 20–29)
Calcium: 8.7 mg/dL (ref 8.7–10.3)
Chloride: 102 mmol/L (ref 96–106)
Creatinine, Ser: 0.76 mg/dL (ref 0.57–1.00)
Globulin, Total: 2.5 g/dL (ref 1.5–4.5)
Glucose: 86 mg/dL (ref 70–99)
Potassium: 4.2 mmol/L (ref 3.5–5.2)
Sodium: 139 mmol/L (ref 134–144)
Total Protein: 6.6 g/dL (ref 6.0–8.5)
eGFR: 81 mL/min/1.73

## 2024-01-02 LAB — TSH+FREE T4
Free T4: 1.33 ng/dL (ref 0.82–1.77)
TSH: 7.24 u[IU]/mL — ABNORMAL HIGH (ref 0.450–4.500)

## 2024-01-04 NOTE — Assessment & Plan Note (Signed)
Chronic, LDL goal is less than 70.  She will continue with atorvastatin 40mg  daily. She is encouraged to follow a heart healthy lifestyle.

## 2024-01-04 NOTE — Assessment & Plan Note (Signed)
 She has had one A1c 6.5 or higher, she does not wish to take Farxiga  due to insurance issues. Will consider another medication if she has another A1c greater than 6.5.  - Encouraged to exercise regularly and follow a diet free of refined carbs - Previous labs reviewed, her A1c has been elevated in the past. I will check an A1c today. Reminded to avoid refined sugars including sugary drinks/foods and processed meats including bacon, sausages and deli meats.

## 2024-01-04 NOTE — Assessment & Plan Note (Signed)
 BMI 37 with underlying HTN, hyperlipidemia and prediabetes. Morbid obesity with difficulty in weight management. Discussed weight loss clinic attendance and potential cash pay for medications. - Referred to [facility] for metabolic rate testing and meal planning. - Discussed potential cash pay for weight loss medications and new pill available in January.

## 2024-01-04 NOTE — Assessment & Plan Note (Signed)
 Managed with atorvastatin 40 mg daily.

## 2024-01-04 NOTE — Assessment & Plan Note (Signed)
 Previous thyroid  function test indicated suboptimal control. Emphasized importance of thyroid  function for weight loss. - She is currently taking 112mcg M-F and 1/2 tab on Sat/Sun.  - Ordered thyroid  function test to assess current thyroid  status. - Continue current levothyroxine  regimen with half dose on weekends.

## 2024-01-04 NOTE — Assessment & Plan Note (Signed)
 Chronic, fair control.  Goal BP < 130/80 .  Blood pressure managed with amlodipine  2.5 mg daily. - Refill amlodipine  2.5 mg daily prescription at [facility] on [address]. - Follow low sodium diet.  - Follow up in six months.

## 2024-01-07 ENCOUNTER — Ambulatory Visit: Payer: Self-pay | Admitting: Internal Medicine

## 2024-01-07 ENCOUNTER — Encounter: Payer: Self-pay | Admitting: Internal Medicine

## 2024-01-07 DIAGNOSIS — E039 Hypothyroidism, unspecified: Secondary | ICD-10-CM

## 2024-01-14 ENCOUNTER — Encounter (INDEPENDENT_AMBULATORY_CARE_PROVIDER_SITE_OTHER): Payer: Self-pay

## 2024-02-20 ENCOUNTER — Other Ambulatory Visit: Payer: Self-pay | Admitting: Internal Medicine

## 2024-03-10 ENCOUNTER — Other Ambulatory Visit: Payer: Self-pay

## 2024-05-05 ENCOUNTER — Ambulatory Visit: Payer: Self-pay | Admitting: Internal Medicine

## 2024-07-28 ENCOUNTER — Ambulatory Visit: Payer: Self-pay

## 2024-09-01 ENCOUNTER — Encounter: Payer: Self-pay | Admitting: Internal Medicine
